# Patient Record
Sex: Female | Born: 1970 | Race: White | Hispanic: No | Smoking: Former smoker
Health system: Southern US, Community
[De-identification: ages and names within clinical notes are randomized; demographics above are authoritative.]

## PROBLEM LIST (undated history)

## (undated) DIAGNOSIS — G1221 Amyotrophic lateral sclerosis: Secondary | ICD-10-CM

## (undated) DIAGNOSIS — Z931 Gastrostomy status: Secondary | ICD-10-CM

## (undated) DIAGNOSIS — Z930 Tracheostomy status: Secondary | ICD-10-CM

---

## 2006-02-09 ENCOUNTER — Ambulatory Visit: Payer: Self-pay | Admitting: Family Medicine

## 2006-04-04 ENCOUNTER — Ambulatory Visit: Payer: Self-pay | Admitting: Obstetrics and Gynecology

## 2006-04-04 ENCOUNTER — Other Ambulatory Visit: Payer: Self-pay

## 2006-04-04 IMAGING — CR DG CHEST 2V
1 series · 2 of 2 positions shown · non-contrast
Comparison: none

REASON FOR EXAM: Altered mental status
COMMENTS:

PROCEDURE:     DXR - DXR CHEST PA (OR AP) AND LATERAL  - [DATE] [DATE]
RESULT:     COPD changes and a pectus excavatum deformity are noted. There
is no focal infiltrate evident. There is no effusion or pneumothorax.

[Series 1: view not recorded · 0.17mm/px · 2 of 2 slices shown]
[im 1/2]
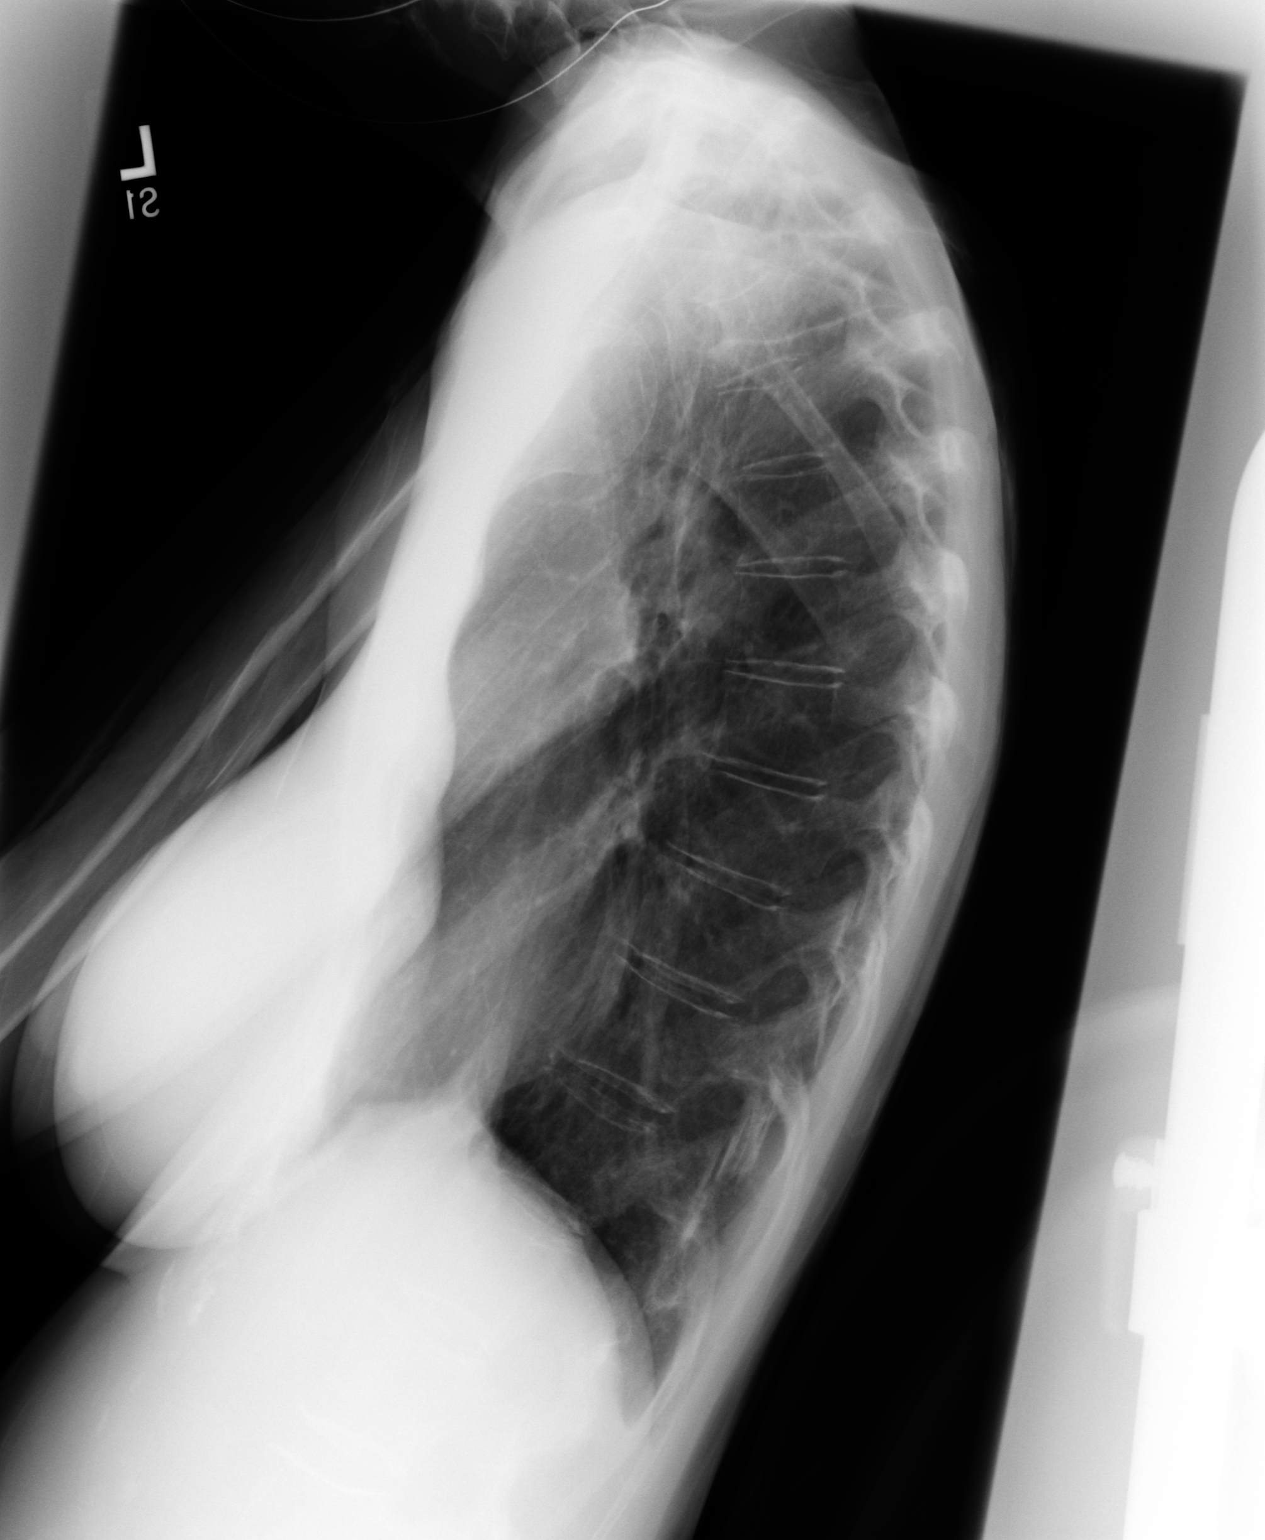
[im 2/2]
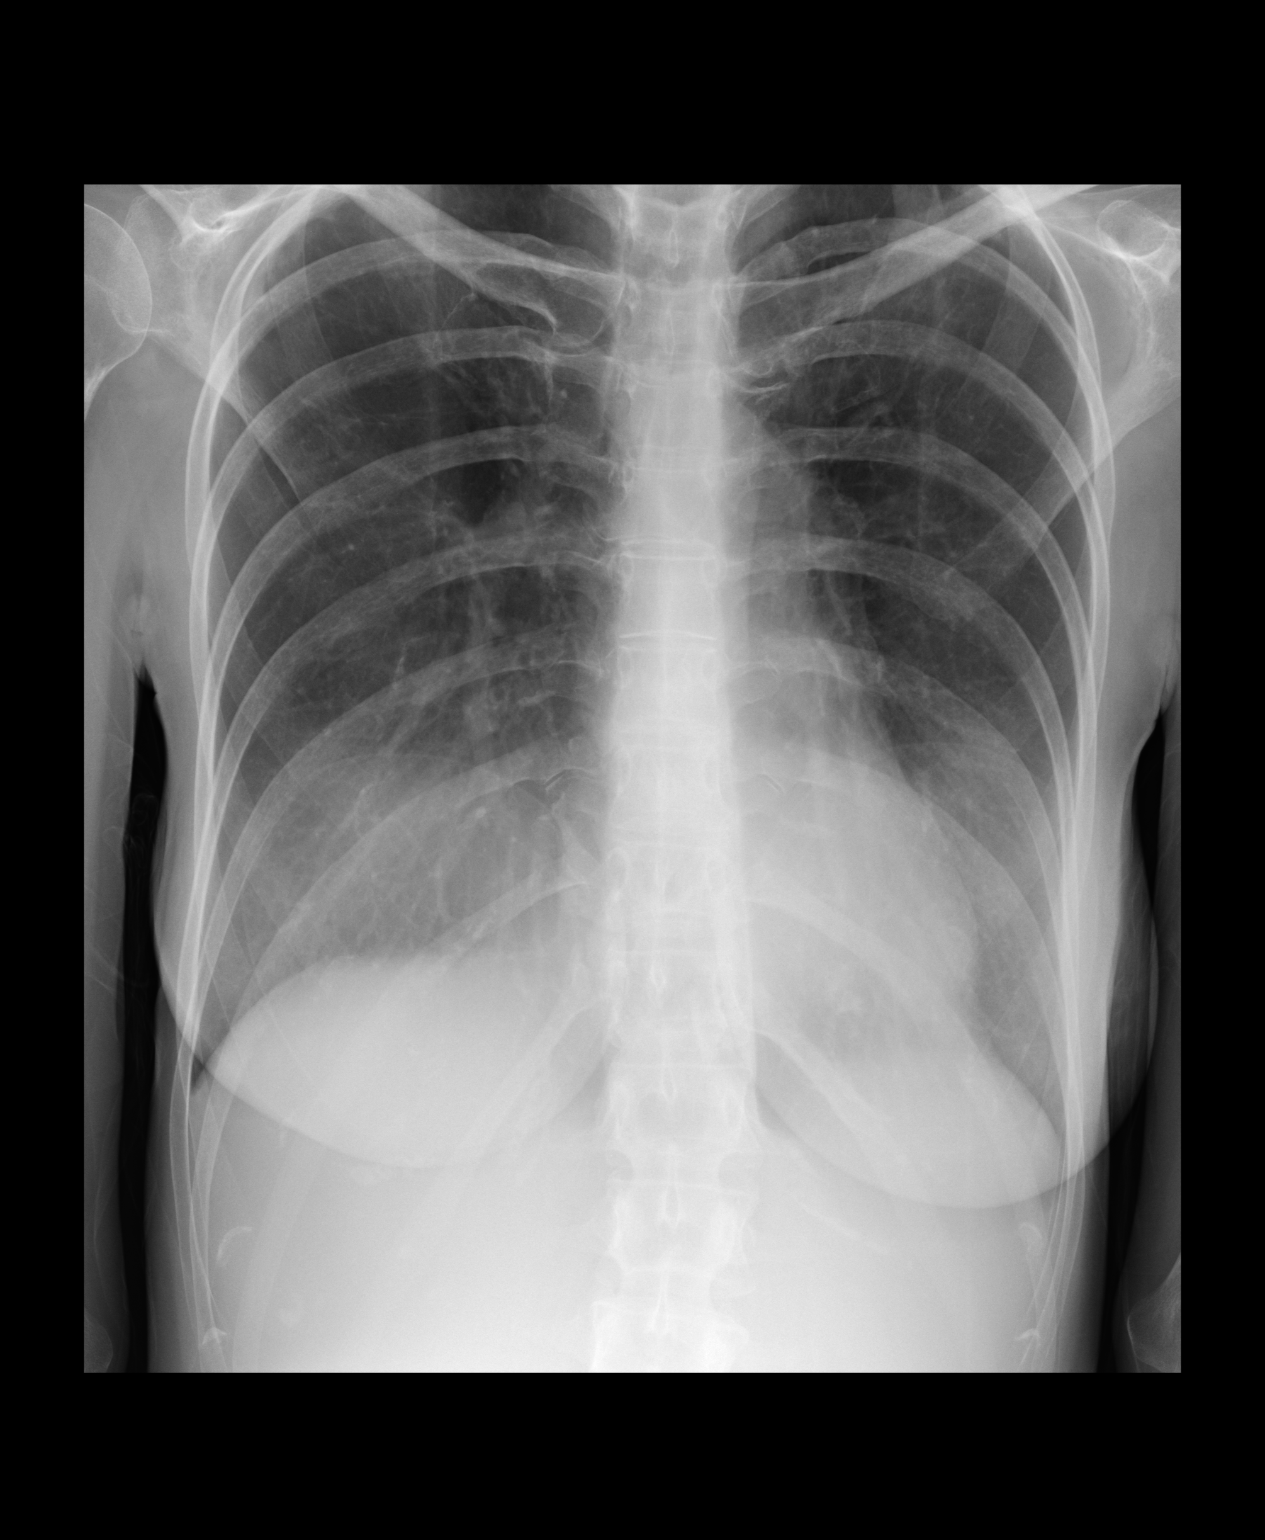

[2 of 2 positions shown; findings below may reference images not displayed]

IMPRESSION: 1.     Changes secondary to COPD and pectus excavatum deformity noted.
2.     There appears to be a calcified rim of a RIGHT breast implant as well
as possible LEFT breast implant demonstrated.

Thank you for this opportunity to contribute to the care of your patient.

ADDENDUM: [DATE]

Reason for exam in above report should read: ALS.

## 2006-04-13 ENCOUNTER — Ambulatory Visit: Payer: Self-pay | Admitting: Obstetrics and Gynecology

## 2008-09-23 ENCOUNTER — Inpatient Hospital Stay: Payer: Self-pay | Admitting: Internal Medicine

## 2010-05-04 ENCOUNTER — Ambulatory Visit: Payer: Self-pay | Admitting: Family Medicine

## 2010-06-30 ENCOUNTER — Emergency Department: Payer: Self-pay | Admitting: Emergency Medicine

## 2013-04-10 ENCOUNTER — Ambulatory Visit: Payer: Self-pay | Admitting: Internal Medicine

## 2013-05-01 ENCOUNTER — Inpatient Hospital Stay: Payer: Self-pay | Admitting: Student

## 2013-05-01 LAB — URINALYSIS, COMPLETE
Blood: NEGATIVE
Glucose,UR: NEGATIVE mg/dL (ref 0–75)
Leukocyte Esterase: NEGATIVE
Nitrite: NEGATIVE
Protein: 30
RBC,UR: 5 /HPF (ref 0–5)

## 2013-05-01 LAB — CBC
HGB: 15.3 g/dL (ref 12.0–16.0)
MCHC: 32.3 g/dL (ref 32.0–36.0)
MCV: 98 fL (ref 80–100)
Platelet: 225 10*3/uL (ref 150–440)
RDW: 12.3 % (ref 11.5–14.5)
WBC: 18.2 10*3/uL — ABNORMAL HIGH (ref 3.6–11.0)

## 2013-05-01 LAB — COMPREHENSIVE METABOLIC PANEL
Alkaline Phosphatase: 87 U/L (ref 50–136)
BUN: 17 mg/dL (ref 7–18)
Chloride: 99 mmol/L (ref 98–107)
Co2: 34 mmol/L — ABNORMAL HIGH (ref 21–32)
Creatinine: 0.29 mg/dL — ABNORMAL LOW (ref 0.60–1.30)
EGFR (African American): >60
Osmolality: 279 (ref 275–301)
Potassium: 4.3 mmol/L (ref 3.5–5.1)
SGOT(AST): 17 U/L (ref 15–37)
SGPT (ALT): 20 U/L (ref 12–78)
Sodium: 139 mmol/L (ref 136–145)
Total Protein: 8.3 g/dL — ABNORMAL HIGH (ref 6.4–8.2)

## 2013-05-02 LAB — CBC WITH DIFFERENTIAL/PLATELET
Bands: 9 %
HCT: 30.8 % — ABNORMAL LOW (ref 35.0–47.0)
HGB: 10.4 g/dL — ABNORMAL LOW (ref 12.0–16.0)
Lymphocytes: 18 %
MCH: 32.5 pg (ref 26.0–34.0)
MCHC: 33.9 g/dL (ref 32.0–36.0)
Metamyelocyte: 1 %
Monocytes: 1 %
RBC: 3.21 10*6/uL — ABNORMAL LOW (ref 3.80–5.20)
RDW: 12.1 % (ref 11.5–14.5)

## 2013-05-02 LAB — BASIC METABOLIC PANEL
Calcium, Total: 7.3 mg/dL — ABNORMAL LOW (ref 8.5–10.1)
Co2: 29 mmol/L (ref 21–32)
EGFR (African American): >60
EGFR (Non-African Amer.): >60
Glucose: 120 mg/dL — ABNORMAL HIGH (ref 65–99)
Osmolality: 291 (ref 275–301)
Potassium: 3.2 mmol/L — ABNORMAL LOW (ref 3.5–5.1)
Sodium: 145 mmol/L (ref 136–145)

## 2013-05-02 LAB — POTASSIUM: Potassium: 3.2 mmol/L — ABNORMAL LOW (ref 3.5–5.1)

## 2013-05-02 LAB — MAGNESIUM: Magnesium: 1.3 mg/dL — ABNORMAL LOW

## 2013-05-03 LAB — POTASSIUM: Potassium: 4 mmol/L (ref 3.5–5.1)

## 2013-05-03 LAB — MAGNESIUM: Magnesium: 2 mg/dL

## 2013-05-04 LAB — CBC WITH DIFFERENTIAL/PLATELET
Basophil %: 0.5 %
Eosinophil %: 0.8 %
HCT: 26.2 % — ABNORMAL LOW (ref 35.0–47.0)
HGB: 8.7 g/dL — ABNORMAL LOW (ref 12.0–16.0)
Lymphocyte %: 14.9 %
MCH: 31.6 pg (ref 26.0–34.0)
MCV: 96 fL (ref 80–100)
Monocyte #: 0.7 x10 3/mm (ref 0.2–0.9)
Monocyte %: 5.6 %
Neutrophil %: 78.2 %
Platelet: 181 10*3/uL (ref 150–440)
WBC: 12.8 10*3/uL — ABNORMAL HIGH (ref 3.6–11.0)

## 2013-05-04 LAB — BASIC METABOLIC PANEL
Anion Gap: 7 (ref 7–16)
BUN: 4 mg/dL — ABNORMAL LOW (ref 7–18)
Calcium, Total: 7.3 mg/dL — ABNORMAL LOW (ref 8.5–10.1)
Co2: 24 mmol/L (ref 21–32)
Creatinine: <0.1 mg/dL — ABNORMAL LOW (ref 0.60–1.30)
Glucose: 68 mg/dL (ref 65–99)
Osmolality: 282 (ref 275–301)
Sodium: 144 mmol/L (ref 136–145)

## 2013-05-05 LAB — URINALYSIS, COMPLETE
Bilirubin,UR: NEGATIVE
Blood: NEGATIVE
Glucose,UR: 50 mg/dL (ref 0–75)
Nitrite: NEGATIVE
Ph: 5 (ref 4.5–8.0)
Protein: NEGATIVE
RBC,UR: 4 /HPF (ref 0–5)
Squamous Epithelial: NONE SEEN

## 2013-05-05 LAB — CBC WITH DIFFERENTIAL/PLATELET
HCT: 26.1 % — ABNORMAL LOW (ref 35.0–47.0)
HGB: 9 g/dL — ABNORMAL LOW (ref 12.0–16.0)
Lymphocyte %: 20 %
MCHC: 34.4 g/dL (ref 32.0–36.0)
MCV: 95 fL (ref 80–100)
Monocyte #: 0.9 x10 3/mm (ref 0.2–0.9)
Neutrophil #: 8.2 10*3/uL — ABNORMAL HIGH (ref 1.4–6.5)
Neutrophil %: 70 %
Platelet: 228 10*3/uL (ref 150–440)
RBC: 2.76 10*6/uL — ABNORMAL LOW (ref 3.80–5.20)
RDW: 12.5 % (ref 11.5–14.5)
WBC: 11.8 10*3/uL — ABNORMAL HIGH (ref 3.6–11.0)

## 2013-05-05 LAB — BASIC METABOLIC PANEL
Calcium, Total: 7.4 mg/dL — ABNORMAL LOW (ref 8.5–10.1)
Chloride: 113 mmol/L — ABNORMAL HIGH (ref 98–107)
Co2: 23 mmol/L (ref 21–32)
Sodium: 146 mmol/L — ABNORMAL HIGH (ref 136–145)

## 2013-05-05 LAB — MAGNESIUM: Magnesium: 1.9 mg/dL

## 2013-05-05 LAB — PHOSPHORUS: Phosphorus: 2.7 mg/dL (ref 2.5–4.9)

## 2013-05-06 LAB — CBC WITH DIFFERENTIAL/PLATELET
Basophil #: 0 10*3/uL (ref 0.0–0.1)
Basophil %: 0.1 %
Eosinophil #: 0.4 10*3/uL (ref 0.0–0.7)
HCT: 28 % — ABNORMAL LOW (ref 35.0–47.0)
Lymphocyte #: 1.7 10*3/uL (ref 1.0–3.6)
Lymphocyte %: 13.6 %
MCHC: 33.6 g/dL (ref 32.0–36.0)
Monocyte %: 9.3 %
Neutrophil #: 9.5 10*3/uL — ABNORMAL HIGH (ref 1.4–6.5)
Neutrophil %: 74.2 %
Platelet: 306 10*3/uL (ref 150–440)
RBC: 2.96 10*6/uL — ABNORMAL LOW (ref 3.80–5.20)
RDW: 12.5 % (ref 11.5–14.5)
WBC: 12.8 10*3/uL — ABNORMAL HIGH (ref 3.6–11.0)

## 2013-05-06 LAB — BASIC METABOLIC PANEL
BUN: 3 mg/dL — ABNORMAL LOW (ref 7–18)
Calcium, Total: 7.6 mg/dL — ABNORMAL LOW (ref 8.5–10.1)
Glucose: 156 mg/dL — ABNORMAL HIGH (ref 65–99)
Osmolality: 283 (ref 275–301)
Sodium: 142 mmol/L (ref 136–145)

## 2013-05-06 LAB — MAGNESIUM: Magnesium: 1.6 mg/dL — ABNORMAL LOW

## 2013-05-06 LAB — ALBUMIN: Albumin: 1.7 g/dL — ABNORMAL LOW (ref 3.4–5.0)

## 2013-05-06 LAB — PHOSPHORUS: Phosphorus: 2.2 mg/dL — ABNORMAL LOW (ref 2.5–4.9)

## 2013-05-07 LAB — CBC WITH DIFFERENTIAL/PLATELET
Basophil %: 0.2 %
Eosinophil #: 0.2 10*3/uL (ref 0.0–0.7)
Eosinophil %: 0.9 %
HCT: 26.9 % — ABNORMAL LOW (ref 35.0–47.0)
Lymphocyte #: 2.1 10*3/uL (ref 1.0–3.6)
Lymphocyte %: 12 %
MCH: 31.6 pg (ref 26.0–34.0)
Monocyte %: 13.5 %
Neutrophil #: 12.6 10*3/uL — ABNORMAL HIGH (ref 1.4–6.5)
RBC: 2.85 10*6/uL — ABNORMAL LOW (ref 3.80–5.20)

## 2013-05-07 LAB — VANCOMYCIN, TROUGH: Vancomycin, Trough: 7 ug/mL — ABNORMAL LOW (ref 10–20)

## 2013-05-07 LAB — COMPREHENSIVE METABOLIC PANEL
Alkaline Phosphatase: 64 U/L (ref 50–136)
Anion Gap: 2 — ABNORMAL LOW (ref 7–16)
BUN: 10 mg/dL (ref 7–18)
Calcium, Total: 8.2 mg/dL — ABNORMAL LOW (ref 8.5–10.1)
Chloride: 105 mmol/L (ref 98–107)
Glucose: 221 mg/dL — ABNORMAL HIGH (ref 65–99)
Osmolality: 283 (ref 275–301)
Sodium: 139 mmol/L (ref 136–145)
Total Protein: 5.6 g/dL — ABNORMAL LOW (ref 6.4–8.2)

## 2013-05-07 LAB — MAGNESIUM: Magnesium: 1.9 mg/dL

## 2013-05-07 LAB — PHOSPHORUS
Phosphorus: 2.6 mg/dL (ref 2.5–4.9)
Phosphorus: 2.8 mg/dL (ref 2.5–4.9)

## 2013-05-07 LAB — CULTURE, BLOOD (SINGLE)

## 2013-05-07 LAB — POTASSIUM: Potassium: 4 mmol/L (ref 3.5–5.1)

## 2013-05-10 LAB — EXPECTORATED SPUTUM ASSESSMENT W REFEX TO RESP CULTURE

## 2013-05-11 ENCOUNTER — Ambulatory Visit: Payer: Self-pay | Admitting: Internal Medicine

## 2013-06-08 ENCOUNTER — Ambulatory Visit: Payer: Self-pay

## 2014-04-01 ENCOUNTER — Emergency Department: Payer: Self-pay | Admitting: Emergency Medicine

## 2014-04-01 LAB — BASIC METABOLIC PANEL
Anion Gap: 6 — ABNORMAL LOW (ref 7–16)
BUN: 11 mg/dL (ref 7–18)
CO2: 25 mmol/L (ref 21–32)
CREATININE: 0.33 mg/dL — AB (ref 0.60–1.30)
Calcium, Total: 9.2 mg/dL (ref 8.5–10.1)
Chloride: 110 mmol/L — ABNORMAL HIGH (ref 98–107)
EGFR (African American): >60
GLUCOSE: 88 mg/dL (ref 65–99)
Osmolality: 280 (ref 275–301)
POTASSIUM: 4 mmol/L (ref 3.5–5.1)
Sodium: 141 mmol/L (ref 136–145)

## 2014-04-01 LAB — CBC
HCT: 35.6 % (ref 35.0–47.0)
HGB: 11.4 g/dL — ABNORMAL LOW (ref 12.0–16.0)
MCH: 26.6 pg (ref 26.0–34.0)
MCHC: 32.1 g/dL (ref 32.0–36.0)
MCV: 83 fL (ref 80–100)
Platelet: 387 10*3/uL (ref 150–440)
RBC: 4.3 10*6/uL (ref 3.80–5.20)
RDW: 21.5 % — ABNORMAL HIGH (ref 11.5–14.5)
WBC: 11.2 10*3/uL — ABNORMAL HIGH (ref 3.6–11.0)

## 2014-08-04 ENCOUNTER — Other Ambulatory Visit: Payer: Self-pay

## 2014-08-10 LAB — WOUND CULTURE

## 2015-04-02 NOTE — H&P (Signed)
PATIENT NAME:  Brenda Anderson, LENHARDT MR#:  [id number] DATE OF BIRTH:  1971-12-09  DATE OF ADMISSION:  05/01/2013  PRIMARY CARE PHYSICIAN:  Dr. Dorothey Baseman   REFERRING PHYSICIAN:  Dr. Manson Passey   CHIEF COMPLAINT:  Epigastric pain radiating to the back and shortness of breath.   HISTORY OF PRESENT ILLNESS:  The patient is a 44 year old Caucasian female with past medical history of amyotrophic lateral sclerosis who is bedbound recently with worsening of symptoms of ALS. She is presenting to the ER with a chief complaint of epigastric abdominal pain and also radiating to the back pain.  According to the patient's mom who is at bedside, the patient has been nauseated for two days and feeling extremely weak associated with epigastric pain and back pain today.  The patient has received IV morphine for getting CT of the chest and abdomen by the ER physician, following which the patient became quite lethargic.  I was unable to get any history from the patient as she has just received IV morphine.  History is obtained from the old medical records and from the mom's history at bedside.  Lately her ALS has been getting worse and patient became hypoxemic last night.  The patient was sating at 86% on room air and was placed on high flow oxygen via nasal cannula and at 5 liters patient was sating at 96%.  CAT scan of the chest and abdomen has revealed small right-sided pleural effusion with adjacent atelectasis or consolidation.  As patient's white count is elevated, she was given IV antibiotics for possible aspiration pneumonia.      PAST MEDICAL HISTORY: Amyotrophic lateral sclerosis, depression, allergic rhinitis, migraine.   PAST SURGICAL HISTORY: C-section, adenoidectomy, tubal ligation, endometrial ablation, appendectomy.   ALLERGIES:  She is allergic to CIPRO.   PSYCHOSOCIAL HISTORY:  Lives at home with her mom and two children.  She got separated.  She used to smoke, but quit smoking several years ago.  No  history of alcohol or drugs.   FAMILY HISTORY:  Father had heart disease in late 74s.  Mother and grandmother had osteoporosis.  Paternal grandmother had diabetes.   REVIEW OF SYSTEMS:  CONSTITUTIONAL:  No fever, fatigue.  No weight loss, weight gain.  EYES:  No blurry vision, glaucoma, cataracts.  EARS, NOSE, THROAT:  No epistaxis, discharge, postnasal drip, sinus pain.  RESPIRATIONS:  No wheezing, hemoptysis, but positive shortness of breath.  CARDIOVASCULAR:  No chest pain.  No palpitations, orthopnea, edema or syncope.  GASTROINTESTINAL:  Status post nausea and vomiting for the past two days.  No history of diarrhea.  No abdominal pain, hematemesis or melena.  No rectal bleeding or constipation.  GENITOURINARY:  No dysuria or hematuria.  No frequent incontinence.  GYNECOLOGIC AND BREASTS:  No breast mass, vaginal discharge.  ENDOCRINE:  No polyuria, nocturia, thyroid problems.  HEMATOLOGIC AND LYMPHATIC:  No anemia, easy bruising, bleeding.  MUSCULOSKELETAL:  No joint pain in the neck, back, shoulder. NEUROLOGIC:  Denies any numbness, weakness or vertigo.  Denies any ataxia or dementia.  PSYCHIATRIC:  No history of insomnia, ADD or OCD.   PHYSICAL EXAMINATION: VITAL SIGNS:  Respirations 18, blood pressure 114/80, pulse ox 95% on 4 to 5 liters, pulse 118 per minute.  GENERAL APPEARANCE:  Not in acute distress.  Moderately built and thin-looking.  The patient is quite lethargic as she has received IV morphine for her CAT scan. HEENT:  Normocephalic, atraumatic.  No sinus tenderness.  No postnasal drip.  No  pharyngeal exudates.  NECK:  Supple.  No JVD.  No thyromegaly.  LUNGS:  Moderate air entry.  No crackles.  Positive rales and rhonchi in the right lung base.   CARDIAC:  S1, S2, normal.  Positive murmur.  GASTROINTESTINAL:  Soft.  Bowel sounds are positive in all four quadrants.  Nontender, nondistended.  No masses felt.  No hepatosplenomegaly.  Recently replaced, the G-tube is intact.    NEUROLOGIC:  The patient is very lethargic as she has just received morphine in the IV form.  Arousable to deep sternal rub or verbal commands or painful stimuli.  SKIN:  Warm to touch and dry in nature.  No lesions.  No rashes.  EXTREMITIES:  No edema.  No cyanosis.  No clubbing.  No joint effusion. MUSCULOSKELETAL:  No joint effusion, tenderness, erythema.   LABORATORY DATA AND IMAGING STUDIES:  BUN 17, creatinine 0.1, sodium 139, potassium 4.3, chloride 99, GFR greater than 60, anion gap 6, serum osmolality 279, calcium 9.3, total protein 8.3, albumin 3.6, bilirubin total 0.4, alkaline phosphatase 87, ALT and AST are within normal range.  WBC 18.2, hemoglobin 15.3, hematocrit 47.4, platelets 225,000.  CT of the chest and abdomen has revealed normal-sized heart; severe diverticulosis is present, small right-sided pleural effusion with adjacent atelectasis versus consolidation, a small amount of indeterminate perihepatic fluid.  No pulmonary embolism.   ASSESSMENT AND PLAN: 1.  Shortness of breath with hypoxemia, probably from aspiration pneumonia.  We will admit the patient to telemetry.  We will provide broad-spectrum antibiotics for possible aspiration pneumonia with Zosyn and vancomycin, oxygen via nasal cannula to keep the sats at greater than 93%.  2.  Amyotrophic lateral sclerosis.  The patient has gastric tube. Will keep th patient NPO 3.  Depression, stable.  No suicidal or homicidal ideation.   4.  Possible aspiration pneumonia, regarding which we will keep the patient nothing by mouth and we will get a bedside swallow evaluation by the speech pathologist.   5.  We will obtain sputum culture and urine culture and sensitivity.  6.  Regarding the patient's mom, she is a FULL CODE.  Mom is her medical POA.   Total time spent on admission is 55 minutes.    ____________________________ Ramonita LabAruna Boston Catarino, MD ag:ea D: 05/01/2013 04:06:22 ET T: 05/01/2013 05:30:45 ET JOB#: [id number]  cc: Ramonita LabAruna  Benny Deutschman, MD, <Dictator> Ramonita LabARUNA Vance Hochmuth MD ELECTRONICALLY SIGNED 05/10/2013 6:44

## 2015-04-02 NOTE — Discharge Summary (Signed)
PATIENT NAME:  Brenda Anderson, Brenda Anderson MR#:  [id number] DATE OF BIRTH:  May 10, 1971  DATE OF ADMISSION:  05/01/2013 DATE OF DISCHARGE:  05/07/2013  [facility]   For History of Present Illness pertaining to admission and hospitalization until May 27, please see the H and P dictated on May 22 and interim discharge summary dictated on the 27th.  CONSULTATIONS:   1.  Dr. Harvie JuniorPhifer from palliative care.  2.  Dr. Freda MunroSaadat Khan and Dr. Belia HemanKasa from pulmonary.  3.  Dr. Michela PitcherEly blood and Dr. Egbert GaribaldiBird from general surgery.  4.  Dr. Wyn Quakerew from vascular surgery.   DISCHARGE DIAGNOSES: 1.  Status post gastric ulcer perforation peritonitis.  2.  Status post laparotomy.  3.  Respiratory failure.  4.  Amyotrophic lateral sclerosis.   5.  Aspiration pneumonia on admission, complicated by ventilator-associated pneumonia.  6.  Septic shock.  7.  Hypokalemia.  8.  Hypoglycemia.  9.  Hyponatremia.  10.  Acute blood loss anemia.  11.  Severe debility due to amyotrophic lateral sclerosis.   12.  Failure of breathing off the vent.   CURRENT MEDICATIONS: Pantoprazole 40 mg IV daily, Zosyn 3.375 grams IV every 8 hours, Lovenox 40 mg subcutaneous daily, Combivent oral inhaler 8 puffs q. 4 hours, Flovent  HFA 220 mcg inhaled 2 puffs q. 12 hours, scopolamine patch, insulin drip, Precedex drip, Fentanyl drip, Levophed currently at 16 mcg, TPN.   LABORATORY, DIAGNOSTIC AND RADIOLOGIC DATA: Today's labs: Glucose was 221, 0.32, BUN 10, sodium 139, potassium 4. LFTs showed albumin of 1.7, AST 44, total protein 5.6. WBC is up trending, today at 17.2 and on the 27th was 12.8, hemoglobin 9, platelets 471. Vanc level early this morning was 7.   X-ray of the chest, portable today, shows persistent bilateral infiltrates and diffuse osteopenia.   HOSPITAL COURSE: This morning the patient did not have a fever, but remains tachycardic, 120s to 130s and hypotensive. She was on Levophed of 14 mcg. She is sedated. I  had a lengthy conversation with the patient's mother describing patient's shock, persistent respiratory failure and inability to wean off of the vent. The patient is on triple antibiotic therapy to cover ventilator-associated pneumonia. This should also cover possible peritonitis. The [facility] at this point [facility] as the patient's primary surgeon and neurologist are located there. The case was discussed with Dr. Katrine CohoGovert, who accepted the patient. [facility]. The patient overall has a very poor prognosis and likely unweanable from the vent and this was explained that the mother, who understands the situation.      TOTAL TIME SPENT: 35 minutes.   CODE STATUS: The patient is full code.   ____________________________ Krystal EatonShayiq Raylen Ken, MD sa:cc D: 05/07/2013 15:19:00 ET T: 05/07/2013 16:12:33 ET JOB#: [id number]  cc: Krystal EatonShayiq Antanasia Kaczynski, MD, <Dictator> Krystal EatonSHAYIQ Marna Weniger MD ELECTRONICALLY SIGNED 05/13/2013 20:06

## 2015-04-02 NOTE — Op Note (Signed)
PATIENT NAME:  Brenda Anderson, Brenda Anderson MR#:  [id number] DATE OF BIRTH:  12/21/1970  DATE OF PROCEDURE:  05/01/2013  PREOPERATIVE DIAGNOSIS: Acute abdomen, sepsis, perforated posterior duodenal ulcer second portion.   POSTOPERATIVE DIAGNOSIS: Acute abdomen, sepsis, perforated posterior duodenal ulcer second portion.    PROCEDURES PERFORMED: 1.  Exploratory laparotomy.  2.  Peritoneal lavage.  3.  Modified Graham patch closure of duodenal ulcer.   SURGEON: Natale LayMark Jaquanda Wickersham, MD   ASSISTANT: Ida Roguehristopher Lundquist, MD   FINDINGS: As described above.   ESTIMATED BLOOD LOSS: Minimal.   DRAINS:  1.  Foley catheter.  2.  A 19 mm Blake drain in Morison's pouch.   DESCRIPTION OF PROCEDURE: With informed consent obtained from the family, the patient was brought urgently to the operating room and positioned supine. General oral endotracheal anesthesia was induced. The patient's abdomen was widely prepped and draped with ChloraPrep solution. Timeout was observed. The abdomen was entered through a midline skin incision. A self-retaining abdominal wall retractor was placed. Generous Kocher maneuver was performed including the right kidney. There appeared to be a contained perforation within the second portion of the posterior wall of the duodenum measuring approximately 3 mm. There was a small amount of bilious staining in the retroperitoneum. This was irrigated thoroughly. There did not appear to be a large ulcer involved in this area. There was no active bleeding. The ulcer was closed with seromuscular 3-0 silk suture. A modified Graham patch was then performed by rotating adjacent retroperitoneal fat from Gerota's fascia over onto the ulcer site. This was secured in place with loosely-tied 3-0 silk sutures. An additional tongue of omentum was taken off the transverse colon and sutured in place. The abdomen was irrigated with warm normal saline and aspirated dry. Hemostasis on Gerota's fascia was obtained with point  cautery and the application of Surgicel. A 19 mm Blake drain was directed into the space and exited the right lateral abdomen. The drain site was secured with nylon suture. With lap and needle count correct x 2, the fascia was closed from the extremes of the wounds with a #1 Vicryl running with interrupted #1 Vicryls as internal retention sutures. The G-tube was secured to the anterior abdominal wall and flange. It would appear to be intact. Skin edges were reapproximated utilizing a skin stapler. Sterile occlusive dressing was placed. The patient was then subsequently taken to the recovery room in stable condition on a ventilatory circuit.    ____________________________ Redge GainerMark A. Egbert GaribaldiBird, MD mab:cb D: 05/01/2013 17:49:30 ET T: 05/01/2013 20:26:03 ET JOB#: [id number]  cc: Loraine LericheMark A. Egbert GaribaldiBird, MD, <Dictator> Raynald KempMARK A Kajuan Guyton MD ELECTRONICALLY SIGNED 05/07/2013 12:18

## 2015-04-02 NOTE — Consult Note (Signed)
CHIEF COMPLAINT and HISTORY:  Subjective/Chief Complaint SOB, poor access   History of Present Illness 44 yo WF with ALS who is admitted with hypoxia and aspiration pneumonia.  She is on BiPAP.  She has only one small peripheral IV and is on multiple IV medicines.  We are asked to place a line   PAST MEDICAL/SURGICAL HISTORY:  Past Medical History:   depression:    ALS:    appendectomy:    tonsilectomy:    breast   saline implants:   ALLERGIES:  Allergies:  Cipro: N/V  HOME MEDICATIONS:  Home Medications: Medication Instructions Status  K-Dur 10 oral tablet, extended release 1  orally once a day  Active  scopolamine 1.5 mg transdermal film, extended release 1  transdermal every 3 days  Active  mirtazapine 15 mg oral tablet 1  orally once a day (at bedtime)  Active  Senna S tablet 50 mg-8.6 mg 2 tab(s) orally once a day (at bedtime)  Active   Family and Social History:  Family History Coronary Artery Disease   Social History positive tobacco (Greater than 1 year), negative ETOH   Place of Living Home   Review of Systems:  ROS Pt not able to provide ROS  very short of breath on BiPAP   Medications/Allergies Reviewed Medications/Allergies reviewed   Physical Exam:  GEN cachectic, critically ill appearing   HEENT PERRL, dry oral mucosa   NECK No masses  trachea midline   RESP postive use of accessory muscles  rhonchi   CARD regular rate  no JVD   ABD denies tenderness  normal BS   GU foley catheter in place  clear yellow urine draining   LYMPH negative neck, negative axillae   EXTR negative cyanosis/clubbing, negative edema   SKIN No ulcers, skin turgor poor   NEURO generalized weakness present   PSYCH difficult to assess as is on BiPAP, SOB, and critically ill   LABS:  Laboratory Results: Hepatic:    22-May-14 00:38, Comprehensive Metabolic Panel  Bilirubin, Total 0.4  Alkaline Phosphatase 87  SGPT (ALT) 20  SGOT (AST) 17  Total Protein,  Serum 8.3  Albumin, Serum 3.6  Lab:    22-May-14 07:00, ABG  pH (ABG) 7.05  PCO2 120  PO2 81  FiO2 32  O2 Saturation 97.3  Specimen Site (ABG)   RT RADIAL  Specimen Type (ABG) ARTERIAL  Patient Temp (ABG) 37.0    22-May-14 08:05, ABG  pH (ABG) 7.14  PCO2 105  PO2 157  FiO2 50  Base Excess 3.1  HCO3 35.7  O2 Saturation 98.8  O2 Device BIPAP  Specimen Site (ABG)   RT RADIAL  Specimen Type (ABG) ARTERIAL  Patient Temp (ABG) 37.0  PSV 16  PEEP 5.0  Mechanical Rate 12  Routine Chem:    22-May-14 00:38, Comprehensive Metabolic Panel  Glucose, Serum 99  BUN 17  Creatinine (comp) 0.29  Sodium, Serum 139  Potassium, Serum 4.3  Chloride, Serum 99  CO2, Serum 34  Calcium (Total), Serum 9.3  Osmolality (calc) 279  eGFR (African American) >60  eGFR (Non-African American) >60  eGFR values <62m/min/1.73 m2 may be an indication of chronic  kidney disease (CKD).  Calculated eGFR is useful in patients with stable renal function.  The eGFR calculation will not be reliable in acutely ill patients  when serum creatinine is changing rapidly. It is not useful in   patients on dialysis. The eGFR calculation may not be applicable  to patients at the  low and high extremes of body sizes, pregnant  women, and vegetarians.  Anion Gap 6    22-May-14 07:00, ABG  Result Comment   - NOTIFIED OF CRITICAL VALUE   - READ-BACK PROCESS PERFORMED.   - dr Margaretmary Eddy, [phone], 05/01/2013   Result(s) reported on 01 May 2013 at 07:13AM.    22-May-14 08:05, ABG  Result Comment   - NOTIFIED OF CRITICAL VALUE   - READ-BACK PROCESS PERFORMED.   - to Dr Laurence Slate 05/01/13 0830   Result(s) reported on 01 May 2013 at 08:20AM.  Routine UA:    22-May-14 03:55, Urinalysis  Color (UA) Yellow  Clarity (UA) Hazy  Glucose (UA) Negative  Bilirubin (UA) Negative  Ketones (UA) 1+  Specific Gravity (UA) 1.015  Blood (UA) Negative  pH (UA) 5.0  Protein (UA) 30 mg/dL  Nitrite (UA) Negative  Leukocyte Esterase (UA)  Negative  Result(s) reported on 01 May 2013 at 04:41AM.  RBC (UA) 5 /HPF  WBC (UA) 3 /HPF  Bacteria (UA) TRACE  Epithelial Cells (UA) 12 /HPF  Mucous (UA) PRESENT  Result(s) reported on 01 May 2013 at 04:41AM.  Routine Hem:    22-May-14 00:38, Hemogram, Platelet Count  WBC (CBC) 18.2  RBC (CBC) 4.84  Hemoglobin (CBC) 15.3  Hematocrit (CBC) 47.4  Platelet Count (CBC) 225  Result(s) reported on 01 May 2013 at 12:47AM.  MCV 98  MCH 31.6  MCHC 32.3  RDW 12.3   RADIOLOGY:  Radiology Results: XRay:    22-May-14 00:25, Abdomen AP Only  Abdomen AP Only  REASON FOR EXAM:    g tube placement  COMMENTS:       PROCEDURE: DXR - DXR ABDOMEN AP ONLY  - May 01 2013 12:25AM     RESULT:  Comparison: 06/30/2010    Findings:  Contrast material is seen within the second and third segment of the   duodenum. The bulb of the G-tube appears to be in the region of the   gastric antrum. There is a small amount of contrast material just lateral   to the second segment of the duodenum which does not appear to be   intraluminal, and there appears to be some extraluminal contrast material   and air in this location on the subsequent CT. High density material   within the colon may be related to ingested material perihilar     diverticulosis of the colon. There is a levocurvature of the   thoracolumbar spine.    IMPRESSION:    The majority of the contrast material is seen within the duodenum.   However, there are findings concerning for small amount of extraluminal   extravasation of contrast material. Please see CT already performed.   Surgery consultation is suggested.    This was called to Dr. Phillips Climes, the hospitalist covering this   patient, at [place] hours 05/01/2013.    Dictation site: 2      Verified By: Gregor Hams, M.D., MD    22-May-14 00:25, Chest Portable Single View  Chest Portable Single View  REASON FOR EXAM:    difficulty breathing, low oxygen  saturation  COMMENTS:       PROCEDURE: DXR - DXR PORTABLE CHEST SINGLE VIEW  - May 01 2013 12:25AM     RESULT: Comparison: None.    Findings:  The heart and mediastinum are within normal limits given patient   positioning. There is mild biapical pleural-parenchymal thickening. Mild   elevation of the the right hemidiaphragm. There are  mild left basilar   opacities which may be secondary to atelectasis.    IMPRESSION:   Mild left basilar opacities may be secondary to atelectasis. Infection or     aspiration are not excluded.    Dictation site: 2        Verified By: Gregor Hams, M.D., MD  LabUnknown:    22-May-14 00:25, Abdomen AP Only  PACS Image    22-May-14 00:25, Chest Portable Single View  PACS Image    22-May-14 01:53, CT Chest, Abd, and Pelvis With Contrast  PACS Image  CT:  CT Chest, Abd, and Pelvis With Contrast  REASON FOR EXAM:    (1) chest abdomen pain history of recnt g tube   replacement O2 74%; (2) chest abd  COMMENTS:       PROCEDURE: CT  - CT CHEST ABDOMEN AND PELVIS W  - May 01 2013  1:53AM     RESULT: History: Abdominal pain. Recent G-tube placement.    Findings: Standard CT obtained under cc of Isovue-300. Bilateral breast   implants noted. Liver normal. Gallbladder nondistended. No biliary   distention. Pancreas normal. Spleen normal. Kidneys normal. Adrenals   normal aorta normal caliber. G-tube is noted with its tip in the antrum.   Noted along the lateral wall of the duodenum there is an air collection   with adjacent high density fluid most likely oral contrast. This could   represent a bowel leak. Mild perihepatic fluid noted. No bowel distention     prominent diverticulosis noted. Apical pleural-parenchymal changes noted   consistent with scarring, mild infiltrate right middle lobe and right   lung base. Thoracic aorta is unremarkable. No pulmonary emboli.   Thoracolumbar scoliosis noted. No acute bony abnormality. This report was    phoned to patient's patient's CCU nurse Eduard Clos) with instructions the   phone this report to the patient's CCU Dr. immediately.    IMPRESSION:    1. Small amount of air an probable contrast noted along the lateral   aspect of the second portion of the duodenum. This could be from bowel   perforation. Surgical consultation is suggested. Small amount of   perihepatic fluid is present.  2. Gastrostomy tube in good position.      Verified By: Osa Craver, M.D., MD   ASSESSMENT AND PLAN:  Assessment/Admission Diagnosis hypoxia aspiration pneumonia ALS   Plan We placed a line at bedisde.  Would not advance beyond about 15 cm, but at this level there was good blood return and flushed easily.   Electronic Signatures: Algernon Huxley (MD)  (Signed 22-May-14 09:36)  Authored: Chief Complaint and History, PAST MEDICAL/SURGICAL HISTORY, ALLERGIES, HOME MEDICATIONS, Family and Social History, Review of Systems, Physical Exam, LABS, RADIOLOGY, Assessment and Plan   Last Updated: 22-May-14 09:36 by Algernon Huxley (MD)

## 2015-04-02 NOTE — Consult Note (Signed)
   Comments   I spoke with pt in the presence of her mother and brother. Pt is awake, alert, oriented. I discussed the likely scenario of trach/long term vent-dependence/LTACH after sgy. Pt shakes her head that she does not want this. I then gave her the option of comfort care if she is vent-dependent post-op. She also indicates that she doesn't want this. She indicates that what she wants is to die at home. I once again gave her the choice of no sgy with trial of conservative mgmt but pt again confirms that she wants sgy.  spoke to mother and brother outside of pt's room. They would not consent to trach iin the event pt is vent-dependent and mother knows that she will likely have to make a decision to withdraw care on pt if this is the outcome. However, mother feels that pt is able to make the decision for sgy and wants to respect her wishes.   Electronic Signatures: Kinesha Auten, Harriett SineNancy (MD)  (Signed 22-May-14 15:28)  Authored: Palliative Care   Last Updated: 22-May-14 15:28 by Blayklee Mable, Harriett SineNancy (MD)

## 2015-04-02 NOTE — Op Note (Signed)
PATIENT NAME:  Brenda Anderson, Brenda Anderson MR#:  [id number] DATE OF BIRTH:  [birth year]  DATE OF PROCEDURE:  05/01/2013  PREOPERATIVE DIAGNOSES: 1.  Hypoxia.  2.  Aspiration pneumonia.  3.  Amyotrophic lateral sclerosis (ALS).  4. Malpositioned central venous catheter.   POSTOPERATIVE DIAGNOSES: 1.  Hypoxia.  2.  Aspiration pneumonia.  3.  Amyotrophic lateral sclerosis (ALS).  4. Malpositioned central venous catheter.   PROCEDURE: Fluoroscopic revision and replacement of central venous catheter.   SURGEON: Dew.   ANESTHESIA: Local.   BLOOD LOSS: Minimal.   INDICATION FOR PROCEDURE: A 44 year old white female who had a central venous catheter placed earlier today. The catheter tip was not in the superior vena cava. Instead, it was pointed upward and across into the right innominate vein from the left jugular approach. She was brought down to have this revised under fluoroscopy.   DESCRIPTION OF PROCEDURE: The patient's existing catheter and pericatheter area were sterilely prepped and draped and a sterile surgical field was created. An 0.025 wire was used to rewire the catheter. The catheter was pulled back and the wire was advanced under fluoroscopic guidance downward into the superior vena cava. The catheter was then advanced over the wire with the catheter tip parked at the cavoatrial junction and was secured to the skin at 19 cm with  3 Prolene sutures.     ____________________________ Annice NeedyJason S. Dew, MD jsd:dm D: 05/01/2013 12:40:10 ET T: 05/01/2013 14:15:17 ET JOB#: [id number]  cc: Annice NeedyJason S. Dew, MD, <Dictator> Annice NeedyJASON S DEW MD ELECTRONICALLY SIGNED 05/07/2013 13:53

## 2015-04-02 NOTE — Op Note (Signed)
PATIENT NAME:  Brenda BravoHENSLEY, Eryka L MR#:  [id number] DATE OF BIRTH:  Jul 05, 1971  DATE OF PROCEDURE:  05/01/2013  PREOPERATIVE DIAGNOSES:  1.  Aspiration pneumonia.  2.  Hypoxia requiring high-volume oxygen.  3.  Amyotrophic lateral sclerosis.   POSTOPERATIVE DIAGNOSES: 1.  Aspiration pneumonia.  2.  Hypoxia requiring high-volume oxygen.  3.  Amyotrophic lateral sclerosis.   PROCEDURES: 1.  Ultrasound guidance for vascular access to left jugular vein.  2.  Placement of a left jugular triple lumen catheter.   SURGEON:  Annice NeedyJason S Janat Tabbert, MD.  ANESTHESIA:  Local.   ESTIMATED BLOOD LOSS:  Minimal.   INDICATION FOR PROCEDURE:  A 44 year old white female with ALS who was admitted to the hospital with shortness of breath, hypoxia and an aspiration pneumonia. She has very poor venous access, requiring multiple intravenous medications, and we are asked to place a central line.   DESCRIPTION OF PROCEDURE:  The patient was laid flat in her critical care bed. Her left neck and chest were sterilely prepped and draped, and a sterile surgical field was created. The left jugular vein was visualized with ultrasound, it was felt to be patent in the neck. It was then accessed under direct ultrasound guidance without difficulty with a Seldinger needle. A J-wire was placed but would not pass beyond about 20 to 25 cm. Multiple attempts at redirecting this were unsuccessful. This included trying to redirect it over an angiocatheter and a dilator in the jugular vein for support. I then elected to place a triple-lumen catheter and secure it about 13 to 14 cm where it would withdraw blood and flushed easily with sterile saline and should the catheter tip be in an inadequate location, it could be repositioned under fluoroscopy later today. This secured at this level at about 14 cm with 3 silk sutures and a stat chest x-ray is pending.   ____________________________ Annice NeedyJason S. Dalana Pfahler, MD jsd:jm D: 05/01/2013 09:57:58  ET T: 05/01/2013 10:12:15 ET JOB#: [id number]  cc: Annice NeedyJason S. Addie Alonge, MD, <Dictator> Annice NeedyJASON S Dayveon Halley MD ELECTRONICALLY SIGNED 05/07/2013 13:52

## 2015-04-03 NOTE — Consult Note (Signed)
PATIENT NAME:  Brenda Anderson, Brenda Anderson MR#:  [id number] DATE OF BIRTH:  June 19, 1971  DATE OF CONSULTATION:  04/01/2014  REFERRING PHYSICIAN:  Governor Rooksebecca Lord, MD CONSULTING PHYSICIAN:  Ollen GrossPaul S. Willeen CassBennett, MD  REASON FOR CONSULTATION: Tracheostomy tube is stuck.   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 Last evening at the care facility she resides at, they attempted to remove the tracheostomy tube to replace it, but could not get it out. Some bleeding was encountered with the attempt to remove the tube. Usually, the tracheostomy tube is replaced every 6 to 8 weeks, but they have not managed to replace it due to a miscommunication over the past few months. She is not currently having any bleeding or respiratory distress.   PAST MEDICAL HISTORY: Depression, ALS.   MEDICATIONS: Senna 50 mg tablets 2 p.o. daily, scopolamine 1.5 mg transdermal every 3 days, mirtazapine 50 mg p.o. daily, K-Dur 1 p.o. daily.   ALLERGIES: CIPRO.   PAST SURGICAL HISTORY:  Prior appendectomy, tonsillectomy, breast implants.   REVIEW OF SYSTEMS:  Unobtainable. She is not able to talk, but she currently does not have any fever.   PHYSICAL EXAMINATION: VITAL SIGNS: Temperature is 98.7, pulse 104, respirations 12. She is 100% saturated.  GENERAL: Thin female in no acute distress with a #7 cuffed Shiley tracheostomy tube in place. HEAD AND FACE: Head is normocephalic, atraumatic. No facial skin lesions.  NECK: Supple without adenopathy or mass. There is no thyromegaly. There are no salivary gland masses. She has a tracheostomy tube in place, which is a #7 cuffed Shiley tracheostomy tube, and she is on a ventilator. There is minimal granulation tissue at the lower lip of the tracheostomy wound that I can see under the tracheostomy flange.   The cuff was let down the tracheostomy tube. The patient's airway was then suctioned  to aspirate any secretions that might have passed beyond her balloon. The tracheostomy tube was then removed. It did require some force to remove, but was not terribly difficult to remove and there was minimal bleeding from the granulation tissue.  A #6 cuffed Shiley tracheostomy tube was then placed and the cuff expanded. The patient was noted to saturate well. She was resuctioned to remove any secretions. There was some minor oozing around the tracheostomy site, but no heavy bleeding.   ASSESSMENT:   Successful change of tracheostomy tube. We did have to downsize her to a #6 Shiley trach, as a #7 cuffed tracheostomy tube was not available in this facility. The next size up, an 8 Shiley tube was available, but given difficulty changing the tube, it was not felt appropriate to attempt to place a #8 tube. The patient did feel like the tube did not fit as tightly as the 7 tube, which is expected; however, there were no difficulties ventilating her. I did offer to the family the option of them obtaining a #7 Shiley tube and following up with me in the office to see if we might be able to place it, although given her size, a #6 Shiley tube is entirely appropriate. I discussed the patient with Dr. Shaune PollackLord and she is free to be discharged.   ____________________________ Ollen GrossPaul S. Willeen CassBennett, MD psb:dmm D: 04/03/2014 17:40:00 ET T: 04/03/2014 21:04:44 ET JOB#: [id number]  cc: Ollen GrossPaul S. Willeen CassBennett, MD, <Dictator> Sandi MealyPAUL S Donnald Tabar MD ELECTRONICALLY SIGNED 04/08/2014 7:39

## 2016-08-16 ENCOUNTER — Inpatient Hospital Stay
Admission: EM | Admit: 2016-08-16 | Discharge: 2016-08-18 | DRG: 208 | Disposition: A | Discharge status: Home Health | Payer: Medicare Other | Attending: Internal Medicine | Admitting: Internal Medicine

## 2016-08-16 ENCOUNTER — Emergency Department: Payer: Medicare Other

## 2016-08-16 DIAGNOSIS — Z79899 Other long term (current) drug therapy: Secondary | ICD-10-CM

## 2016-08-16 DIAGNOSIS — J9509 Other tracheostomy complication: Secondary | ICD-10-CM

## 2016-08-16 DIAGNOSIS — Z87891 Personal history of nicotine dependence: Secondary | ICD-10-CM | POA: Diagnosis not present

## 2016-08-16 DIAGNOSIS — Z9911 Dependence on respirator [ventilator] status: Secondary | ICD-10-CM | POA: Diagnosis not present

## 2016-08-16 DIAGNOSIS — F419 Anxiety disorder, unspecified: Secondary | ICD-10-CM | POA: Diagnosis present

## 2016-08-16 DIAGNOSIS — J9611 Chronic respiratory failure with hypoxia: Secondary | ICD-10-CM | POA: Diagnosis present

## 2016-08-16 DIAGNOSIS — J9503 Malfunction of tracheostomy stoma: Principal | ICD-10-CM | POA: Diagnosis present

## 2016-08-16 DIAGNOSIS — J9601 Acute respiratory failure with hypoxia: Secondary | ICD-10-CM | POA: Diagnosis not present

## 2016-08-16 DIAGNOSIS — G1221 Amyotrophic lateral sclerosis: Secondary | ICD-10-CM | POA: Diagnosis present

## 2016-08-16 DIAGNOSIS — J9610 Chronic respiratory failure, unspecified whether with hypoxia or hypercapnia: Secondary | ICD-10-CM

## 2016-08-16 DIAGNOSIS — J398 Other specified diseases of upper respiratory tract: Secondary | ICD-10-CM | POA: Diagnosis present

## 2016-08-16 DIAGNOSIS — Z931 Gastrostomy status: Secondary | ICD-10-CM | POA: Diagnosis not present

## 2016-08-16 DIAGNOSIS — F329 Major depressive disorder, single episode, unspecified: Secondary | ICD-10-CM | POA: Diagnosis present

## 2016-08-16 DIAGNOSIS — J9621 Acute and chronic respiratory failure with hypoxia: Secondary | ICD-10-CM | POA: Diagnosis not present

## 2016-08-16 DIAGNOSIS — R140 Abdominal distension (gaseous): Secondary | ICD-10-CM

## 2016-08-16 HISTORY — DX: Gastrostomy status: Z93.1

## 2016-08-16 HISTORY — DX: Amyotrophic lateral sclerosis: G12.21

## 2016-08-16 HISTORY — DX: Tracheostomy status: Z93.0

## 2016-08-16 LAB — BLOOD GAS, ARTERIAL
Acid-Base Excess: 2.3 mmol/L — ABNORMAL HIGH (ref 0.0–2.0)
Bicarbonate: 25 mmol/L (ref 20.0–28.0)
FIO2: 0.28
MECHVT: 450 mL
Mechanical Rate: 12
O2 Saturation: 99 %
PCO2 ART: 32 mmHg (ref 32.0–48.0)
PEEP: 5 cmH2O
Patient temperature: 37
pH, Arterial: 7.5 — ABNORMAL HIGH (ref 7.350–7.450)
pO2, Arterial: 119 mmHg — ABNORMAL HIGH (ref 83.0–108.0)

## 2016-08-16 LAB — GLUCOSE, CAPILLARY: GLUCOSE-CAPILLARY: 88 mg/dL (ref 65–99)

## 2016-08-16 MED ORDER — VITAMIN C 500 MG PO TABS
250.0000 mg | ORAL_TABLET | Freq: Two times a day (BID) | ORAL | Status: DC
  Administered 2016-08-17 – 2016-08-18 (×3): 250 mg
  Filled 2016-08-16: qty 1
  Filled 2016-08-16 (×2): qty 2

## 2016-08-16 MED ORDER — ACETAMINOPHEN 500 MG PO TABS
ORAL_TABLET | ORAL | Status: AC
  Filled 2016-08-16: qty 1

## 2016-08-16 MED ORDER — ACETAMINOPHEN 500 MG PO TABS
1000.0000 mg | ORAL_TABLET | Freq: Once | ORAL | Status: AC
  Administered 2016-08-16: 1000 mg via ORAL
  Filled 2016-08-16: qty 2

## 2016-08-16 MED ORDER — ATENOLOL 25 MG PO TABS
25.0000 mg | ORAL_TABLET | Freq: Three times a day (TID) | ORAL | Status: DC
  Filled 2016-08-16: qty 1

## 2016-08-16 MED ORDER — SODIUM CHLORIDE 0.9 % IV SOLN: 250.0000 mL | INTRAVENOUS | Status: DC | PRN

## 2016-08-16 MED ORDER — ACETAMINOPHEN 160 MG/5ML PO SOLN
1000.0000 mg | Freq: Once | ORAL | Status: DC
  Filled 2016-08-16: qty 40.6

## 2016-08-16 MED ORDER — CHLORHEXIDINE GLUCONATE 0.12 % MT SOLN
15.0000 mL | Freq: Two times a day (BID) | OROMUCOSAL | Status: DC
  Administered 2016-08-17 – 2016-08-18 (×3): 15 mL via OROMUCOSAL
  Filled 2016-08-16 (×4): qty 15

## 2016-08-16 MED ORDER — ORAL CARE MOUTH RINSE
15.0000 mL | Freq: Four times a day (QID) | OROMUCOSAL | Status: DC
  Administered 2016-08-17 (×2): 15 mL via OROMUCOSAL

## 2016-08-16 MED ORDER — ACETAMINOPHEN 500 MG PO TABS
ORAL_TABLET | ORAL | Status: AC
  Filled 2016-08-16: qty 2

## 2016-08-16 MED ORDER — CLONAZEPAM 0.5 MG PO TABS
0.2500 mg | ORAL_TABLET | Freq: Two times a day (BID) | ORAL | Status: DC
  Administered 2016-08-17 – 2016-08-18 (×3): 0.25 mg
  Filled 2016-08-16 (×4): qty 1

## 2016-08-16 MED ORDER — HEPARIN SODIUM (PORCINE) 5000 UNIT/ML IJ SOLN
5000.0000 [IU] | Freq: Three times a day (TID) | INTRAMUSCULAR | Status: DC
  Administered 2016-08-17 – 2016-08-18 (×5): 5000 [IU] via SUBCUTANEOUS
  Filled 2016-08-16 (×5): qty 1

## 2016-08-16 MED ORDER — PREGABALIN 50 MG PO CAPS
50.0000 mg | ORAL_CAPSULE | Freq: Every day | ORAL | Status: DC
  Administered 2016-08-17 – 2016-08-18 (×2): 50 mg
  Filled 2016-08-16 (×2): qty 1

## 2016-08-16 MED ORDER — FERROUS FUMARATE 324 (106 FE) MG PO TABS
1.0000 | ORAL_TABLET | Freq: Every day | ORAL | Status: DC
  Administered 2016-08-18: 106 mg via ORAL
  Filled 2016-08-16: qty 1

## 2016-08-16 MED ORDER — FAMOTIDINE 40 MG/5ML PO SUSR
20.0000 mg | Freq: Two times a day (BID) | ORAL | Status: DC
  Administered 2016-08-17 – 2016-08-18 (×3): 20 mg
  Filled 2016-08-16 (×6): qty 2.5

## 2016-08-16 MED ORDER — CITALOPRAM HYDROBROMIDE 20 MG PO TABS
20.0000 mg | ORAL_TABLET | Freq: Every day | ORAL | Status: DC
  Administered 2016-08-17: 20 mg
  Filled 2016-08-16 (×2): qty 1

## 2016-08-16 NOTE — H&P (Signed)
PULMONARY / CRITICAL CARE MEDICINE   Name: Brenda Anderson MRN: [id number] DOB: Apr 30, 1971    ADMISSION DATE:  08/16/2016 CONSULTATION DATE:  08/16/16  REFERRING MD: Dr. Roxan Hockey  CHIEF COMPLAINT:  VDRF, cuff leak   HISTORY OF PRESENT ILLNESS:   Brenda Anderson is 45 year old female with PMH of ALS, Chronically Trached and pegged on home ventilator.  Patient developed cuff leak on 08/16/16.  Home care nurse reported being unable to obtain a seal with the ntrach, trach was changed but cuff leak persisted. Patient was brought by EMS to [facility]. Upon admission trach cuff was deflated,reseated,resecuredand inflated. Improved seal but patient continues to have leak. Mother seemed to be very anxious and wanted her daughter to be evaluated as inpatient and therefore PCCM team was asked to admit the patient.  PAST MEDICAL HISTORY :  She  has no past medical history on file.  PAST SURGICAL HISTORY: She  has no past surgical history on file.  Allergies  Allergen Reactions  . Other     purfume  . Ultram [Tramadol Hcl]     No current facility-administered medications on file prior to encounter.    No current outpatient prescriptions on file prior to encounter.    FAMILY HISTORY:  Her has no family status information on file.    SOCIAL HISTORY: She    REVIEW OF SYSTEMS:   Review of Systems  Constitutional: Negative for fever.  HENT: Negative for ear discharge and nosebleeds.   Eyes: Negative for photophobia and pain.  Respiratory: Negative for hemoptysis, sputum production, shortness of breath and wheezing.   Cardiovascular: Negative for palpitations, orthopnea, claudication, leg swelling and PND.  Gastrointestinal: Negative for abdominal pain, nausea and vomiting.  Genitourinary: Negative for flank pain, frequency, hematuria and urgency.  Neurological: Negative for tingling, tremors, sensory change and speech change.  Psychiatric/Behavioral: Negative for hallucinations, substance abuse  and suicidal ideas.      SUBJECTIVE:  Patient has persistent cuffleak despite changing the trach twice.  VITAL SIGNS: BP (!) 85/64   Pulse 80   Temp 98 F (36.7 C) (Oral)   Resp 18   Wt 134 lb (60.8 kg)   SpO2 98%   HEMODYNAMICS:    VENTILATOR SETTINGS: Vent Mode: AC FiO2 (%):  [35 %] 35 % Set Rate:  [12 bmp] 12 bmp Vt Set:  [500 mL] 500 mL PEEP:  [5 cmH20] 5 cmH20  INTAKE / OUTPUT: No intake/output data recorded.  PHYSICAL EXAMINATION: General:  White female, with chronic trach/peg Neuro:  Awake, alert, oriented, mouths words HEENT:  Atraumatic, normocephalic,trached,some blood around the trach Cardiovascular: S1S2, RRR , no MRG noted  Lungs:  Clear bilaterally, no wheezes,crackles, rhonchi noted Abdomen:  Peg tube in place, soft. Nontender, active bowel sounds Musculoskeletal:  Contractures upper and lower extremities. Skin:  Grossly intact  LABS:  BME  Imaging Dg Chest 1 View  Result Date: 08/16/2016 CLINICAL DATA:  Tracheostomy tube, acute respiratory distress question tube displacement EXAM: CHEST 1 VIEW COMPARISON:  Portable exam 1842 hours compared to 04/29/2013 FINDINGS: Tip of endotracheal tube projects 1.5 cm above carina. Normal heart size, mediastinal contours, and pulmonary vascularity. Chronic elevation of RIGHT diaphragm with RIGHT basilar atelectasis. Biapical scarring. Remaining lungs clear. No pleural effusion or pneumothorax. Bones demineralized. BILATERAL breast prostheses. IMPRESSION: RIGHT basilar atelectasis. Electronically Signed   By: Ulyses Southward M.D.   On: 08/16/2016 19:05     STUDIES:  None   CULTURES: none  ANTIBIOTICS: none  SIGNIFICANT EVENTS:  9/6 patient admitted to [facility] for evaluation of her cuffleak.  LINES/TUBES: none  DISCUSSION: 45 year old female with PMH of ALS. On Home ventilator. Developed cuff leak.  Janina Mayorach was changed twice but air leak persists. Patient was admitted for evaluation of  cuff-leak.  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  P:   Chronic trach and vent, no weaning Keep O2 sats >94% Trach changed x2 , but cuff leak persists,patient is adequately ventilating ABG in am ENT consult   CARDIOVASCULAR A:  No active issues P:  Continuous telemetry Continue home  atenolol  RENAL A:   No active issues P:   BMET intermittently Replace electrolytes per ICU protocol  GASTROINTESTINAL A:   No active issues P:   Will start tube feeding  pepcid for GIP  HEMATOLOGIC A:   No active issues P:   transfuse if HgB<7 Heparin for DVT prophylaxis  INFECTIOUS A:   No active issues P:   Monitor fever curve  ENDOCRINE A:   No active issues P:   Blood sugar checks with BMP  NEUROLOGIC A:   Hx of depression Hx of Anxiety P:   RASS goal: 0 Continue home celexa Continue lyrica    Bincy Varughese,AG-ACNP Pulmonary and Critical Care Medicine [facility]   08/16/2016, 9:45 PM  Pt seen and examined, agree with NP, ALS with chronic vent, now with trach leak, uncertain etiology. Janina Mayorach was changed in ER without improvement, ENT has been consulted for further evaluation. Pt otherwise awake, alert, in no acute distress.  Will monitor in ICU.   Wells Guiles-Deep Jeraldine Primeau, M.D.  08/17/2016   Critical Care Attestation.  I have personally obtained a history, examined the patient, evaluated laboratory and imaging results, formulated the assessment and plan and placed orders. The Patient requires high complexity decision making for assessment and support, frequent evaluation and titration of therapies, application of advanced monitoring technologies and extensive interpretation of multiple databases. The patient has critical illness that could lead imminently to failure of 1 or more organ systems and requires the highest level of physician preparedness to intervene.  Critical Care Time devoted to patient care  services described in this note is 35 minutes and is exclusive of time spent in procedures.

## 2016-08-16 NOTE — ED Notes (Signed)
Pt with existing trach that is leaking air to pt mouth and around trach. Cuff is inflated. Small amount bloody drainage around trach. Breath sounds course but hard to assess with leak. Pt indicates that she is getting adequate air. sats 99-100%. Pt suctioned for bloody drainage as needed.

## 2016-08-16 NOTE — ED Notes (Signed)
Pt's brief changed and pericare performed.  Pt had small dark smear bm.  States she takes iron daily.

## 2016-08-16 NOTE — ED Notes (Signed)
Report called to Pamela in CCU 

## 2016-08-16 NOTE — Therapy (Signed)
Patient admitted to E.D. After trach tube change at home. Patient is chronic vent/trach. Home care nurse reported being unable to obtain a seal with new trach. Suspecting a problem with the trach, the trach was changed again with the same problem. At that point, home health called EMS. On admission, trach cuff was deflated, reseated, re-secured and inflated. Improved seal but patient continues to have a leak. Vent shows adequate Vt return for now. Bloody drainage noted around stroma and suctioning bloody secretions. HR and SpO2 stable. Patient alert and responsive but communicates discomfort with trach and ventilation. Patient currently has a Environmental managerhiley specialty trach #7.0 100 mm.

## 2016-08-16 NOTE — ED Notes (Signed)
Dr. Roxan Hockeyobinson is at the bedside talking to the family about the plan of action. ED MD wants to change the Pt's tracheostomy tube.

## 2016-08-16 NOTE — ED Triage Notes (Signed)
Pt with existing trach brought by ems from home for ?vent or trach malfunction.

## 2016-08-16 NOTE — Progress Notes (Signed)
Assisted Dr. Roxan Hockeyobinson in a trach change. Attempting to rid pt of air leak. Dr. Ander SladeUsed pt's home trach. Trach inserted after hyper oxygenating pt on vent with 100% FIO2. Trach inserted with help of bougie. Small amount of bright red blood. Pt tolerated procedure well. Suctioned a small amount of bright red blood after trach change. Co2 detector and auscultation of chest. Chest rise noted as well. Pt returned to ventilator. Air leak still present.

## 2016-08-16 NOTE — ED Notes (Signed)
ET suctioned pt w/small amount of bloody secretions returned.  Pt tolerated well.  Updated on admission status and awaiting hospitalist to see to write admission orders.

## 2016-08-16 NOTE — ED Provider Notes (Addendum)
[facility] Emergency Department Provider Note    First MD Initiated Contact with Patient 08/16/16 1756     (approximate)  I have reviewed the triage vital signs and the nursing notes.   HISTORY  Chief Complaint Respiratory Distress    HPI Brenda Anderson is a 45 y.o. female with ALS on chronic trach presents with hypoxic episode status post trach exchange at home. Patient was otherwise doing well on her chronic vent settings. She is scheduled to have a trach exchanged monthly and today was the day 4 to be changed. Exchanged 170 XL T cuffed trach but there appeared to be a balloon failure therefore a second one was exchanged. On exchanging the second one the ventilator alarm was suggesting significant circuit leak   PMH : ALS  There are no active problems to display for this patient.   No recent surgeries  Prior to Admission medications   Not on File    Allergies Other and Ultram [tramadol hcl]  No family history of easy bleeding or bruising  Social History Social History  Substance Use Topics  . Smoking status: Not on file  . Smokeless tobacco: Not on file  . Alcohol use Not on file    Review of Systems Patient denies headaches, rhinorrhea, blurry vision, numbness, shortness of breath, chest pain, edema, cough, abdominal pain, nausea, vomiting, diarrhea, dysuria, fevers, rashes or hallucinations unless otherwise stated above in HPI. ____________________________________________   PHYSICAL EXAM:  VITAL SIGNS: Vitals:   08/16/16 1830 08/16/16 1900  BP: (!) 82/59 (!) 81/63  Pulse: 83 81  Resp: 20 19  Temp:      Constitutional: Alert and oriented. Chronically ill trach patient Eyes: Conjunctivae are normal. PERRL. EOMI. Head: Atraumatic. Nose: No congestion/rhinnorhea. Mouth/Throat: Mucous membranes are moist.  Oropharynx non-erythematous. Neck: 7O Shiley trach in place with moderate amount of blood at the tracheostomy site. Does  have active cuff leak. Cardiovascular: Normal rate, regular rhythm. Grossly normal heart sounds.  Good peripheral circulation. Respiratory: Normal respiratory effort.  No retractions. Lungs CTAB. Gastrointestinal: Soft and nontender. No distention. No abdominal bruits. No CVA tenderness. Genitourinary:  Musculoskeletal: No lower extremity edema.  No joint effusions. Skin:  Skin is warm, dry and intact. No rash noted.   ____________________________________________   LABS (all labs ordered are listed, but only abnormal results are displayed)  Results for orders placed or performed during the hospital encounter of 08/16/16 (from the past 24 hour(s))  Blood gas, arterial     Status: Abnormal   Collection Time: 08/16/16 10:03 PM  Result Value Ref Range   FIO2 0.28    Delivery systems VENTILATOR    Mode ASSIST CONTROL    VT 450 mL   Peep/cpap 5.0 cm H20   pH, Arterial 7.50 (H) 7.350 - 7.450   pCO2 arterial 32 32.0 - 48.0 mmHg   pO2, Arterial 119 (H) 83.0 - 108.0 mmHg   Bicarbonate 25.0 20.0 - 28.0 mmol/L   Acid-Base Excess 2.3 (H) 0.0 - 2.0 mmol/L   O2 Saturation 99.0 %   Patient temperature 37.0    Collection site REVIEWED BY    Sample type ARTERIAL DRAW    Allens test (pass/fail) PASS PASS   Mechanical Rate 12   Glucose, capillary     Status: None   Collection Time: 08/16/16 11:24 PM  Result Value Ref Range   Glucose-Capillary 88 65 - 99 mg/dL   ____________________________________________  ____________________________________________  RADIOLOGY  CXR my read shows no  evidence of acute cardiopulmonary process.  ____________________________________________   PROCEDURES  Procedure(s) performed: none    Critical Care performed: yes CRITICAL CARE Performed by: Willy Eddy   Total critical care time: 35 minutes  Critical care time was exclusive of separately billable procedures and treating other patients.  Critical care was necessary to treat or prevent  imminent or life-threatening deterioration.  Critical care was time spent personally by me on the following activities: development of treatment plan with patient and/or surrogate as well as nursing, discussions with consultants, evaluation of patient's response to treatment, examination of patient, obtaining history from patient or surrogate, ordering and performing treatments and interventions, ordering and review of laboratory studies, ordering and review of radiographic studies, pulse oximetry and re-evaluation of patient's condition.  ____________________________________________   INITIAL IMPRESSION / ASSESSMENT AND PLAN / ED COURSE  Pertinent labs & imaging results that were available during my care of the patient were reviewed by me and considered in my medical decision making (see chart for details).  DDX: Trach malfunction, tracheomalacia, tracheal stenosis, tracheal fistula, foreign body, pneumothorax, congestive heart failure   Brenda Anderson is a 45 y.o. who presents to the ED with acute respiratory failure status post tracheostomy exchange. Upon arrival to the ER she is having active cuff leak but is satting well on minimal FiO2 settings. She has equal breath sounds bilaterally therefore I do suspect that her tracheostomy is patent. No evidence of foreign body obstruction. Chest x-ray ordered due to concern for retained foreign body or tracheal hemorrhage. Chest x-ray unremarkable. She does not have any increasing pressure requirements on her ventilator settings to explain new cuff leak.  The patient will be placed on continuous pulse oximetry and telemetry for monitoring.        Comment By Time  Patient reassessed. Currently ventilating well with no hemodynamic instability or hypoxia. Willy Eddy, MD 09/06 1836  Will page ENT regarding tracheostomy malfunction. Willy Eddy, MD 09/06 1902  Chest x-ray does not show any evidence of significant consolidation or effusion. I  have suspicious for some sort of malpositioning of the trach or cuff.  We will try exchanging with new tracheostomy tube. Willy Eddy, MD 09/06 1926  Brenda Anderson was exchanged out without complication. Still with a what appears to be distal cuff leak. Patient with good tidal volumes. Reports the patient does not have appropriate staff at home and with her recent hypoxic episode with trach change do feel is appropriate for monitoring. Willy Eddy, MD 09/06 2005     ____________________________________________   FINAL CLINICAL IMPRESSION(S) / ED DIAGNOSES  Final diagnoses:  Other tracheostomy complication (HCC)  Chronic respiratory failure, unspecified whether with hypoxia or hypercapnia (HCC)      NEW MEDICATIONS STARTED DURING THIS VISIT:  New Prescriptions   No medications on file     Note:  This document was prepared using Dragon voice recognition software and may include unintentional dictation errors.    Willy Eddy, MD 08/16/16 7829    Willy Eddy, MD 08/16/16 [phone]

## 2016-08-17 ENCOUNTER — Encounter: Payer: Self-pay | Admitting: *Deleted

## 2016-08-17 ENCOUNTER — Inpatient Hospital Stay: Payer: Medicare Other

## 2016-08-17 LAB — BLOOD GAS, ARTERIAL
Acid-Base Excess: 0.3 mmol/L (ref 0.0–2.0)
Bicarbonate: 23.1 mmol/L (ref 20.0–28.0)
FIO2: 0.28
Mechanical Rate: 12
O2 SAT: 99.1 %
PATIENT TEMPERATURE: 37
PCO2 ART: 31 mmHg — AB (ref 32.0–48.0)
PEEP/CPAP: 5 cmH2O
PH ART: 7.48 — AB (ref 7.350–7.450)
PO2 ART: 127 mmHg — AB (ref 83.0–108.0)
VT: 450 mL

## 2016-08-17 LAB — COMPREHENSIVE METABOLIC PANEL
ALT: 26 U/L (ref 14–54)
ANION GAP: 4 — AB (ref 5–15)
AST: 21 U/L (ref 15–41)
Albumin: 3.4 g/dL — ABNORMAL LOW (ref 3.5–5.0)
Alkaline Phosphatase: 61 U/L (ref 38–126)
BUN: 21 mg/dL — ABNORMAL HIGH (ref 6–20)
CHLORIDE: 111 mmol/L (ref 101–111)
CO2: 23 mmol/L (ref 22–32)
Calcium: 8.5 mg/dL — ABNORMAL LOW (ref 8.9–10.3)
Creatinine, Ser: <0.3 mg/dL — ABNORMAL LOW (ref 0.44–1.00)
Glucose, Bld: 70 mg/dL (ref 65–99)
Potassium: 3.5 mmol/L (ref 3.5–5.1)
SODIUM: 138 mmol/L (ref 135–145)
Total Bilirubin: 0.4 mg/dL (ref 0.3–1.2)
Total Protein: 6.4 g/dL — ABNORMAL LOW (ref 6.5–8.1)

## 2016-08-17 LAB — MRSA PCR SCREENING: MRSA BY PCR: POSITIVE — AB

## 2016-08-17 LAB — PHOSPHORUS: PHOSPHORUS: 3.1 mg/dL (ref 2.5–4.6)

## 2016-08-17 LAB — MAGNESIUM: Magnesium: 2.2 mg/dL (ref 1.7–2.4)

## 2016-08-17 MED ORDER — ACETAMINOPHEN 325 MG PO TABS
650.0000 mg | ORAL_TABLET | Freq: Four times a day (QID) | ORAL | Status: DC | PRN
  Administered 2016-08-17: 650 mg via ORAL
  Filled 2016-08-17: qty 2

## 2016-08-17 MED ORDER — ONDANSETRON HCL 4 MG/2ML IJ SOLN
4.0000 mg | Freq: Four times a day (QID) | INTRAMUSCULAR | Status: DC | PRN
  Administered 2016-08-17 (×2): 4 mg via INTRAVENOUS
  Filled 2016-08-17 (×2): qty 2

## 2016-08-17 MED ORDER — MUPIROCIN 2 % EX OINT
1.0000 "application " | TOPICAL_OINTMENT | Freq: Two times a day (BID) | CUTANEOUS | Status: DC
  Administered 2016-08-17 – 2016-08-18 (×4): 1 via NASAL
  Filled 2016-08-17: qty 22

## 2016-08-17 MED ORDER — MORPHINE SULFATE (PF) 2 MG/ML IV SOLN
0.5000 mg | INTRAVENOUS | Status: DC | PRN
  Administered 2016-08-17: 1 mg via INTRAVENOUS
  Administered 2016-08-17: 2 mg via INTRAVENOUS
  Administered 2016-08-17: 1 mg via INTRAVENOUS
  Administered 2016-08-17: 0.5 mg via INTRAVENOUS
  Administered 2016-08-18 (×3): 2 mg via INTRAVENOUS
  Filled 2016-08-17 (×7): qty 1

## 2016-08-17 MED ORDER — CHLORHEXIDINE GLUCONATE CLOTH 2 % EX PADS
6.0000 | MEDICATED_PAD | Freq: Every day | CUTANEOUS | Status: DC
  Administered 2016-08-17 – 2016-08-18 (×2): 6 via TOPICAL

## 2016-08-17 MED ORDER — JEVITY 1.5 CAL/FIBER PO LIQD
1000.0000 mL | ORAL | Status: DC
  Administered 2016-08-17: 1000 mL

## 2016-08-17 MED ORDER — MORPHINE SULFATE (PF) 2 MG/ML IV SOLN: 2.0000 mg | INTRAVENOUS | Status: DC | PRN

## 2016-08-17 MED ORDER — PROMETHAZINE HCL 25 MG/ML IJ SOLN
12.5000 mg | Freq: Four times a day (QID) | INTRAMUSCULAR | Status: DC | PRN
  Administered 2016-08-17: 12.5 mg via INTRAVENOUS
  Filled 2016-08-17: qty 1

## 2016-08-17 MED ORDER — SODIUM CHLORIDE 0.9 % IV BOLUS (SEPSIS)
500.0000 mL | Freq: Once | INTRAVENOUS | Status: AC
  Administered 2016-08-17: 500 mL via INTRAVENOUS

## 2016-08-17 MED ORDER — FREE WATER
200.0000 mL | Freq: Three times a day (TID) | Status: DC
  Administered 2016-08-17 – 2016-08-18 (×2): 200 mL

## 2016-08-17 NOTE — Progress Notes (Signed)
Initial Nutrition Assessment  DOCUMENTATION CODES:   Not applicable  INTERVENTION:  -Received verbal order from MD Ramachandran to restart TF. Recommend Jevity 1.5 at rate of 75 ml/hr for 12 hours per day (infuse daily from 9:00 to 21:00-provides 1350 kcals 57 g protein and 684 mL of free water). Recommend additional free water flushes of 200 mL TID to meet hydration needs. 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 Pt with hx ALS; pt reports she lives at home with her mother.   Per pt report, pt takes 3 cans of Jevity 1.5 per day, infuses at 75 ml/hr via pump during the day until total volume administered (1067 kcals, 45 g protein, 540 mL free water). Pt indicates that she has been tolerating at home without N/V, pt has been having regular BMs and no diarrhea. Pt does not take anything by mouth. Additional free water flushes via PEG tube but unsure how much  Difficult to assess wt history on visit today; no weight encounters in computer.   Nutrition-Focused physical exam completed. Findings are no fat depletion, no to mild/moderate muscle depletion (difficult to assess), and mild edema. Pt with hx of ALS, bed bound  Diet Order:  Diet NPO time specified  Skin:  Reviewed, no issues  Last BM:  9/6   Labs: reviewed  Meds: reviewed  Height:   Ht Readings from Last 1 Encounters:  08/16/16 5\' 7"  (1.702 m)    Weight:   Wt Readings from Last 1 Encounters:  08/17/16 124 lb 9 oz (56.5 kg)    Filed Weights   08/16/16 1757 08/16/16 2350 08/17/16 0317  Weight: 134 lb (60.8 kg) 124 lb 9 oz (56.5 kg) 124 lb 9 oz (56.5 kg)     BMI:  Body mass index is 19.51 kg/m.  Estimated Nutritional Needs:   Kcal:  1390 kcals or 1140-1425 kcals   Protein:  57-80 g  Fluid:  >/= 1.4 L  EDUCATION NEEDS:   No education needs identified at this time  Romelle StarcherCate Shanisha Lech MS, RD, LDN [phone] Pager  [phone] Weekend/On-Call Pager

## 2016-08-17 NOTE — Care Management Note (Signed)
Case Management Note  Patient Details  Name: Brenda Anderson MRN: [id number] Date of Birth: November 27, 1971  Subjective/Objective:                   Met with patient and her brother to discuss discharge planning. Patient is from home with her mother. She anticipates ENT changing trach cuff due to recurrent leak. She is followed in the home by Private Duty Nursing through [place]- please resume home health care at discharge. She is non-ambulatory per brother and will require EMS to home. She is chronic trach and ventilation at home. She is also has a PEG for nutrition, etc.  Action/Plan:   Alvis Lemmings is aware that patient is admitted. Joelene Millin is the contact [phone]. She plans to visit with patient today.   Expected Discharge Date:                  Expected Discharge Plan:     In-House Referral:     Discharge planning Services  CM Consult  Post Acute Care Choice:  Home Health Choice offered to:  Patient  DME Arranged:    DME Agency:     HH Arranged:  RN [place] Agency:  [place]  Status of Service:  In process, will continue to follow  If discussed at Long Length of Stay Meetings, dates discussed:    Additional Comments:  Marshell Garfinkel, RN 08/17/2016, 11:14 AM

## 2016-08-17 NOTE — Progress Notes (Signed)
eLink Physician-Brief Progress Note Patient Name: Brenda Anderson DOB: Jan 01, 1971 MRN: [id number]   Date of Service  08/17/2016  HPI/Events of Note  N/V - Zofran not effective. Patient requests Phenergan.   eICU Interventions  Will order: 1. Phenergan 1.25 mg IV Q 6 hours PRN N/V.     Intervention Category Intermediate Interventions: Other:  Brenda Anderson,Brenda Anderson 08/17/2016, 4:56 PM

## 2016-08-17 NOTE — Progress Notes (Signed)
Pt is complaining of nausea. Orders from Dr.Kasa to give Zofran. 4mg . Will continue to assess.

## 2016-08-17 NOTE — Progress Notes (Signed)
Pt currently wanting to hold off on morning medications until she can have her tube feeding. Pt is feeling sick to her stomach. She says this will resolve when she eats.  Will continue to assess.

## 2016-08-17 NOTE — Consult Note (Signed)
Laury, Huizar [id number] [birth year] Shane Crutch, MD  Reason for Consult: Tracheostomy leak  HPI: 45 y.o. Female with long term ventilation dependence.  History of tracheostomy 2.5 years ago at [facility].  Transferred to [facility] at after multiple trials of tracheostomy tubes, placed on Shiley Custom total length low pressure size 7 single cannula tracheostomy tube.  Unclear of why the smaller and longer tube was needed and patient and family unable to explain if tracheal stenosis present or not.  Per patient's family, after tracheostomy tube change yesterday patient had an episode where she required "bagging" and was unresponsive.  Tracheostomy tube was changed a total of 3 times yesterday with continued leak after first change. She continues to have her custom tracheostomy tube in place.  She reports neck pain and difficulty sleeping.  Allergies:  Allergies  Allergen Reactions  . Other     purfume  . Ultram [Tramadol Hcl]     ROS: Review of systems normal other than 12 systems except per HPI.  PMH: No past medical history on file.  FH: No family history on file.  SH:  Social History   Social History  . Marital status: Married    Spouse name: N/A  . Number of children: N/A  . Years of education: N/A   Occupational History  . Not on file.   Social History Main Topics  . Smoking status: Former Smoker    Packs/day: 1.00    Years: 30.00    Types: Cigarettes    Quit date: 12/11/2012  . Smokeless tobacco: Never Used  . Alcohol use No  . Drug use: No  . Sexual activity: Not on file   Other Topics Concern  . Not on file   Social History Narrative  . No narrative on file    PSH: No past surgical history on file.  Physical  Exam:  GEN-  Supine in bed, responsive with nodding of head to questions.  Neuro-  Generalized upper and lower extremity weakness EARS- external ears clear.  OC/OP- Oral cavity, lips, gums, ororpharynx normal with no masses or lesions.   EXT- Skin warm and dry.  NOSE- Nasal cavity without polyps or purulence. External nose and ears without masses or lesions.  NECK-  Trach in place with dried bloody secretions.  Good air movement with ventilator with positional leak.  Cuff inflated  Tracheoscopy-  Tracheostomy tube patent.  Mild crusting at distal aspect and exam.  Visualized carina beneath distal tip of tracheostomy tube.  No obvious bleeding.  A/P: ALS with need for long term ventilatory support with positional leak.   Plan:  I had extensive discussion with family as well as Pulmonology regarding options.  Patient has had 3 tracheostomy tube changes in the last 12 hours with bloody secretions and positional leak.  During one of these changes, patient had unresponsive episode that required bagging of patient.  Patient's current tracheostomy has out diameter of 9.34mm.  Our closest [facility] 8XLT has an outer diameter of 13.16mm which is substantially larger.  Discussed custom shiley with [employer] and custom order form filled out for 7.5 low pressure cuff single cannula shiley.  This will take 6 days approximately to get and unsure if that will solve issue or not.  Will see how patient does overnight with leak.  If leak improves, patient does not need to be inpatient until new tracheostomy tube arrives and this can be changes as outpatient.  If significant leak continues, consider placement of ETT versus  possible transfer to [facility] (patient pref) for possible suspension laryngoscopy to evaluation trachea to determine if a single canula extended length tracheostomy tube is truly needed.   Viveka Wilmeth 08/17/2016 5:42 PM

## 2016-08-17 NOTE — Progress Notes (Signed)
Pt refusing mouth care, changing her brief, and SCD's. Pt has been educated on the importance of interventions and continues to refuse. Pt 's family was observed cutting of Pt tube feeding. When questioned as to motive mother stated "it was a patient request and that her stomach was hurting." Pt family was educated on importance on not touching equipment. Verbalized understanding.  Full assessment can be found in EPIC.  Pt  Is in stable condition at this time, pain controlled with morphine.  Report given to oncoming RN.

## 2016-08-17 NOTE — Progress Notes (Signed)
Pt and family state that [facility] Wish to continue tube feeds in hospital with HOB flat. Elevated HOB position made pt back hurt. NP states tube feeds can be given with HOB flat. NP [place] aware of Bp. Map goal of 60 or greater.

## 2016-08-18 ENCOUNTER — Inpatient Hospital Stay: Payer: Medicare Other

## 2016-08-18 ENCOUNTER — Encounter: Payer: Self-pay | Admitting: General Practice

## 2016-08-18 DIAGNOSIS — J9621 Acute and chronic respiratory failure with hypoxia: Secondary | ICD-10-CM

## 2016-08-18 LAB — BASIC METABOLIC PANEL
ANION GAP: 8 (ref 5–15)
BUN: 18 mg/dL (ref 6–20)
CHLORIDE: 112 mmol/L — AB (ref 101–111)
CO2: 20 mmol/L — ABNORMAL LOW (ref 22–32)
Calcium: 8.5 mg/dL — ABNORMAL LOW (ref 8.9–10.3)
Creatinine, Ser: <0.3 mg/dL — ABNORMAL LOW (ref 0.44–1.00)
Glucose, Bld: 72 mg/dL (ref 65–99)
POTASSIUM: 3.8 mmol/L (ref 3.5–5.1)
SODIUM: 140 mmol/L (ref 135–145)

## 2016-08-18 LAB — CBC
HCT: 37.2 % (ref 35.0–47.0)
HEMOGLOBIN: 12.5 g/dL (ref 12.0–16.0)
MCH: 33 pg (ref 26.0–34.0)
MCHC: 33.7 g/dL (ref 32.0–36.0)
MCV: 98.2 fL (ref 80.0–100.0)
PLATELETS: 222 10*3/uL (ref 150–440)
RBC: 3.79 MIL/uL — AB (ref 3.80–5.20)
RDW: 12.4 % (ref 11.5–14.5)
WBC: 11.8 10*3/uL — AB (ref 3.6–11.0)

## 2016-08-18 MED ORDER — PROMETHAZINE HCL 25 MG PO TABS
25.0000 mg | ORAL_TABLET | Freq: Three times a day (TID) | ORAL | Status: DC
  Administered 2016-08-18 (×2): 25 mg via ORAL
  Filled 2016-08-18: qty 2
  Filled 2016-08-18 (×2): qty 1
  Filled 2016-08-18: qty 2

## 2016-08-18 MED ORDER — SIMETHICONE 40 MG/0.6ML PO SUSP
80.0000 mg | Freq: Four times a day (QID) | ORAL | Status: DC | PRN
  Administered 2016-08-18: 80 mg via ORAL
  Filled 2016-08-18 (×2): qty 1.2

## 2016-08-18 MED ORDER — JEVITY 1.5 CAL/FIBER PO LIQD
1000.0000 mL | ORAL | Status: DC
  Administered 2016-08-18: 1000 mL

## 2016-08-18 MED ORDER — SENNOSIDES-DOCUSATE SODIUM 8.6-50 MG PO TABS
2.0000 | ORAL_TABLET | Freq: Two times a day (BID) | ORAL | Status: DC
  Administered 2016-08-18: 2 via ORAL
  Filled 2016-08-18: qty 2

## 2016-08-18 MED ORDER — HYDROMORPHONE HCL 1 MG/ML IJ SOLN
1.0000 mg | INTRAMUSCULAR | Status: DC | PRN
  Administered 2016-08-18 (×3): 1 mg via INTRAVENOUS
  Filled 2016-08-18 (×3): qty 1

## 2016-08-18 NOTE — Plan of Care (Signed)
EMS called awaiting pick up, no eta time given

## 2016-08-18 NOTE — H&P (Addendum)
PULMONARY / CRITICAL CARE MEDICINE   Name: Brenda Anderson MRN: [id number] DOB: 05/08/1971    ADMISSION DATE:  08/16/2016 CONSULTATION DATE:  08/16/16  REFERRING MD: Dr. Roxan Hockey  CHIEF COMPLAINT:  VDRF, cuff leak   HISTORY OF PRESENT ILLNESS:   Brenda Anderson is 45 year old female with PMH of ALS, Chronically Trached and pegged on home ventilator.  Patient developed cuff leak on 08/16/16.  Home care nurse reported being unable to obtain a seal with the ntrach, trach was changed but cuff leak persisted. Patient was brought by EMS to [facility]. Upon admission trach cuff was deflated,reseated,resecuredand inflated. Improved seal but patient continues to have leak. Mother seemed to be very anxious and wanted her daughter to be evaluated as inpatient and therefore PCCM team was asked to admit the patient.  REVIEW OF SYSTEMS:   Review of Systems  Constitutional: Negative for fever.  HENT: Negative for ear discharge and nosebleeds.   Eyes: Negative for photophobia and pain.  Respiratory: Negative for hemoptysis, sputum production, shortness of breath and wheezing.   Cardiovascular: Negative for palpitations, orthopnea, claudication, leg swelling and PND.  Gastrointestinal: Negative for abdominal pain, nausea and vomiting.  Genitourinary: Negative for flank pain, frequency, hematuria and urgency.  Neurological: Negative for tingling, tremors, sensory change and speech change.  Psychiatric/Behavioral: Negative for hallucinations, substance abuse and suicidal ideas.      SUBJECTIVE:  Patient has persistent cuffleak despite changing the trach twice.  VITAL SIGNS: BP (!) 82/57 (BP Location: Right Arm)   Pulse 75   Temp 97.8 F (36.6 C) (Axillary)   Resp 15   Ht 5\' 7"  (1.702 m)   Wt 126 lb 8.7 oz (57.4 kg)   SpO2 97%   BMI 19.82 kg/m   HEMODYNAMICS:    VENTILATOR SETTINGS: Vent Mode: PRVC FiO2 (%):  [28 %] 28 % Set Rate:  [12 bmp] 12 bmp Vt Set:  [450 mL] 450 mL PEEP:  [5 cmH20] 5  cmH20  INTAKE / OUTPUT: I/O last 3 completed shifts: In: 500 [I.V.:500] Out: -   PHYSICAL EXAMINATION: General:  White female, with chronic trach/peg Neuro:  Awake, alert, oriented, mouths words HEENT:  Atraumatic, normocephalic,trached,some blood around the trach Cardiovascular: S1S2, RRR , no MRG noted  Lungs:  Clear bilaterally, no wheezes,crackles, rhonchi noted Abdomen:  Peg tube in place, soft. Nontender, active bowel sounds Musculoskeletal:  Contractures upper and lower extremities. Skin:  Grossly intact  LABS:  BME  Imaging Dg Abd 1 View  Result Date: 08/18/2016 CLINICAL DATA:  Abdominal distension, history of ACL S. EXAM: ABDOMEN - 1 VIEW COMPARISON:  Supine abdominal radiograph of May 01, 2013 and CT scan of the abdomen and pelvis of the same day. FINDINGS: The colonic stool burden is increased. There is increased stool volume in the rectum suspicious for impaction. Proximal to this there is mildly distended gas-filled sigmoid colon. There is no small bowel obstructive pattern. There is curvature of the lumbar spine convex toward the left which is stable. No abnormal soft tissue calcifications are observed. IMPRESSION: Increase colonic stool burden consistent with constipation. Probable fecal impaction. Electronically Signed   By: David  [place] M.D.   On: 08/18/2016 07:24     STUDIES:  None   CULTURES: none  ANTIBIOTICS: none  SIGNIFICANT EVENTS: 9/6 patient admitted to [facility] ICU for evaluation of her cuffleak.  LINES/TUBES: none  DISCUSSION: 45 year old female with PMH of ALS. On Home ventilator. Developed cuff leak.  Janina Mayo was changed twice but  air leak persists. Patient was admitted for evaluation of cuff-leak.  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  Currently on vent, prvc/12/450/5/28%.  [facility]  Administer 5 drops of normal saline to trach with each suction event and prn.  CARDIOVASCULAR --  RENAL A:   No active issues P:   BMET intermittently Replace electrolytes per ICU protocol  GASTROINTESTINAL A:   No active issues Xray abd film reviewed, increased bowel gas pattern c/w constipation, likely consistent with ALS.  P:   Continue tube feeding  pepcid for GIP Add stool softener and pro-kinetic.   HEMATOLOGIC A:   No active issues P:   transfuse if HgB<7 Heparin for DVT prophylaxis  INFECTIOUS A:   No active issues P:   Monitor fever curve  ENDOCRINE A:   No active issues P:   Blood sugar checks with BMP  NEUROLOGIC A:   Hx of depression Hx of Anxiety P:   RASS goal: 0 Continue home celexa Continue lyrica   Wells Guiles-Deep Edison Nicholson, M.D.  08/18/2016

## 2016-08-18 NOTE — Care Management (Signed)
[facility] Case closed.

## 2016-08-18 NOTE — Progress Notes (Signed)
Patient refused head of bed elevation to greater than 30 degrees. Patient only allowing head of bed elevated to about 15 degrees. Patient and patient's brother report that is how bed elevated at home (RNs had been told this in report as well). RNs explained benefits of elevating head of bed and consequences including aspiration and pneumonia, especially while tube feedings running. Patient and family acknowledged and still refused and reported didn't want head of bed elevated even when tube feedings running as that is how patient gets them at home. MD acknowledged refusal of head of bed elevation and ordered tube feedings to proceed as ordered.

## 2016-08-18 NOTE — Plan of Care (Signed)
Problem: Tissue Perfusion: Goal: Risk factors for ineffective tissue perfusion will decrease Outcome: Progressing BP 97/61   Pulse 66   Temp 98.7 F (37.1 C) (Axillary)   Resp (!) 29   Ht 5\' 7"  (1.702 m)   Wt 57.4 kg (126 lb 8.7 oz)   SpO2 99%   BMI 19.82 kg/m  POC reviewed with patient, cont on q2 turns but pt refuses , vital signs closely monitored and any concerns addressed at this time. Will continue to monitor. No output so far, will check pt and scan bladder Restarted on tube feeding @75ml  with q8hr free h20 200ml. Refuses oral care. Wants to go home, [facility] confirmation [facility]

## 2016-08-18 NOTE — Discharge Instructions (Signed)
Resume all previous home medications.  Follow up with Dr. Andee PolesVaught for tracheostomy change.

## 2016-08-18 NOTE — Plan of Care (Addendum)
Notified MD of the BP changes and he stated to cont to monitor pt.  Pt asleep at this time.  MD is ok with the BP being so for discharge, pt being discharged as is.  BP (!) 81/47   Pulse 72   Temp 98.7 F (37.1 C) (Axillary)   Resp 20   Ht 5\' 7"  (1.702 m)   Wt 57.4 kg (126 lb 8.7 oz)   SpO2 98%   BMI 19.82 kg/m

## 2016-08-18 NOTE — Care Management (Signed)
Trilogy/vent has arrived to this room for transport. Per Cala [facility]berly [facility]  Nurse has arrived at patient's home and ready for patient. [facility] of care [facility] and added saline drops for trach suction.

## 2016-08-18 NOTE — Discharge Summary (Signed)
Physician Discharge Summary  Patient ID: Brenda Anderson MRN: [id number] DOB/AGE: [birth year] 45 y.o.  Admit date: 08/16/2016 Discharge date: 08/18/2016  Admission Diagnoses: Tracheomalacia.  ALS Acute on chronic respiratory failure.  Discharge Diagnoses:  Active Problems:   ALS (amyotrophic lateral sclerosis) (HCC) Tracheomalacia  Discharged Condition: fair  Hospital Course:  Patient was admitted with tracheal air leak. She was seen in the ER, she had  tracheostomy change 3 without improvement. Subsequent she was seen by ENT, it was noted that the patient's opening was likely too large for the current tracheostomy. Therefore, it was thought that a custom tracheostomy device needed to be ordered, this was ordered. However, it will take at least 6 days for to be delivered. ENT felt the patient was otherwise stable for discharge, and this could be done on an outpatient basis. In discussions with the patient's mother was noted that she has some concerns about the patient's home care, though the patient herself was very eager to be discharged and get back to her usual routine. Social worker was consulted to help arrange home care for the patient to be started when the patient arrives home today.   The patient is to follow-up with Dr. Andee PolesVaught from ENT service for tracheostomy change.  Res[facility] private [facility] nursing [facility]  Administer 5 drops of normal saline to trach with each suction event and prn.  Consults: ENT  Significant Diagnostic Studies: bronchoscopy by ENT.  Treatments: IV hydration  Discharge Exam: Blood pressure (!) 81/47, pulse 72, temperature 98.7 F (37.1 C), temperature source Axillary, resp. rate 20, height 5\' 7"  (1.702 m), weight 126 lb 8.7 oz (57.4 kg), SpO2 98 %.  For physical exam findings, see my note from earlier today.  Disposition:   Patient is discharged to home with home health care.     Medication List    TAKE these  medications   ascorbic acid 250 MG tablet Commonly known as:  VITAMIN C Take 250 mg by mouth 2 (two) times daily.   atenolol 25 MG tablet Commonly known as:  TENORMIN Place 3 tablets into feeding tube daily.   buPROPion 100 MG tablet Commonly known as:  WELLBUTRIN Place 1 tablet into feeding tube 3 (three) times daily.   citalopram 20 MG tablet Commonly known as:  CELEXA Take 20 mg by mouth daily.   clonazePAM 0.5 MG tablet Commonly known as:  KLONOPIN Place 0.25 mg into feeding tube 2 (two) times daily as needed.   FERROCITE 324 (106 Fe) MG Tabs tablet Generic drug:  Ferrous Fumarate Take 1 tablet by mouth 2 (two) times daily.   loratadine 10 MG tablet Commonly known as:  CLARITIN Place 1 tablet into feeding tube daily.   Melatonin 1 MG/ML Liqd Place 8 mLs into feeding tube at bedtime.   polyethylene glycol packet Commonly known as:  MIRALAX / GLYCOLAX Take 17 g by mouth daily as needed.   pregabalin 50 MG capsule Commonly known as:  LYRICA Take 2 capsules by mouth daily.   ranitidine 15 MG/ML syrup Commonly known as:  ZANTAC Place 10 mLs into feeding tube daily.   Vitamin D3 1000 units Caps Place 1 capsule into feeding tube 2 (two) times daily.        Signed: Shane Crutchradeep Nathalee Smarr 08/18/2016, 3:06 PM

## 2016-08-18 NOTE — Care Management (Signed)
Patient for discharge to home today by EMS after: Trilogy vent is brought in from home for EMS to transport AND private duty nurse is at patient's home ready to receive EMS/patient. [facility] is going to call this RNCM when nurse has arrived at patient's home address. Bedside [facility]. Saturday 08/19/16 coverage is staffed also for 14 hours (usual coverage); mother aware and agrees with discharge to home today. I will fax discharge summary to [facility]Kimberly [phone] when available. Per mother patient will be contacted by ENT (Dr. Andee PolesVaught) when custom trach/cuff is available (6-10 days) and patient will be taken again by EMS to facility for trach/cuff change. Trach ordered: Shiley Custom 100mm size 7.

## 2016-08-18 NOTE — Care Management (Signed)
[facility] [phone]  updated that patient may get to go home today pending ENT evaluation. EMS packet started.

## 2016-08-18 NOTE — Progress Notes (Signed)
..   08/18/2016 8:17 AM  Sherryl BartersHensley, Keigan [id number]    Temp:  [97.9 F (36.6 C)-98.4 F (36.9 C)] 98.2 F (36.8 C) (09/08 0200) Pulse Rate:  [61-82] 66 (09/08 0500) Resp:  [17-38] 38 (09/08 0500) BP: (82-99)/(54-71) 89/61 (09/08 0500) SpO2:  [93 %-99 %] 98 % (09/08 0500) FiO2 (%):  [28 %] 28 % (09/08 0422) Weight:  [57.4 kg (126 lb 8.7 oz)] 57.4 kg (126 lb 8.7 oz) (09/08 0510),    No intake or output data in the 24 hours ending 08/18/16 0817  Results for orders placed or performed during the hospital encounter of 08/16/16 (from the past 24 hour(s))  CBC     Status: Abnormal   Collection Time: 08/18/16  5:29 AM  Result Value Ref Range   WBC 11.8 (H) 3.6 - 11.0 K/uL   RBC 3.79 (L) 3.80 - 5.20 MIL/uL   Hemoglobin 12.5 12.0 - 16.0 g/dL   HCT 40.937.2 81.135.0 - 91.447.0 %   MCV 98.2 80.0 - 100.0 fL   MCH 33.0 26.0 - 34.0 pg   MCHC 33.7 32.0 - 36.0 g/dL   RDW 78.212.4 95.611.5 - 21.314.5 %   Platelets 222 150 - 440 K/uL  Basic metabolic panel     Status: Abnormal   Collection Time: 08/18/16  5:29 AM  Result Value Ref Range   Sodium 140 135 - 145 mmol/L   Potassium 3.8 3.5 - 5.1 mmol/L   Chloride 112 (H) 101 - 111 mmol/L   CO2 20 (L) 22 - 32 mmol/L   Glucose, Bld 72 65 - 99 mg/dL   BUN 18 6 - 20 mg/dL   Creatinine, Ser <0.86<0.30 (L) 0.44 - 1.00 mg/dL   Calcium 8.5 (L) 8.9 - 10.3 mg/dL   GFR calc non Af Amer NOT CALCULATED >60 mL/min   GFR calc Af Amer NOT CALCULATED >60 mL/min   Anion gap 8 5 - 15    SUBJECTIVE:  No acute events.  Resting comfortably.  Back pain and stomach pain last night with tube feeds.    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  Custom trach ordered yesterday p.m. But anticipate at least 6 days until it arrives.  Options are to be  discharged home in current state (given that no leak is present) and have changed as outpatient.  Admit until trach change but given patient's return to baseline status may be at higher risks of nosocomial infection than benefit.  Or consider transfer to [facility] where they may have other options prior to waiting a week for trach including stent placement in trachea to assist with collapse.  Will rescope later today and try to back tracheostomy tube out more than yesterday for evaluate extent of tracheomalacia.  Mio Schellinger 08/18/2016, 8:17 AM

## 2016-08-19 ENCOUNTER — Inpatient Hospital Stay
Admission: EM | Admit: 2016-08-19 | Discharge: 2016-08-28 | DRG: 207 | Disposition: A | Discharge status: Home Health | Payer: Medicare Other | Attending: Internal Medicine | Admitting: Internal Medicine

## 2016-08-19 ENCOUNTER — Emergency Department: Payer: Medicare Other

## 2016-08-19 ENCOUNTER — Encounter: Payer: Self-pay | Admitting: Emergency Medicine

## 2016-08-19 DIAGNOSIS — J9382 Other air leak: Secondary | ICD-10-CM | POA: Diagnosis present

## 2016-08-19 DIAGNOSIS — E872 Acidosis: Secondary | ICD-10-CM

## 2016-08-19 DIAGNOSIS — R0689 Other abnormalities of breathing: Secondary | ICD-10-CM

## 2016-08-19 DIAGNOSIS — G9340 Encephalopathy, unspecified: Secondary | ICD-10-CM | POA: Diagnosis present

## 2016-08-19 DIAGNOSIS — I959 Hypotension, unspecified: Secondary | ICD-10-CM | POA: Diagnosis present

## 2016-08-19 DIAGNOSIS — Z79899 Other long term (current) drug therapy: Secondary | ICD-10-CM

## 2016-08-19 DIAGNOSIS — E8729 Other acidosis: Secondary | ICD-10-CM

## 2016-08-19 DIAGNOSIS — J398 Other specified diseases of upper respiratory tract: Secondary | ICD-10-CM | POA: Diagnosis present

## 2016-08-19 DIAGNOSIS — Z87891 Personal history of nicotine dependence: Secondary | ICD-10-CM

## 2016-08-19 DIAGNOSIS — Z930 Tracheostomy status: Secondary | ICD-10-CM

## 2016-08-19 DIAGNOSIS — R4182 Altered mental status, unspecified: Secondary | ICD-10-CM | POA: Diagnosis not present

## 2016-08-19 DIAGNOSIS — J9622 Acute and chronic respiratory failure with hypercapnia: Principal | ICD-10-CM | POA: Diagnosis present

## 2016-08-19 DIAGNOSIS — G1221 Amyotrophic lateral sclerosis: Secondary | ICD-10-CM | POA: Diagnosis present

## 2016-08-19 DIAGNOSIS — K567 Ileus, unspecified: Secondary | ICD-10-CM

## 2016-08-19 DIAGNOSIS — Z931 Gastrostomy status: Secondary | ICD-10-CM

## 2016-08-19 DIAGNOSIS — F419 Anxiety disorder, unspecified: Secondary | ICD-10-CM | POA: Diagnosis present

## 2016-08-19 DIAGNOSIS — J9509 Other tracheostomy complication: Secondary | ICD-10-CM

## 2016-08-19 DIAGNOSIS — F329 Major depressive disorder, single episode, unspecified: Secondary | ICD-10-CM | POA: Diagnosis present

## 2016-08-19 DIAGNOSIS — J9600 Acute respiratory failure, unspecified whether with hypoxia or hypercapnia: Secondary | ICD-10-CM

## 2016-08-19 DIAGNOSIS — D72829 Elevated white blood cell count, unspecified: Secondary | ICD-10-CM | POA: Diagnosis present

## 2016-08-19 DIAGNOSIS — Z9911 Dependence on respirator [ventilator] status: Secondary | ICD-10-CM

## 2016-08-19 LAB — CBC WITH DIFFERENTIAL/PLATELET
BASOS PCT: 0 %
Basophils Absolute: 0 10*3/uL (ref 0–0.1)
Eosinophils Absolute: 0 10*3/uL (ref 0–0.7)
Eosinophils Relative: 0 %
HEMATOCRIT: 37.1 % (ref 35.0–47.0)
HEMOGLOBIN: 12.4 g/dL (ref 12.0–16.0)
LYMPHS PCT: 6 %
Lymphs Abs: 1.1 10*3/uL (ref 1.0–3.6)
MCH: 33.7 pg (ref 26.0–34.0)
MCHC: 33.5 g/dL (ref 32.0–36.0)
MCV: 100.6 fL — AB (ref 80.0–100.0)
MONO ABS: 1.1 10*3/uL — AB (ref 0.2–0.9)
Monocytes Relative: 6 %
NEUTROS ABS: 16.9 10*3/uL — AB (ref 1.4–6.5)
Neutrophils Relative %: 88 %
Platelets: 217 10*3/uL (ref 150–440)
RBC: 3.68 MIL/uL — ABNORMAL LOW (ref 3.80–5.20)
RDW: 13 % (ref 11.5–14.5)
WBC: 19 10*3/uL — AB (ref 3.6–11.0)

## 2016-08-19 MED ORDER — SODIUM CHLORIDE 0.9 % IV BOLUS (SEPSIS)
1000.0000 mL | Freq: Once | INTRAVENOUS | Status: AC
  Administered 2016-08-20: 1000 mL via INTRAVENOUS

## 2016-08-19 NOTE — ED Triage Notes (Signed)
Pt. Here from home recently discharged from this facility.  Pt. Had trach placement.  Family reports hearing leakage around trach.  Family also reports change in LOC during day.  Pt. Is verbally unresponsive.

## 2016-08-20 ENCOUNTER — Inpatient Hospital Stay: Payer: Medicare Other

## 2016-08-20 ENCOUNTER — Encounter: Payer: Self-pay | Admitting: Adult Health

## 2016-08-20 DIAGNOSIS — Z87891 Personal history of nicotine dependence: Secondary | ICD-10-CM | POA: Diagnosis not present

## 2016-08-20 DIAGNOSIS — Z931 Gastrostomy status: Secondary | ICD-10-CM | POA: Diagnosis not present

## 2016-08-20 DIAGNOSIS — J9382 Other air leak: Secondary | ICD-10-CM | POA: Diagnosis present

## 2016-08-20 DIAGNOSIS — R4182 Altered mental status, unspecified: Secondary | ICD-10-CM | POA: Diagnosis present

## 2016-08-20 DIAGNOSIS — G9340 Encephalopathy, unspecified: Secondary | ICD-10-CM | POA: Diagnosis present

## 2016-08-20 DIAGNOSIS — G1221 Amyotrophic lateral sclerosis: Secondary | ICD-10-CM | POA: Diagnosis not present

## 2016-08-20 DIAGNOSIS — Z9911 Dependence on respirator [ventilator] status: Secondary | ICD-10-CM | POA: Diagnosis not present

## 2016-08-20 DIAGNOSIS — Z79899 Other long term (current) drug therapy: Secondary | ICD-10-CM | POA: Diagnosis not present

## 2016-08-20 DIAGNOSIS — F329 Major depressive disorder, single episode, unspecified: Secondary | ICD-10-CM | POA: Diagnosis present

## 2016-08-20 DIAGNOSIS — J9622 Acute and chronic respiratory failure with hypercapnia: Principal | ICD-10-CM

## 2016-08-20 DIAGNOSIS — Z930 Tracheostomy status: Secondary | ICD-10-CM | POA: Diagnosis not present

## 2016-08-20 DIAGNOSIS — I959 Hypotension, unspecified: Secondary | ICD-10-CM | POA: Diagnosis present

## 2016-08-20 DIAGNOSIS — F419 Anxiety disorder, unspecified: Secondary | ICD-10-CM | POA: Diagnosis present

## 2016-08-20 DIAGNOSIS — D72829 Elevated white blood cell count, unspecified: Secondary | ICD-10-CM | POA: Diagnosis present

## 2016-08-20 DIAGNOSIS — J398 Other specified diseases of upper respiratory tract: Secondary | ICD-10-CM | POA: Diagnosis not present

## 2016-08-20 LAB — EXPECTORATED SPUTUM ASSESSMENT W REFEX TO RESP CULTURE

## 2016-08-20 LAB — BLOOD GAS, ARTERIAL
Acid-base deficit: 0.8 mmol/L (ref 0.0–2.0)
Bicarbonate: 25.8 mmol/L (ref 20.0–28.0)
FIO2: 0.3
Mechanical Rate: 18
O2 SAT: 97.5 %
PATIENT TEMPERATURE: 37
PCO2 ART: 50 mmHg — AB (ref 32.0–48.0)
PH ART: 7.32 — AB (ref 7.350–7.450)
PO2 ART: 104 mmHg (ref 83.0–108.0)
VT: 450 mL

## 2016-08-20 LAB — GLUCOSE, CAPILLARY
GLUCOSE-CAPILLARY: 92 mg/dL (ref 65–99)
Glucose-Capillary: 166 mg/dL — ABNORMAL HIGH (ref 65–99)
Glucose-Capillary: 79 mg/dL (ref 65–99)
Glucose-Capillary: 156 mg/dL — ABNORMAL HIGH (ref 65–99)

## 2016-08-20 LAB — BASIC METABOLIC PANEL
ANION GAP: 3 — AB (ref 5–15)
ANION GAP: 4 — AB (ref 5–15)
BUN: 18 mg/dL (ref 6–20)
BUN: 20 mg/dL (ref 6–20)
CALCIUM: 8.5 mg/dL — AB (ref 8.9–10.3)
CALCIUM: 8.7 mg/dL — AB (ref 8.9–10.3)
CO2: 29 mmol/L (ref 22–32)
CO2: 29 mmol/L (ref 22–32)
Chloride: 106 mmol/L (ref 101–111)
Chloride: 108 mmol/L (ref 101–111)
Creatinine, Ser: <0.3 mg/dL — ABNORMAL LOW (ref 0.44–1.00)
Creatinine, Ser: <0.3 mg/dL — ABNORMAL LOW (ref 0.44–1.00)
GLUCOSE: 216 mg/dL — AB (ref 65–99)
Glucose, Bld: 101 mg/dL — ABNORMAL HIGH (ref 65–99)
POTASSIUM: 4.6 mmol/L (ref 3.5–5.1)
Potassium: 4.7 mmol/L (ref 3.5–5.1)
Sodium: 138 mmol/L (ref 135–145)
Sodium: 141 mmol/L (ref 135–145)

## 2016-08-20 LAB — CBC
HEMATOCRIT: 34 % — AB (ref 35.0–47.0)
Hemoglobin: 11.8 g/dL — ABNORMAL LOW (ref 12.0–16.0)
MCH: 34.2 pg — ABNORMAL HIGH (ref 26.0–34.0)
MCHC: 34.7 g/dL (ref 32.0–36.0)
MCV: 98.6 fL (ref 80.0–100.0)
PLATELETS: 192 10*3/uL (ref 150–440)
RBC: 3.44 MIL/uL — ABNORMAL LOW (ref 3.80–5.20)
RDW: 13 % (ref 11.5–14.5)
WBC: 14 10*3/uL — AB (ref 3.6–11.0)

## 2016-08-20 LAB — LACTIC ACID, PLASMA
LACTIC ACID, VENOUS: 1.7 mmol/L (ref 0.5–1.9)
Lactic Acid, Venous: 1.1 mmol/L (ref 0.5–1.9)

## 2016-08-20 LAB — TROPONIN I

## 2016-08-20 LAB — MAGNESIUM: Magnesium: 2.2 mg/dL (ref 1.7–2.4)

## 2016-08-20 LAB — PHOSPHORUS: PHOSPHORUS: 3.6 mg/dL (ref 2.5–4.6)

## 2016-08-20 MED ORDER — VANCOMYCIN HCL IN DEXTROSE 750-5 MG/150ML-% IV SOLN
750.0000 mg | Freq: Two times a day (BID) | INTRAVENOUS | Status: DC
  Administered 2016-08-20: 750 mg via INTRAVENOUS
  Filled 2016-08-20 (×4): qty 150

## 2016-08-20 MED ORDER — SENNOSIDES 8.8 MG/5ML PO SYRP
10.0000 mL | ORAL_SOLUTION | Freq: Two times a day (BID) | ORAL | Status: DC
  Administered 2016-08-20 – 2016-08-27 (×16): 10 mL via ORAL
  Filled 2016-08-20 (×17): qty 10

## 2016-08-20 MED ORDER — VITAMIN D 1000 UNITS PO TABS
1000.0000 [IU] | ORAL_TABLET | Freq: Two times a day (BID) | ORAL | Status: DC
  Administered 2016-08-20 – 2016-08-28 (×17): 1000 [IU] via ORAL
  Filled 2016-08-20 (×17): qty 1

## 2016-08-20 MED ORDER — PIPERACILLIN-TAZOBACTAM 3.375 G IVPB
3.3750 g | Freq: Three times a day (TID) | INTRAVENOUS | Status: DC
  Administered 2016-08-20 – 2016-08-21 (×3): 3.375 g via INTRAVENOUS
  Filled 2016-08-20 (×3): qty 50

## 2016-08-20 MED ORDER — FAMOTIDINE 20 MG PO TABS
20.0000 mg | ORAL_TABLET | Freq: Two times a day (BID) | ORAL | Status: DC
  Administered 2016-08-20 (×3): 20 mg
  Filled 2016-08-20 (×3): qty 1

## 2016-08-20 MED ORDER — LORATADINE 10 MG PO TABS
10.0000 mg | ORAL_TABLET | Freq: Every day | ORAL | Status: DC
  Administered 2016-08-20 – 2016-08-28 (×9): 10 mg
  Filled 2016-08-20 (×9): qty 1

## 2016-08-20 MED ORDER — BUPROPION HCL 100 MG PO TABS
100.0000 mg | ORAL_TABLET | Freq: Three times a day (TID) | ORAL | Status: DC
  Administered 2016-08-20 – 2016-08-28 (×25): 100 mg
  Filled 2016-08-20 (×29): qty 1

## 2016-08-20 MED ORDER — SODIUM CHLORIDE 0.9 % IV BOLUS (SEPSIS)
1000.0000 mL | Freq: Once | INTRAVENOUS | Status: AC
  Administered 2016-08-20: 1000 mL via INTRAVENOUS

## 2016-08-20 MED ORDER — JEVITY 1.5 CAL/FIBER PO LIQD
1000.0000 mL | ORAL | Status: DC
  Administered 2016-08-20 – 2016-08-28 (×10): 1000 mL

## 2016-08-20 MED ORDER — FENTANYL CITRATE (PF) 100 MCG/2ML IJ SOLN
100.0000 ug | INTRAMUSCULAR | Status: DC | PRN
  Administered 2016-08-21 – 2016-08-24 (×10): 100 ug via INTRAVENOUS
  Filled 2016-08-20 (×8): qty 2

## 2016-08-20 MED ORDER — RANITIDINE HCL 15 MG/ML PO SYRP: 150.0000 mg | ORAL_SOLUTION | Freq: Every day | ORAL | Status: DC

## 2016-08-20 MED ORDER — CLONAZEPAM 0.5 MG PO TABS
0.2500 mg | ORAL_TABLET | Freq: Two times a day (BID) | ORAL | Status: DC | PRN
  Administered 2016-08-20: 0.25 mg
  Filled 2016-08-20: qty 1

## 2016-08-20 MED ORDER — ATENOLOL 25 MG PO TABS
75.0000 mg | ORAL_TABLET | Freq: Every day | ORAL | Status: DC
Start: 2016-08-20 — End: 2016-08-28
  Administered 2016-08-23: 75 mg
  Filled 2016-08-20 (×3): qty 3

## 2016-08-20 MED ORDER — ACETAMINOPHEN 325 MG PO TABS
650.0000 mg | ORAL_TABLET | Freq: Four times a day (QID) | ORAL | Status: DC | PRN
  Administered 2016-08-20 (×3): 650 mg via ORAL
  Filled 2016-08-20 (×3): qty 2

## 2016-08-20 MED ORDER — ONDANSETRON HCL 4 MG/2ML IJ SOLN
4.0000 mg | Freq: Four times a day (QID) | INTRAMUSCULAR | Status: DC | PRN
  Administered 2016-08-21 – 2016-08-27 (×5): 4 mg via INTRAVENOUS
  Filled 2016-08-20 (×5): qty 2

## 2016-08-20 MED ORDER — VITAMIN C 500 MG PO TABS
250.0000 mg | ORAL_TABLET | Freq: Two times a day (BID) | ORAL | Status: DC
  Administered 2016-08-20 – 2016-08-28 (×18): 250 mg via ORAL
  Filled 2016-08-20: qty 2
  Filled 2016-08-20 (×2): qty 1
  Filled 2016-08-20: qty 2
  Filled 2016-08-20 (×7): qty 1
  Filled 2016-08-20: qty 2
  Filled 2016-08-20 (×2): qty 1
  Filled 2016-08-20: qty 2
  Filled 2016-08-20 (×4): qty 1

## 2016-08-20 MED ORDER — FENTANYL CITRATE (PF) 100 MCG/2ML IJ SOLN
100.0000 ug | INTRAMUSCULAR | Status: DC | PRN
  Filled 2016-08-20 (×3): qty 2

## 2016-08-20 MED ORDER — MIDAZOLAM HCL 2 MG/2ML IJ SOLN
2.0000 mg | INTRAMUSCULAR | Status: DC | PRN
  Filled 2016-08-20 (×2): qty 2

## 2016-08-20 MED ORDER — SODIUM CHLORIDE 0.9 % IV BOLUS (SEPSIS)
500.0000 mL | Freq: Once | INTRAVENOUS | Status: AC
  Administered 2016-08-20: 500 mL via INTRAVENOUS

## 2016-08-20 MED ORDER — HEPARIN SODIUM (PORCINE) 5000 UNIT/ML IJ SOLN
5000.0000 [IU] | Freq: Three times a day (TID) | INTRAMUSCULAR | Status: DC
  Administered 2016-08-20 – 2016-08-24 (×13): 5000 [IU] via SUBCUTANEOUS
  Filled 2016-08-20 (×13): qty 1

## 2016-08-20 MED ORDER — SODIUM CHLORIDE 0.9 % IV SOLN
250.0000 mL | INTRAVENOUS | Status: DC | PRN
  Administered 2016-08-21: 250 mL via INTRAVENOUS

## 2016-08-20 MED ORDER — MIDAZOLAM HCL 2 MG/2ML IJ SOLN
2.0000 mg | INTRAMUSCULAR | Status: DC | PRN
  Administered 2016-08-22 – 2016-08-23 (×3): 2 mg via INTRAVENOUS
  Filled 2016-08-20: qty 2

## 2016-08-20 MED ORDER — POLYETHYLENE GLYCOL 3350 17 G PO PACK
17.0000 g | PACK | Freq: Every day | ORAL | Status: DC
  Administered 2016-08-20 – 2016-08-27 (×8): 17 g via ORAL
  Filled 2016-08-20 (×8): qty 1

## 2016-08-20 MED ORDER — IPRATROPIUM-ALBUTEROL 0.5-2.5 (3) MG/3ML IN SOLN
3.0000 mL | Freq: Four times a day (QID) | RESPIRATORY_TRACT | Status: DC | PRN
  Administered 2016-08-22: 3 mL via RESPIRATORY_TRACT
  Filled 2016-08-20: qty 3

## 2016-08-20 MED ORDER — CHLORHEXIDINE GLUCONATE 0.12 % MT SOLN
15.0000 mL | Freq: Two times a day (BID) | OROMUCOSAL | Status: DC
  Administered 2016-08-20 – 2016-08-28 (×13): 15 mL via OROMUCOSAL
  Filled 2016-08-20 (×3): qty 15

## 2016-08-20 MED ORDER — ORAL CARE MOUTH RINSE
15.0000 mL | OROMUCOSAL | Status: DC
  Administered 2016-08-20 – 2016-08-28 (×62): 15 mL via OROMUCOSAL

## 2016-08-20 MED ORDER — BISACODYL 10 MG RE SUPP
10.0000 mg | Freq: Every day | RECTAL | Status: DC | PRN
  Administered 2016-08-22: 10 mg via RECTAL
  Filled 2016-08-20: qty 1

## 2016-08-20 MED ORDER — VANCOMYCIN HCL IN DEXTROSE 750-5 MG/150ML-% IV SOLN
750.0000 mg | INTRAVENOUS | Status: AC
  Administered 2016-08-20: 750 mg via INTRAVENOUS
  Filled 2016-08-20: qty 150

## 2016-08-20 MED ORDER — POLYETHYLENE GLYCOL 3350 17 G PO PACK: 17.0000 g | PACK | Freq: Every day | ORAL | Status: DC | PRN

## 2016-08-20 MED ORDER — PREGABALIN 50 MG PO CAPS
100.0000 mg | ORAL_CAPSULE | Freq: Every day | ORAL | Status: DC
  Administered 2016-08-20 – 2016-08-28 (×9): 100 mg via ORAL
  Filled 2016-08-20 (×9): qty 2

## 2016-08-20 MED ORDER — JEVITY 1.2 CAL PO LIQD: 1000.0000 mL | ORAL | Status: DC

## 2016-08-20 MED ORDER — FERROUS FUMARATE 324 (106 FE) MG PO TABS
1.0000 | ORAL_TABLET | Freq: Two times a day (BID) | ORAL | Status: DC
  Administered 2016-08-20 – 2016-08-28 (×18): 106 mg via ORAL
  Filled 2016-08-20 (×18): qty 1

## 2016-08-20 MED ORDER — CITALOPRAM HYDROBROMIDE 20 MG PO TABS
20.0000 mg | ORAL_TABLET | Freq: Every day | ORAL | Status: DC
  Administered 2016-08-20 – 2016-08-28 (×9): 20 mg via ORAL
  Filled 2016-08-20 (×9): qty 1

## 2016-08-20 NOTE — H&P (Addendum)
PULMONARY / CRITICAL CARE MEDICINE   Name: DASHAWNA DELBRIDGE MRN: [id number] DOB: 16-Jan-1971    ADDENDUM: Discussed case with Dr. Andee Poles (ENT). 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  In the meantime, we will ask family to bring in home vent so that we can try her on it and see if that may be a cause of the problem, as she seemed to improve after she was switched to hospital vent. In addition we should readdress goals of care as she may be at the end of what we can do for her.   If the patient/family desires continued aggressive care, they may opt to be transferred to [facility] where she gets some of her care. She can be seen by ENT to see if there are other solutions or interventional pulmonology for potential stenting which would treat the tracheomalacia and hopefully allow the trach to stay in place without further leaking.  -Wells Guiles, M.D. 08/20/2016    ADMISSION DATE:  08/19/2016   REFERRING MD:  Dr. Scotty Court  CHIEF COMPLAINT:  Difficulty breathing  HISTORY OF PRESENT ILLNESS:   45 y/o female with ALS, chronic vent. Trach and peg who presents with worsening respiratory status and altered level of consciousness. History is obtained from EMS/ED records. Patient's nurse and mother stated that her home vent had low pressure alarms.Patient subsequently became less responsive and in severe respiratory distress. Hence she was brought back to the ED. At the ED, a venous blood gas showed a pH of 7.12, PCO2 95, PO2 33, HCO3 30.9. She was placed on the hospital vent and no cuff leak or low pressure alarms were noted. Repeat ABG shows  PH 7.32, PCO2 50, PO2 104, HCO3 25.8, O2 saturation 97.5% Her mental status has improved to her baseline. She nods in denial of pain She was recently discharged from the hospital after being  admitted for a cuff leak and low volumes on home vent. Her trach was changed x3 and ENT was consulted and it was deemed that she needed a custom order shiley. This was ordered through [employer]. Patient was discharged home on 08/18/16 to follow up with ENT as out-patient. Given that she is tolerating the hospital vent with minimal low pressure alarms, it will be imperative to have a trial period of her home vent after the custom trach is placed prior to discharge so if there is a problem with the vent as well, it will be detected and addressed prior to discharge.    PAST MEDICAL HISTORY :  She  has a past medical history of ALS (amyotrophic lateral sclerosis) (HCC); PEG (percutaneous endoscopic gastrostomy) status (HCC); and Tracheostomy dependence (HCC).  PAST SURGICAL HISTORY: She  has no past surgical history on file.  Allergies  Allergen Reactions  . Other     purfume  . Ultram [Tramadol Hcl]     No current facility-administered medications on file prior to encounter.    Current Outpatient Prescriptions on File Prior to Encounter  Medication Sig  . ascorbic acid (VITAMIN C) 250 MG tablet Take 250 mg by mouth 2 (two) times daily.  Marland Kitchen atenolol (TENORMIN) 25 MG tablet Place 3 tablets into feeding tube daily.  Marland Kitchen buPROPion (WELLBUTRIN) 100 MG tablet Place 1 tablet into feeding tube 3 (three) times daily.  . Cholecalciferol (VITAMIN D3) 1000  units CAPS Place 1 capsule into feeding tube 2 (two) times daily.   . citalopram (CELEXA) 20 MG tablet Take 20 mg by mouth daily.  . clonazePAM (KLONOPIN) 0.5 MG tablet Place 0.25 mg into feeding tube 2 (two) times daily as needed.   . Ferrous Fumarate (FERROCITE) 324 (106 Fe) MG TABS tablet Take 1 tablet by mouth 2 (two) times daily.  Marland Kitchen. loratadine (CLARITIN) 10 MG tablet Place 1 tablet into feeding tube daily.  . Melatonin 1 MG/ML LIQD Place 8 mLs into feeding tube at bedtime.  . polyethylene glycol (MIRALAX / GLYCOLAX) packet Take 17 g by mouth daily as  needed.  . pregabalin (LYRICA) 50 MG capsule Take 2 capsules by mouth daily.  . ranitidine (ZANTAC) 15 MG/ML syrup Place 10 mLs into feeding tube daily.    FAMILY HISTORY:  Her has no family status information on file.    SOCIAL HISTORY: She  reports that she quit smoking about 3 years ago. Her smoking use included Cigarettes. She has a 30.00 pack-year smoking history. She has never used smokeless tobacco. She reports that she does not drink alcohol or use drugs.  REVIEW OF SYSTEMS:   Unable to obtain due to patient's condition  SUBJECTIVE:   VITAL SIGNS: BP 103/79 (BP Location: Left Arm)   Pulse 96   Temp 97.6 F (36.4 C) (Axillary)   Resp 15   Ht 5\' 7"  (1.702 m)   Wt 120 lb (54.4 kg)   SpO2 98%   BMI 18.79 kg/m   HEMODYNAMICS:    VENTILATOR SETTINGS: Vent Mode: AC FiO2 (%):  [30 %] 30 % Set Rate:  [18 bmp] 18 bmp Vt Set:  [450 mL] 450 mL PEEP:  [5 cmH20] 5 cmH20  INTAKE / OUTPUT: No intake/output data recorded.  PHYSICAL EXAMINATION: General:  Chronically ill-looking, NAD Neuro: Awake, paraplegic, nods to commands HEENT: [place]/AT, tracheostomy/trach, oral mucosa moist Cardiovascular: RRR, S1/S2, no MRG Lungs: CTAB, diminished breath sounds in the bases Abdomen: Soft, non-tender, non-distended, normal bowel sounds Musculoskeletal: Contractures-lower and upper extremities Extremities:+2 pulses Skin: Warm and dry  LABS:  BMET  Recent Labs Lab 08/17/16 0319 08/18/16 0529 08/19/16 2340  NA 138 140 138  K 3.5 3.8 4.6  CL 111 112* 106  CO2 23 20* 29  BUN 21* 18 20  CREATININE <0.30* <0.30* <0.30*  GLUCOSE 70 72 216*    Electrolytes  Recent Labs Lab 08/17/16 0319 08/18/16 0529 08/19/16 2340  CALCIUM 8.5* 8.5* 8.7*  MG 2.2  --   --   PHOS 3.1  --   --     CBC  Recent Labs Lab 08/18/16 0529 08/19/16 2340  WBC 11.8* 19.0*  HGB 12.5 12.4  HCT 37.2 37.1  PLT 222 217    Coag's No results for input(s): APTT, INR in the last 168  hours.  Sepsis Markers  Recent Labs Lab 08/20/16 0004  LATICACIDVEN 1.1    ABG  Recent Labs Lab 08/16/16 2203 08/17/16 0503  PHART 7.50* 7.48*  PCO2ART 32 31*  PO2ART 119* 127*    Liver Enzymes  Recent Labs Lab 08/17/16 0319  AST 21  ALT 26  ALKPHOS 61  BILITOT 0.4  ALBUMIN 3.4*    Cardiac Enzymes  Recent Labs Lab 08/19/16 2340  TROPONINI <0.03    Glucose  Recent Labs Lab 08/16/16 2324  GLUCAP 88    Imaging Dg Chest Portable 1 View  Result Date: 08/20/2016 CLINICAL DATA:  45 year old female with dyspnea and altered mental  status. EXAM: PORTABLE CHEST 1 VIEW COMPARISON:  Chest radiograph dated 09/16/2016 FINDINGS: Tracheostomy above the carina in stable positioning. There is mild eventration of the right hemidiaphragm. The lungs are clear. There is no pleural effusion or pneumothorax. The cardiac silhouette is within normal limits. There is diffuse osteopenia with significant involvement of the left humeral head and glenoid, advanced for the patient's age. Correlation with underlying metabolic or hematopoietic etiology recommended. No acute fracture identified. IMPRESSION: No acute cardiopulmonary process. Electronically Signed   By: Elgie Collard M.D.   On: 08/20/2016 00:46     STUDIES:  None  CULTURES: 08/20/2016 Blood x 2 Urine  Sputum  ANTIBIOTICS: None  SIGNIFICANT EVENTS: 09/08>Disharged home 09/9>ED with low pressure alarms on home vent, hypercarbic encephalopathy  LINES/TUBES: Trach PIVs  DISCUSSION: 45 year old female with PMH of ALS, chronic respiratory failure on vent and peg, presenting with acute hypercarbic encephalopathy/CO2 narcosis due cuff leak. Admitted for airway management until custom trach is available   ASSESSMENT / PLAN:  PULMONARY A: VDRF ALS Persistent cuff leak -Tolerating hospital vent but still has intermittent low pressure alrms P:   Continue full vent support with current settings-PRVC /fiO2  30/peep 5 /RR 18/TV 450; no weaning Keep O2 sats >94% F/u ABG Await custom trach ENT re-consulted Trach care per protocol    GASTROINTESTINAL A:   S/P gastrotomy tube 2/2 ALS.  High risk for constipation P:   Continue tube feeds per protocol  Pepcid for gi prphylaxis Miralax 17 grams daily and dulcolax suppo prn  HEMATOLOGIC A:   No active issues P:   transfuse if HgB<7 Heparin for DVT prophylaxis  INFECTIOUS A:   Leukocytosis- no fever; CXR without acute changes P:   Monitor fever curve Sputum, blood and urine cultures  ENDOCRINE A:   No active issues P:   Blood sugar checks with BMP  NEUROLOGIC A: CO2 narcosis-improved with vent support  Hx of depression Hx of Anxiety Hx ALS P:   RASS goal: 0 Continue home dose of celexa, clonazepam, welbutrin and lyrica Supportive care  Disposition and Family update: No family at bedside. Will update when available Total CCM time=45 MINUTES  Magdalene S. [facility] ANP-BC Pulmonary and Critical Care Medicine [facility] Pager [phone] or [phone] 08/20/2016, 1:29 AM   Pt seen and examined, agree with NP. Pt presented with acute hypoxic and hypercapnic respiratory failure due to suspected cuff leak. She was seen her by ENT and a new trach was ordered, the patient was discharged home as the leak appeared minimal. She returned now due to hypoventilation on her home vent. While here her repeat blood gas shows vast improvement in her respiratory status as well as her blood gas findings. She is now much more awake, alert, and comfortable with normal air entry auscultated bilaterally.  P: --Continue on hospital vent.  --Await arrival of custom trach for tracheomalacia.  --Will ask family to bring in home vent to test it here to see if/how any leak develops.   Wells Guiles, M.D. 08/20/2016   Critical Care Attestation.  I have personally obtained a history, examined the patient, evaluated laboratory  and imaging results, formulated the assessment and plan and placed orders. The Patient requires high complexity decision making for assessment and support, frequent evaluation and titration of therapies, application of advanced monitoring technologies and extensive interpretation of multiple databases. The patient has critical illness that could lead imminently to failure of 1 or more organ systems and requires the highest level of  physician preparedness to intervene.  Critical Care Time devoted to patient care services described in this note is 35 minutes and is exclusive of time spent in procedures.

## 2016-08-20 NOTE — ED Provider Notes (Signed)
[facility] Emergency Department Provider Note  ____________________________________________  Time seen: Approximately 12:38 AM  I have reviewed the triage vital signs and the nursing notes.   HISTORY  Chief Complaint Respiratory Distress (Pt. here from home via EMS for change in respirtory status due to trach.) Level 5 caveat:  Portions of the history and physical were unable to be obtained due to the patient's acute illness and altered mental status    HPI Brenda Anderson is a 45 y.o. female who chronically has a trach and PEG. Brought to the ED due to altered mental status. She was recently in the hospital for breathing difficulties and was determined that she needed to have a larger trach cannula. This had to be ordered and will not be available for several days. No further history is obtainable at this time.     Past Medical History:  Diagnosis Date  . ALS (amyotrophic lateral sclerosis) (HCC)   . PEG (percutaneous endoscopic gastrostomy) status (HCC)   . Tracheostomy dependence [facility](HCC)      Patient Active Problem List   Diagnosis Date Noted  . ALS (amyotrophic lateral sclerosis) (HCC) 08/16/2016     History reviewed. No pertinent surgical history.   Prior to Admission medications   Medication Sig Start Date End Date Taking? Authorizing Provider  ascorbic acid (VITAMIN C) 250 MG tablet Take 250 mg by mouth 2 (two) times daily.    Historical Provider, MD  atenolol (TENORMIN) 25 MG tablet Place 3 tablets into feeding tube daily. 02/24/16   Historical Provider, MD  buPROPion (WELLBUTRIN) 100 MG tablet Place 1 tablet into feeding tube 3 (three) times daily. 02/24/16   Historical Provider, MD  Cholecalciferol (VITAMIN D3) 1000 units CAPS Place 1 capsule into feeding tube 2 (two) times daily.  02/24/16   Historical Provider, MD  citalopram (CELEXA) 20 MG tablet Take 20 mg by mouth daily.    Historical Provider, MD  clonazePAM (KLONOPIN) 0.5 MG tablet  Place 0.25 mg into feeding tube 2 (two) times daily as needed.  02/24/16   Historical Provider, MD  Ferrous Fumarate (FERROCITE) 324 (106 Fe) MG TABS tablet Take 1 tablet by mouth 2 (two) times daily.    Historical Provider, MD  loratadine (CLARITIN) 10 MG tablet Place 1 tablet into feeding tube daily. 02/24/16   Historical Provider, MD  Melatonin 1 MG/ML LIQD Place 8 mLs into feeding tube at bedtime. 02/24/16   Historical Provider, MD  polyethylene glycol (MIRALAX / GLYCOLAX) packet Take 17 g by mouth daily as needed.    Historical Provider, MD  pregabalin (LYRICA) 50 MG capsule Take 2 capsules by mouth daily. 02/24/16   Historical Provider, MD  ranitidine (ZANTAC) 15 MG/ML syrup Place 10 mLs into feeding tube daily. 02/24/16   Historical Provider, MD     Allergies Other and Ultram [tramadol hcl]   No family history on file.  Social History Social History  Substance Use Topics  . Smoking status: Former Smoker    Packs/day: 1.00    Years: 30.00    Types: Cigarettes    Quit date: 12/11/2012  . Smokeless tobacco: Never Used  . Alcohol use No    Review of Systems Unable to obtain  ____________________________________________   PHYSICAL EXAM:  VITAL SIGNS: ED Triage Vitals  Enc Vitals Group     BP 08/19/16 2336 103/79     Pulse Rate 08/19/16 2336 96     Resp 08/19/16 2336 15     Temp 08/19/16 2336  97.6 F (36.4 C)     Temp Source 08/19/16 2336 Axillary     SpO2 08/19/16 2336 98 %     Weight 08/19/16 2341 120 lb (54.4 kg)     Height 08/19/16 2341 5\' 7"  (1.702 m)     Head Circumference --      Peak Flow --      Pain Score --      Pain Loc --      Pain Edu? --      Excl. in GC? --     Vital signs reviewed, nursing assessments reviewed.   Constitutional:   Unresponsive.  Eyes:   No scleral icterus. No conjunctival pallor. PERRL.  ENT   Head:   Normocephalic and atraumatic.   Nose:   Dried secretions   Mouth/Throat:   Dry mucous membranes.    Neck:   No  stridor. No SubQ emphysema. No meningismus. Trach site with audible air leak. No soft tissue infection Hematological/Lymphatic/Immunilogical:   No cervical lymphadenopathy. Cardiovascular:   RRR. Symmetric bilateral radial and DP pulses.  No murmurs.  Respiratory:   Symmetric breath sounds diffusely. Gastrointestinal:   Soft and nontender. Non distended. PEG site clean. No rebound, rigidity, or guarding. Genitourinary:   deferred Musculoskeletal:   Nontender with normal range of motion in all extremities. No joint effusions.  No lower extremity tenderness.  No edema. Neurologic:   GCS =E4V1M4 = 7  Skin:    Skin is warm, dry and intact. No rash noted.  No petechiae, purpura, or bullae.  ____________________________________________    LABS (pertinent positives/negatives) (all labs ordered are listed, but only abnormal results are displayed) Labs Reviewed  BASIC METABOLIC PANEL - Abnormal; Notable for the following:       Result Value   Glucose, Bld 216 (*)    Creatinine, Ser <0.30 (*)    Calcium 8.7 (*)    Anion gap 3 (*)    All other components within normal limits  CBC WITH DIFFERENTIAL/PLATELET - Abnormal; Notable for the following:    WBC 19.0 (*)    RBC 3.68 (*)    MCV 100.6 (*)    Neutro Abs 16.9 (*)    Monocytes Absolute 1.1 (*)    All other components within normal limits  BLOOD GAS, VENOUS - Abnormal; Notable for the following:    pH, Ven 7.12 (*)    pCO2, Ven 95 (*)    Bicarbonate 30.9 (*)    All other components within normal limits  LACTIC ACID, PLASMA  TROPONIN I  LACTIC ACID, PLASMA   ____________________________________________   EKG    ____________________________________________    RADIOLOGY  Chest x-ray unremarkable  ____________________________________________   PROCEDURES Procedures CRITICAL CARE Performed by: Scotty Court, Dayden Viverette   Total critical care time: 35 minutes  Critical care time was exclusive of separately billable procedures  and treating other patients.  Critical care was necessary to treat or prevent imminent or life-threatening deterioration.  Critical care was time spent personally by me on the following activities: development of treatment plan with patient and/or surrogate as well as nursing, discussions with consultants, evaluation of patient's response to treatment, examination of patient, obtaining history from patient or surrogate, ordering and performing treatments and interventions, ordering and review of laboratory studies, ordering and review of radiographic studies, pulse oximetry and re-evaluation of patient's condition.  ____________________________________________   INITIAL IMPRESSION / ASSESSMENT AND PLAN / ED COURSE  Pertinent labs & imaging results that were available during my care of  the patient were reviewed by me and considered in my medical decision making (see chart for details).  Patient presents with altered mental status. Found to have severe respiratory acidosis with pH of 7.12 and PCO2 of 95. Consistent with CO2 narcosis due to inadequate ventilation, likely related to tracheostomy air leak. Patient changed to our Hospital ventilator with improvement in ventilation and some improvement in her mental status over time as well. Case discussed with critical care for admission and further management. The patient is stabilizing in the emergency department but unable to go home with her current home ventilator equipment.     Clinical Course   ____________________________________________   FINAL CLINICAL IMPRESSION(S) / ED DIAGNOSES  Final diagnoses:  Respiratory acidosis  CO2 narcosis  Other tracheostomy complication (HCC)       Portions of this note were generated with dragon dictation software. Dictation errors may occur despite best attempts at proofreading.    Sharman Cheek, MD 08/20/16 [phone]

## 2016-08-20 NOTE — Progress Notes (Signed)
Patient was brought into er due to respiratory distress.  Patient just discharged from this facility.  Now back for excessive trach leak and change in [facility] and mom. Home vent alarming low pressure. Patient's Vt noted to be 500/600's. BBS noted.  Not able to actually see vent settings since settings are locked per home care. VBG obtained while on home vent.  Placed patient on hospital vent per md order.  Immediate change noted in patient's condition.  Patient began to arouse and shake her head to my questions.  No vent alarming per hospital vent. Patient tolerating hospital vent well.  Setting adjustment made per Dr Scotty CourtStafford.

## 2016-08-20 NOTE — Progress Notes (Signed)
Pharmacy Antibiotic Note  Brenda Anderson is a 45 y.o. female admitted on 08/19/2016 with leukocytosis, GPC in sputum.  Pharmacy has been consulted for vancomycin and Zosyn dosing.  Plan: Vancomycin 750 IV every 12 hours with stacked dosing and a trough with the 5th dose.  Goal trough 15-20 mcg/mL. Zosyn 3.375g IV q8h (4 hour infusion).  Height: 5\' 7"  (170.2 cm) Weight: 120 lb (54.4 kg) IBW/kg (Calculated) : 61.6  Temp (24hrs), Avg:98.1 F (36.7 C), Min:97.6 F (36.4 C), Max:99.1 F (37.3 C)   Recent Labs Lab 08/17/16 0319 08/18/16 0529 08/19/16 2340 08/20/16 0004 08/20/16 0415  WBC  --  11.8* 19.0*  --  14.0*  CREATININE <0.30* <0.30* <0.30*  --  <0.30*  LATICACIDVEN  --   --   --  1.1 1.7    CrCl cannot be calculated (This lab value cannot be used to calculate CrCl because it is not a number: <0.30).    Allergies  Allergen Reactions  . Other     purfume  . Ultram [Tramadol Hcl]     Antimicrobials this admission: vancomycin 9/10 >>  Zosyn 9/10 >>   Dose adjustments this admission:   Microbiology results: 9/10 BCx: NGTD x  9/10 TA: pending  Thank you for allowing pharmacy to be a part of this patient's care.  Luisa HartChristy, Ani Deoliveira D 08/20/2016 3:35 PM

## 2016-08-20 NOTE — Progress Notes (Signed)
Patient vent alarming due to high peak pressures and low minute ventilation. She was  anxious with increased work of breathing. She was lavaged with normal saline, bagged with oxygen. I suctioned copious amounts tan, pink tinged secretions through trach with mucus plugs. Patient left in no acute distress on the ventilator.

## 2016-08-20 NOTE — Progress Notes (Signed)
eLink Physician-Brief Progress Note Patient Name: Brenda Anderson DOB: 09/28/1971 MRN: [id number]   Date of Service  08/20/2016  HPI/Events of Note  WBC = 14.0, Temp = 99.1 F and GPC in clusters and Gram Variable Rod in sputum.  eICU Interventions  Will order: 1. Vancomycin and Zosyn per Pharmacy Consult.     Intervention Category Major Interventions: Infection - evaluation and management  Sommer,Steven Eugene 08/20/2016, 3:27 PM

## 2016-08-20 NOTE — Progress Notes (Signed)
Initial Nutrition Assessment  DOCUMENTATION CODES:   Not applicable  INTERVENTION:  -Begin Jevity 1.5 @ 8775mL/hr from 0900-2100, provides 1350 calories, 57gm protein (88% of estimated needs), and 684cc free water -Recommend 200mL free water TID to meet hydration needs  NUTRITION DIAGNOSIS:   Inadequate oral intake related to inability to eat as evidenced by NPO status. -ongoing  GOAL:   Patient will meet greater than or equal to 90% of their needs -not meeting currently  MONITOR:   Labs, Skin, I & O's, Weight trends, Vent status, TF tolerance  REASON FOR ASSESSMENT:   Consult Enteral/tube feeding initiation and management  ASSESSMENT:   45 y/o female with ALS, chronic vent. Trach and peg who presents with worsening respiratory status and altered level of consciousness. History is obtained from EMS/ED records. Patient's nurse and mother stated that her home vent had low pressure alarms.Patient subsequently became less responsive and in severe respiratory distress  Patient is currently intubated on ventilator support MV: 6.8 L/min Temp (24hrs), Avg:97.7 F (36.5 C), Min:97.6 F (36.4 C), Max:97.8 F (36.6 C)  Per RN, pt's family requests pt stay on similar tf regimen she is on at home, where she does 3 cans of Jevity 1.5 per day, infusing at 2675mL/hr, providing 1067 calories, 45gm protein, and 540cc free water. This would meet 84% of calorie needs, 69% of protein needs, and 45% free water needs. RD consulted for TF, entered protocol and Jevity 1.5 @ 75 over 12 hours. She was recently discharged after being admitted for cuff leak and low volumes on home vent.  Unable to assess wt hx. Nutrition-Focused physical exam completed. Findings are no fat depletion, mild-moderate muscle depletion, and no edema.  No PO Intake at home Labs and Medications reviewed: CBGs 166 Vitamin D, Senokot, Vitamin C  Diet Order:  Diet NPO time specified  Skin:  Reviewed, no issues  Last BM:   08/16/2016  Height:   Ht Readings from Last 1 Encounters:  08/20/16 5\' 7"  (1.702 m)    Weight:   Wt Readings from Last 1 Encounters:  08/19/16 120 lb (54.4 kg)    Ideal Body Weight:  61.36 kg  BMI:  Body mass index is 18.79 kg/m.  Estimated Nutritional Needs:   Kcal:  1277 calories  Protein:  65-81 gm  Fluid:  >/= 1.2L  EDUCATION NEEDS:   No education needs identified at this time  Dionne AnoWilliam M. Chamia Schmutz, MS, RD LDN Inpatient Clinical Dietitian Pager [phone]

## 2016-08-21 ENCOUNTER — Inpatient Hospital Stay: Payer: Medicare Other

## 2016-08-21 LAB — CBC
HEMATOCRIT: 28.6 % — AB (ref 35.0–47.0)
Hemoglobin: 10 g/dL — ABNORMAL LOW (ref 12.0–16.0)
MCH: 33.3 pg (ref 26.0–34.0)
MCHC: 34.9 g/dL (ref 32.0–36.0)
MCV: 95.6 fL (ref 80.0–100.0)
Platelets: 181 10*3/uL (ref 150–440)
RBC: 2.99 MIL/uL — ABNORMAL LOW (ref 3.80–5.20)
RDW: 12.5 % (ref 11.5–14.5)
WBC: 12.1 10*3/uL — AB (ref 3.6–11.0)

## 2016-08-21 LAB — GLUCOSE, CAPILLARY
GLUCOSE-CAPILLARY: 80 mg/dL (ref 65–99)
GLUCOSE-CAPILLARY: 78 mg/dL (ref 65–99)
GLUCOSE-CAPILLARY: 95 mg/dL (ref 65–99)
Glucose-Capillary: 76 mg/dL (ref 65–99)
Glucose-Capillary: 63 mg/dL — ABNORMAL LOW (ref 65–99)
Glucose-Capillary: 169 mg/dL — ABNORMAL HIGH (ref 65–99)
Glucose-Capillary: 117 mg/dL — ABNORMAL HIGH (ref 65–99)

## 2016-08-21 LAB — BASIC METABOLIC PANEL
ANION GAP: 3 — AB (ref 5–15)
BUN: 13 mg/dL (ref 6–20)
CALCIUM: 8.1 mg/dL — AB (ref 8.9–10.3)
CO2: 25 mmol/L (ref 22–32)
Chloride: 112 mmol/L — ABNORMAL HIGH (ref 101–111)
Creatinine, Ser: <0.3 mg/dL — ABNORMAL LOW (ref 0.44–1.00)
GLUCOSE: 73 mg/dL (ref 65–99)
POTASSIUM: 3.7 mmol/L (ref 3.5–5.1)
Sodium: 140 mmol/L (ref 135–145)

## 2016-08-21 LAB — PROCALCITONIN

## 2016-08-21 MED ORDER — SODIUM CHLORIDE 0.9 % IV BOLUS (SEPSIS)
1000.0000 mL | Freq: Once | INTRAVENOUS | Status: AC
  Administered 2016-08-21: 1000 mL via INTRAVENOUS

## 2016-08-21 MED ORDER — DEXTROSE 5 % IV SOLN
0.0000 ug/min | INTRAVENOUS | Status: DC
  Administered 2016-08-21: 20 ug/min via INTRAVENOUS
  Filled 2016-08-21: qty 1

## 2016-08-21 MED ORDER — PROMETHAZINE HCL 25 MG/ML IJ SOLN
25.0000 mg | INTRAMUSCULAR | Status: DC | PRN
  Administered 2016-08-21 – 2016-08-26 (×7): 25 mg via INTRAVENOUS
  Filled 2016-08-21 (×7): qty 1

## 2016-08-21 MED ORDER — FAMOTIDINE 20 MG PO TABS
20.0000 mg | ORAL_TABLET | Freq: Two times a day (BID) | ORAL | Status: DC
  Administered 2016-08-21 – 2016-08-28 (×15): 20 mg via ORAL
  Filled 2016-08-21 (×15): qty 1

## 2016-08-21 MED ORDER — METOCLOPRAMIDE HCL 5 MG/ML IJ SOLN
5.0000 mg | Freq: Four times a day (QID) | INTRAMUSCULAR | Status: DC
  Administered 2016-08-21 – 2016-08-28 (×30): 5 mg via INTRAVENOUS
  Filled 2016-08-21 (×29): qty 2

## 2016-08-21 MED ORDER — DEXTROSE 50 % IV SOLN
INTRAVENOUS | Status: AC
  Administered 2016-08-21: 25 mL
  Filled 2016-08-21: qty 50

## 2016-08-21 MED ORDER — SODIUM CHLORIDE 0.9 % IV SOLN
INTRAVENOUS | Status: DC
  Administered 2016-08-21 – 2016-08-27 (×9): via INTRAVENOUS

## 2016-08-21 MED ORDER — PROMETHAZINE HCL 25 MG/ML IJ SOLN
12.5000 mg | Freq: Four times a day (QID) | INTRAMUSCULAR | Status: DC | PRN
  Administered 2016-08-21: 12.5 mg via INTRAVENOUS
  Filled 2016-08-21: qty 1

## 2016-08-21 NOTE — Progress Notes (Addendum)
eLink Physician-Brief Progress Note Patient Name: Brenda Anderson DOB: 15-Jun-1971 MRN: [id number]   Date of Service  08/21/2016  HPI/Events of Note  BP = 78/48.   eICU Interventions  Will order: 1. Bolus with 0.9 NaCl 1 liter IV over 1 hour now.  2. Phenylephrine IV infusion. Titrate to MAP >= 65. 3. 0.9 NaCl to run IV at 75 mL/hour.      Intervention Category Major Interventions: Acid-Base disturbance - evaluation and management;Hypotension - evaluation and management  Lenell AntuSommer,Steven Eugene 08/21/2016, 8:45 PM

## 2016-08-21 NOTE — Progress Notes (Signed)
eLink Physician-Brief Progress Note Patient Name: Jennefer Bravolicia L Quintanilla DOB: [birth year] MRN: [id number]   Date of Service  08/21/2016  HPI/Events of Note  SBP = 70's.   eICU Interventions  Will bolus with 0.9 NaCl 1 liter IV over 1 hour now.      Intervention Category Major Interventions: Hypotension - evaluation and management  Lenell AntuSommer,Steven Eugene 08/21/2016, 6:44 PM

## 2016-08-21 NOTE — Consult Note (Signed)
..   Brenda Anderson, Brenda Anderson [id number] 1971-11-25 Shane CrutchPradeep Ramachandran, MD  Reason for Consult: evaluate leak of tracheostomy  HPI: 45 y.o. Vent dependant patient with history of ALS returns to ICU due to respiratory failure.  Elevated CO2 on admission.  Improved with being placed on ICU ventilator.  Previous leak was positional but now leak appears to be constant with normal cuff pressures.  Custom trach ordered last week, but have not heard any ETA.  Allergies:  Allergies  Allergen Reactions  . Other     purfume  . Ultram [Tramadol Hcl]     ROS: Review of systems normal other than 12 systems except per HPI.  PMH:  Past Medical History:  Diagnosis Date  . ALS (amyotrophic lateral sclerosis) (HCC)   . PEG (percutaneous endoscopic gastrostomy) status (HCC)   . Tracheostomy dependence (HCC)     FH: History reviewed. No pertinent family history.  SH:  Social History   Social History  . Marital status: Divorced    Spouse name: N/A  . Number of children: N/A  . Years of education: N/A   Occupational History  . Not on file.   Social History Main Topics  . Smoking status: Former Smoker    Packs/day: 1.00    Years: 30.00    Types: Cigarettes    Quit date: 12/11/2012  . Smokeless tobacco: Never Used  . Alcohol use No  . Drug use: No  . Sexual activity: Not on file   Other Topics Concern  . Not on file   Social History Narrative  . No narrative on file    PSH: History reviewed. No pertinent surgical history.  Physical  Exam:  GEN-  Responsive to questions and commands Ears- external hears WNL NOSE- clear anteriorly OC/OP-  No masses or lesions NECK-  Supple, Size 7 custom single canula Shiley in place with moderate leak with each breath RESP-  On vent with leak   A/P: ALS with long term vent dependence and tracheomalacia on last tracheoscopy  Plan:  Discussed with patient and respiratory therapy.  Most likely leak is causing respiratory failure and due to persistent  and progressive leak, will try and change to size 8 cuffed Shiley regular length today at noon.  Will need to have spare Size 7 Custom Shiley that family has as backup.  Respiratory aware.  Discussed with patient and she is in agreement.  This will most likely only be a temporary solution as tracheomalacia will most likely progress and leak with return in the future.   Tymar Polyak 08/21/2016 8:17 AM

## 2016-08-21 NOTE — Progress Notes (Signed)
Call placed to eLink. [place] w/ Dr. Arsenio LoaderSommer regarding pt's SBP being in the 70's over the last hour; has been in the mid to upper 90's majority of last 24 hours. Order 1 liter 0.9 NS bolus over one hour x 1 now. Will start the bolus now and continue to assess. BEC

## 2016-08-21 NOTE — Progress Notes (Signed)
Dr. Andee PolesVaught changed trach to a #8 Shiley. New trach was inserted without difficulty. The cuff pressures are 32. The expired tidal volumes are 340. There is no audible leak at this time.

## 2016-08-21 NOTE — Progress Notes (Signed)
CONCERNING: IV to Oral Route Change Policy  RECOMMENDATION: This patient is receiving famotidine by the intravenous route.  Based on criteria approved by the Pharmacy and Therapeutics Committee, the intravenous medication(s) is/are being converted to the equivalent oral dose form(s).   DESCRIPTION: These criteria include:  The patient is eating (either orally or via tube) and/or has been taking other orally administered medications for a least 24 hours  The patient has no evidence of active gastrointestinal bleeding or impaired GI absorption (gastrectomy, short bowel, patient on TNA or NPO).  If you have questions about this conversion, please contact the Pharmacy Department  []   [phone]( [phone] )  [facility] [x]   [phone]( [phone] )  [facility] []   [phone]( [phone] )  [facility] []   [phone]( [phone] )  [facility] []   [phone]( [phone] )  [facility]   Milia Warth L, [facility] MedRPH 08/21/2016 9:29 AM

## 2016-08-21 NOTE — Care Management (Addendum)
RNCM will continue to follow. [facility] Waiting on custom trach cuff with potential delivery date 9/14. Patient's home Trilogy (NIV) at bedside.  RT is going to see if her home NIV still leaks after patient's trach/cuff changed to size 8. EMS will be needed to get patient back home- EMS can manage her home NIV which [facility]. Make sure it is charged for battery transport. Mom does not want LTAC and mom does not want SNF. Plan is to take patient back to her residence.

## 2016-08-21 NOTE — Progress Notes (Signed)
PULMONARY / CRITICAL CARE MEDICINE   Name: Brenda Anderson MRN: [id number] DOB: January 24, 1971    ADMISSION DATE:  08/19/2016   REFERRING MD:  Dr. Scotty Court  CHIEF COMPLAINT:  Difficulty breathing  HISTORY OF PRESENT ILLNESS:   45 y/o female with ALS, chronic vent. Trach and peg who presents with worsening respiratory status and altered level of consciousness. History is obtained from EMS/ED records. Patient's nurse and mother stated that her home vent had low pressure alarms.Patient subsequently became less responsive and in severe respiratory distress. Hence she was brought back to the ED. At the ED, a venous blood gas showed a pH of 7.12, PCO2 95, PO2 33, HCO3 30.9. She was placed on the hospital vent and no cuff leak or low pressure alarms were noted. Repeat ABG shows  PH 7.32, PCO2 50, PO2 104, HCO3 25.8, O2 saturation 97.5% Her mental status has improved to her baseline. She nods in denial of pain She was recently discharged from the hospital after being admitted for a cuff leak and low volumes on home vent. Her trach was changed x3 and ENT was consulted and it was deemed that she needed a custom order shiley. This was ordered through [employer]. Patient was discharged home on 08/18/16 to follow up with ENT as out-patient. Given that she is tolerating the hospital vent with minimal low pressure alarms, it will be imperative to have a trial period of her home vent after the custom trach is placed prior to discharge so if there is a problem with the vent as well, it will be detected and addressed prior to discharge.    SUBJECTIVE: No acute issues overnight.No low pressure or high pressure alarms.   VITAL SIGNS: BP 98/61   Pulse 72   Temp 98.6 F (37 C) (Oral)   Resp 20   Ht 5\' 7"  (1.702 m)   Wt 136 lb 0.4 oz (61.7 kg)   SpO2 98%   BMI 21.30 kg/m   HEMODYNAMICS:    VENTILATOR SETTINGS: Vent Mode: PRVC FiO2 (%):  [25 %] 25 % Set Rate:  [20 bmp] 20 bmp Vt Set:  [450 mL] 450 mL PEEP:  [5  cmH20] 5 cmH20  INTAKE / OUTPUT: I/O last 3 completed shifts: In: 3120 [NG/GT:670; IV Piggyback:2450] Out: -   PHYSICAL EXAMINATION: General:  Chronically ill-looking, NAD Neuro: Awake, paraplegic, nods to commands HEENT: [place]/AT, tracheostomy/trach, oral mucosa moist Cardiovascular: RRR, S1/S2, no MRG Lungs: CTAB, diminished breath sounds in the bases Abdomen: Soft, non-tender, non-distended, normal bowel sounds Musculoskeletal: Contractures-lower and upper extremities Extremities:+2 pulses Skin: Warm and dry  LABS:  BMET  Recent Labs Lab 08/19/16 2340 08/20/16 0415 08/21/16 0445  NA 138 141 140  K 4.6 4.7 3.7  CL 106 108 112*  CO2 29 29 25   BUN 20 18 13   CREATININE <0.30* <0.30* <0.30*  GLUCOSE 216* 101* 73    Electrolytes  Recent Labs Lab 08/17/16 0319  08/19/16 2340 08/20/16 0415 08/21/16 0445  CALCIUM 8.5*  < > 8.7* 8.5* 8.1*  MG 2.2  --   --  2.2  --   PHOS 3.1  --   --  3.6  --   < > = values in this interval not displayed.  CBC  Recent Labs Lab 08/19/16 2340 08/20/16 0415 08/21/16 0445  WBC 19.0* 14.0* 12.1*  HGB 12.4 11.8* 10.0*  HCT 37.1 34.0* 28.6*  PLT 217 192 181    Coag's No results for input(s): APTT, INR in the last  168 hours.  Sepsis Markers  Recent Labs Lab 08/20/16 0004 08/20/16 0415  LATICACIDVEN 1.1 1.7    ABG  Recent Labs Lab 08/16/16 2203 08/17/16 0503 08/20/16 0130  PHART 7.50* 7.48* 7.32*  PCO2ART 32 31* 50*  PO2ART 119* 127* 104    Liver Enzymes  Recent Labs Lab 08/17/16 0319  AST 21  ALT 26  ALKPHOS 61  BILITOT 0.4  ALBUMIN 3.4*    Cardiac Enzymes  Recent Labs Lab 08/19/16 2340  TROPONINI <0.03    Glucose  Recent Labs Lab 08/16/16 2324 08/20/16 0245 08/20/16 1220 08/20/16 1932 08/20/16 2335 08/21/16 0346  GLUCAP 88 166* 79 156* 92 80    Imaging No results found.   STUDIES:  None  CULTURES: 08/20/2016 Blood x 2 Urine  Sputum  ANTIBIOTICS: None  SIGNIFICANT  EVENTS: 09/08>Disharged home 09/9>ED with low pressure alarms on home vent, hypercarbic encephalopathy  LINES/TUBES: Trach PIVs  DISCUSSION: 45 year old female with PMH of ALS, chronic respiratory failure on vent and peg, presenting with acute hypercarbic encephalopathy/CO2 narcosis due cuff leak. Admitted for airway management until custom trach is available   ASSESSMENT / PLAN:  PULMONARY A: VDRF ALS Persistent cuff leak -Tolerating hospital vent but still has intermittent low pressure alrms P:   Continue full vent support with current settings-PRVC /fiO2 30/peep 5 /RR 18/TV 450; no weaning Keep O2 sats >94% F/u ABG Await custom trach ENT re-consulted Trach care per protocol Patient will need to be transferred to [facility] if family wants to continue with aggressive measures.  Home vent tried but had significant air leak. Given that she is improved and stable on the hospital vent, her home vent will need to be check prior to discharge. See Dr. Carolyn Stareamachandran's addendum note from 08/20/16    GASTROINTESTINAL A:   S/P gastrotomy tube 2/2 ALS.  High risk for constipation P:   Continue tube feeds per protocol  Pepcid for gi prphylaxis Miralax 17 grams daily and dulcolax suppo prn  HEMATOLOGIC A:   No active issues P:   transfuse if HgB<7 Heparin for DVT prophylaxis  INFECTIOUS A:   Leukocytosis-improving; no fever; CXR without acute changes GPC in cluster and gram variable rods in sputum P:   Started on vancomycin by elink MD.  Will obtain procalcitonin and if normal, will discontinue antibiotics Monitor fever curve Sputum, blood and urine cultures  ENDOCRINE A:   No active issues P:   Blood sugar checks with BMP  NEUROLOGIC A: CO2 narcosis-resolved with vent support  Hx of depression Hx of Anxiety Hx ALS P:   RASS goal: 0 Continue home dose of celexa, clonazepam, welbutrin and lyrica Supportive care  Disposition and Family update: No family at  bedside. Will update when available   DR. Ramachandran's ADDENDUM TO 08/20/16 NOTE : "Discussed case with Dr. Andee PolesVaught (ENT). 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  In the meantime, we will ask family to bring in home vent so that we can try her on it and see if that may be a cause of the problem, as she seemed to improve after she was switched to hospital vent. In addition we should readdress goals of care as she may be at the end of what we can do for her.   If the patient/family desires continued aggressive care, they  may opt to be transferred to [facility] where she gets some of her care. She can be seen by ENT to see if there are other solutions or interventional pulmonology for potential stenting which would treat the tracheomalacia and hopefully allow the trach to stay in place without further leaking.  -Wells Guiles, M.D"    Total CCM time=35 MINUTES  Otisha Spickler S. [facility] ANP-BC Pulmonary and Critical Care Medicine [facility] Pager [phone] or [phone] 08/21/2016, 6:27 AM

## 2016-08-22 LAB — BASIC METABOLIC PANEL
Anion gap: 2 — ABNORMAL LOW (ref 5–15)
BUN: 13 mg/dL (ref 6–20)
CHLORIDE: 112 mmol/L — AB (ref 101–111)
CO2: 23 mmol/L (ref 22–32)
Calcium: 7.9 mg/dL — ABNORMAL LOW (ref 8.9–10.3)
Glucose, Bld: 144 mg/dL — ABNORMAL HIGH (ref 65–99)
Potassium: 3.8 mmol/L (ref 3.5–5.1)
Sodium: 137 mmol/L (ref 135–145)

## 2016-08-22 LAB — GLUCOSE, CAPILLARY
GLUCOSE-CAPILLARY: 117 mg/dL — AB (ref 65–99)
GLUCOSE-CAPILLARY: 158 mg/dL — AB (ref 65–99)
GLUCOSE-CAPILLARY: 110 mg/dL — AB (ref 65–99)
Glucose-Capillary: 177 mg/dL — ABNORMAL HIGH (ref 65–99)
Glucose-Capillary: 149 mg/dL — ABNORMAL HIGH (ref 65–99)
Glucose-Capillary: 45 mg/dL — ABNORMAL LOW (ref 65–99)
Glucose-Capillary: 53 mg/dL — ABNORMAL LOW (ref 65–99)
Glucose-Capillary: 162 mg/dL — ABNORMAL HIGH (ref 65–99)

## 2016-08-22 LAB — CBC
HCT: 29.4 % — ABNORMAL LOW (ref 35.0–47.0)
Hemoglobin: 10.3 g/dL — ABNORMAL LOW (ref 12.0–16.0)
MCH: 34 pg (ref 26.0–34.0)
MCHC: 35.1 g/dL (ref 32.0–36.0)
MCV: 97.1 fL (ref 80.0–100.0)
PLATELETS: 211 10*3/uL (ref 150–440)
RBC: 3.03 MIL/uL — ABNORMAL LOW (ref 3.80–5.20)
RDW: 12.7 % (ref 11.5–14.5)
WBC: 9.5 10*3/uL (ref 3.6–11.0)

## 2016-08-22 MED ORDER — DEXTROSE 50 % IV SOLN
1.0000 | Freq: Once | INTRAVENOUS | Status: AC
  Administered 2016-08-22: 50 mL via INTRAVENOUS

## 2016-08-22 MED ORDER — DEXTROSE 50 % IV SOLN
INTRAVENOUS | Status: AC
  Filled 2016-08-22: qty 50

## 2016-08-22 NOTE — Progress Notes (Signed)
Patient off vasopressor for a few hours- vitals stable.  Patient given rectal suppository and became impacted- I disimpacted her- large black stool- patient is taking iron.  Tube feeds held for a few hours this am due to patient having nausea- restarted around 1330.  Per Dr. Belia HemanKasa- do not change out inner cannula of trach when performing trach care. I had notified Dr. Belia HemanKasa of patient's nausea- order to continue to monitor.  Per Dr. Andee PolesVaught- still waiting on patient's custom-made trach- he states that he does not know when it will arrive.

## 2016-08-22 NOTE — Progress Notes (Signed)
PULMONARY / CRITICAL CARE MEDICINE   Name: Brenda Anderson MRN: [id number] DOB: 10-15-1971    ADMISSION DATE:  08/19/2016   REFERRING MD:  Dr. Scotty Court  CHIEF COMPLAINT:  Difficulty breathing   Patient Profile: 45 year old female with PMH of ALS, chronic respiratory failure on vent and peg, presenting with acute hypercarbic encephalopathy/CO2 narcosis due cuff leak. Admitted for airway management until custom trach is available  SUBJECTIVE: No acute issues overnight.. Blood Pressure remained low, therefore gave 1l of fluid bolus and started her on very low dose of neosynephrine.  VITAL SIGNS: BP (!) 92/57   Pulse 73   Temp 99 F (37.2 C) (Oral)   Resp 20   Ht 5\' 7"  (1.702 m)   Wt 61.7 kg (136 lb 0.4 oz)   SpO2 99%   BMI 21.30 kg/m   HEMODYNAMICS:    VENTILATOR SETTINGS: Vent Mode: PRVC FiO2 (%):  [25 %] 25 % Set Rate:  [20 bmp] 20 bmp Vt Set:  [400 mL-450 mL] 450 mL PEEP:  [5 cmH20] 5 cmH20  INTAKE / OUTPUT: I/O last 3 completed shifts: In: 4022.7 [I.V.:112.7; Other:240; NG/GT:970; IV Piggyback:2700] Out: -   PHYSICAL EXAMINATION: General:  Chronically ill-looking, NAD Neuro: Awake, can mouth words, paraplegic, nods to commands HEENT: [place]/AT, tracheostomy/trach, oral mucosa moist Cardiovascular: RRR, S1/S2, no MRG Lungs: CTAB, diminished breath sounds in the bases Abdomen: Soft, non-tender, non-distended, normal bowel sounds Musculoskeletal: Contractures-lower and upper extremities Extremities:+2 pulses Skin: Warm and dry  LABS:  BMET  Recent Labs Lab 08/19/16 2340 08/20/16 0415 08/21/16 0445  NA 138 141 140  K 4.6 4.7 3.7  CL 106 108 112*  CO2 29 29 25   BUN 20 18 13   CREATININE <0.30* <0.30* <0.30*  GLUCOSE 216* 101* 73    Electrolytes  Recent Labs Lab 08/17/16 0319  08/19/16 2340 08/20/16 0415 08/21/16 0445  CALCIUM 8.5*  < > 8.7* 8.5* 8.1*  MG 2.2  --   --  2.2  --   PHOS 3.1  --   --  3.6  --   < > = values in this interval not  displayed.  CBC  Recent Labs Lab 08/19/16 2340 08/20/16 0415 08/21/16 0445  WBC 19.0* 14.0* 12.1*  HGB 12.4 11.8* 10.0*  HCT 37.1 34.0* 28.6*  PLT 217 192 181    Coag's No results for input(s): APTT, INR in the last 168 hours.  Sepsis Markers  Recent Labs Lab 08/20/16 0004 08/20/16 0415 08/21/16 0445  LATICACIDVEN 1.1 1.7  --   PROCALCITON  --   --  <0.10    ABG  Recent Labs Lab 08/16/16 2203 08/17/16 0503 08/20/16 0130  PHART 7.50* 7.48* 7.32*  PCO2ART 32 31* 50*  PO2ART 119* 127* 104    Liver Enzymes  Recent Labs Lab 08/17/16 0319  AST 21  ALT 26  ALKPHOS 61  BILITOT 0.4  ALBUMIN 3.4*    Cardiac Enzymes  Recent Labs Lab 08/19/16 2340  TROPONINI <0.03    Glucose  Recent Labs Lab 08/21/16 1214 08/21/16 1611 08/21/16 1741 08/21/16 2007 08/21/16 2323 08/22/16 0405  GLUCAP 76 63* 95 169* 117* 177*    Imaging Dg Chest Port 1 View  Result Date: 08/21/2016 CLINICAL DATA:  Acute on chronic respiratory failure. History of ALS, the patient has a tracheostomy and PEG tube. EXAM: PORTABLE CHEST 1 VIEW COMPARISON:  Portable chest x-ray of August 19, 2016 FINDINGS: The lungs are adequately inflated. The interstitial markings are coarse just above  the mildly elevated right hemidiaphragm. The heart and pulmonary vascularity are normal. The mediastinum is normal in width. The tracheostomy appliance tip projects between the clavicular heads. The PEG tube tip is not visible in the limited view of the upper abdomen available. IMPRESSION: Atelectasis or infiltrate at the right lung base associated with the elevated right hemidiaphragm. No CHF or pleural effusion. Electronically Signed   By: David  SwazilandJordan M.D.   On: 08/21/2016 07:53     STUDIES:  None  CULTURES: 08/20/2016 Blood x 2 Urine  Sputum  ANTIBIOTICS: None  SIGNIFICANT EVENTS: 09/08>Disharged home 09/9>ED with low pressure alarms on home vent, hypercarbic  encephalopathy  LINES/TUBES: Trach PIVs   ASSESSMENT / PLAN:  PULMONARY A: VDRF ALS Persistent cuff leak -Tolerating hospital vent but still has intermittent low pressure alrms P:   Continue full vent support with current settings-PRVC /fiO2 30/peep 5 /RR 18/TV 450; no weaning Keep O2 sats >94% F/u ABG Await custom trach ENT re-consulted Trach care per protocol Patient will need to be transferred to [facility] if family wants to continue with aggressive measures.  Home vent tried but had significant air leak. Given that she is improved and stable on the hospital vent, her home vent will need to be check prior to discharge. See Dr. Carolyn Stareamachandran's addendum note from 08/20/16    GASTROINTESTINAL A:   S/P gastrotomy tube 2/2 ALS.  High risk for constipation P:   Continue tube feeds per protocol  Pepcid for gi prphylaxis Miralax 17 grams daily and dulcolax suppo prn  HEMATOLOGIC A:   No active issues P:   transfuse if HgB<7 Heparin for DVT prophylaxis  INFECTIOUS A:   Leukocytosis-improving; no fever; CXR without acute changes  P:   Monitor fever curve Follow Sputum, blood and urine cultures  ENDOCRINE A:   No active issues P:   Blood sugar checks with BMP  NEUROLOGIC A: CO2 narcosis-resolved with vent support  Hx of depression Hx of Anxiety Hx ALS P:   RASS goal: 0 Continue home dose of celexa, clonazepam, welbutrin and lyrica Supportive care  Disposition and Family update: No family at bedside. Will update when available       Tate Zagal,AG-ACNP Pulmonary & Critical Care

## 2016-08-22 NOTE — Care Management Note (Signed)
Received call from Selena BattenKim [facility] updqated on trach changed to size 8 cuff on 9.11.17 leak improved,awaiting custom trach anticipate delivery on 9.14.17. Plan wean off vasopressor.

## 2016-08-22 NOTE — Progress Notes (Signed)
.. 08/22/2016 1:10 PM  Brenda Anderson, Brenda Anderson [id number]    Temp:  [98.1 F (36.7 C)-99.7 F (37.6 C)] 98.1 F (36.7 C) (09/12 0800) Pulse Rate:  [67-89] 67 (09/12 1000) Resp:  [11-27] 20 (09/12 1000) BP: (75-111)/(43-65) 85/57 (09/12 1000) SpO2:  [98 %-100 %] 100 % (09/12 1229) FiO2 (%):  [25 %] 25 % (09/12 1229) Weight:  [64 kg (141 lb 1.5 oz)] 64 kg (141 lb 1.5 oz) (09/12 0700),     Intake/Output Summary (Last 24 hours) at 08/22/16 1310 Last data filed at 08/22/16 0907  Gross per 24 hour  Intake           702.02 ml  Output                0 ml  Net           702.02 ml    Results for orders placed or performed during the hospital encounter of 08/19/16 (from the past 24 hour(s))  Glucose, capillary     Status: Abnormal   Collection Time: 08/21/16  4:11 PM  Result Value Ref Range   Glucose-Capillary 63 (L) 65 - 99 mg/dL  Glucose, capillary     Status: None   Collection Time: 08/21/16  5:41 PM  Result Value Ref Range   Glucose-Capillary 95 65 - 99 mg/dL  Glucose, capillary     Status: Abnormal   Collection Time: 08/21/16  8:07 PM  Result Value Ref Range   Glucose-Capillary 169 (H) 65 - 99 mg/dL  Glucose, capillary     Status: Abnormal   Collection Time: 08/21/16 11:23 PM  Result Value Ref Range   Glucose-Capillary 117 (H) 65 - 99 mg/dL  Glucose, capillary     Status: Abnormal   Collection Time: 08/22/16  4:05 AM  Result Value Ref Range   Glucose-Capillary 177 (H) 65 - 99 mg/dL  Glucose, capillary     Status: Abnormal   Collection Time: 08/22/16  7:50 AM  Result Value Ref Range   Glucose-Capillary 149 (H) 65 - 99 mg/dL  Basic metabolic panel     Status: Abnormal   Collection Time: 08/22/16  8:47 AM  Result Value Ref Range   Sodium 137 135 - 145 mmol/L   Potassium 3.8 3.5 - 5.1 mmol/L   Chloride 112 (H) 101 - 111 mmol/L   CO2 23 22 - 32 mmol/L   Glucose, Bld 144 (H) 65 - 99 mg/dL   BUN 13 6 - 20 mg/dL   Creatinine, Ser <4.69 (L) 0.44 - 1.00 mg/dL   Calcium 7.9 (L)  8.9 - 10.3 mg/dL   GFR calc non Af Amer NOT CALCULATED >60 mL/min   GFR calc Af Amer NOT CALCULATED >60 mL/min   Anion gap 2 (L) 5 - 15  CBC     Status: Abnormal   Collection Time: 08/22/16  8:47 AM  Result Value Ref Range   WBC 9.5 3.6 - 11.0 K/uL   RBC 3.03 (L) 3.80 - 5.20 MIL/uL   Hemoglobin 10.3 (L) 12.0 - 16.0 g/dL   HCT 62.9 (L) 52.8 - 41.3 %   MCV 97.1 80.0 - 100.0 fL   MCH 34.0 26.0 - 34.0 pg   MCHC 35.1 32.0 - 36.0 g/dL   RDW 24.4 01.0 - 27.2 %   Platelets 211 150 - 440 K/uL  Glucose, capillary     Status: Abnormal   Collection Time: 08/22/16 11:41 AM  Result Value Ref Range   Glucose-Capillary 45 (L) 65 -  99 mg/dL  Glucose, capillary     Status: Abnormal   Collection Time: 08/22/16 11:44 AM  Result Value Ref Range   Glucose-Capillary 53 (L) 65 - 99 mg/dL    SUBJECTIVE:  Decrease in BP last night.  Improved cuff leak today.  No issues with ventilator overnight per chart/nursing.  Patient denies any issues.  OBJECTIVE:   GEN- supine and resting, responsive when aroused NECK-  Trach secure and in place with no audible leak  IMPRESSION:  ALS with tracheomalacia s/p Size 8 shiley placement  PLAN:  Improved leak.  Unsure when custom trach is to be delivered.  Sent on Thursday 9/7 but have heard nothing regarding status.  Can consider discharge prior to trach change if tolerates home ventilator overnight and family agreeable.  Mickey Hebel 08/22/2016, 1:10 PM

## 2016-08-23 LAB — CULTURE, RESPIRATORY

## 2016-08-23 LAB — GLUCOSE, CAPILLARY
GLUCOSE-CAPILLARY: 128 mg/dL — AB (ref 65–99)
Glucose-Capillary: 165 mg/dL — ABNORMAL HIGH (ref 65–99)
Glucose-Capillary: 119 mg/dL — ABNORMAL HIGH (ref 65–99)
Glucose-Capillary: 130 mg/dL — ABNORMAL HIGH (ref 65–99)
Glucose-Capillary: 104 mg/dL — ABNORMAL HIGH (ref 65–99)

## 2016-08-23 LAB — CULTURE, RESPIRATORY W GRAM STAIN

## 2016-08-23 MED ORDER — VANCOMYCIN HCL IN DEXTROSE 750-5 MG/150ML-% IV SOLN
750.0000 mg | Freq: Two times a day (BID) | INTRAVENOUS | Status: DC
  Administered 2016-08-23 – 2016-08-24 (×2): 750 mg via INTRAVENOUS
  Filled 2016-08-23 (×3): qty 150

## 2016-08-23 MED ORDER — SODIUM CHLORIDE 0.9 % IV BOLUS (SEPSIS)
1000.0000 mL | Freq: Once | INTRAVENOUS | Status: AC
  Administered 2016-08-23: 1000 mL via INTRAVENOUS

## 2016-08-23 NOTE — Progress Notes (Signed)
PULMONARY / CRITICAL CARE MEDICINE   Name: Brenda Anderson MRN: [id number] DOB: [birth year]    ADMISSION DATE:  08/19/2016   REFERRING MD:  Dr. Scotty CourtStafford  CHIEF COMPLAINT:  Difficulty breathing   Patient Profile: 45 year old female with PMH of ALS, chronic respiratory failure on vent and peg, presenting with acute hypercarbic encephalopathy/CO2 narcosis due cuff leak. Admitted for airway management  SUBJECTIVE: . Blood Pressure remained low, therefore gave 1l of fluid bolus .Patient was afebrile and had an uneventful night. Cuff leak has improved with #8 trach Remains on vasopressors-will wean off as tolerated   VITAL SIGNS: BP (!) 97/57   Pulse 100   Temp 99 F (37.2 C) (Oral)   Resp 20   Ht 5\' 7"  (1.702 m)   Wt 63 kg (138 lb 14.2 oz)   SpO2 98%   BMI 21.75 kg/m   HEMODYNAMICS:    VENTILATOR SETTINGS: Vent Mode: PRVC FiO2 (%):  [25 %] 25 % Set Rate:  [20 bmp] 20 bmp Vt Set:  [450 mL] 450 mL PEEP:  [5 cmH20] 5 cmH20 Plateau Pressure:  [17 cmH20] 17 cmH20  INTAKE / OUTPUT: I/O last 3 completed shifts: In: 3452.5 [I.V.:1891.5; NG/GT:1561] Out: 2 [Urine:1; Stool:1]  PHYSICAL EXAMINATION: General:  Chronically ill-looking, NAD Neuro: Awake, can mouth words, paraplegic, nods to commands HEENT: [place]/AT, tracheostomy/trach, oral mucosa moist Cardiovascular: RRR, S1/S2, no MRG Lungs: CTAB, diminished breath sounds in the bases Abdomen: Soft, non-tender, non-distended, normal bowel sounds Musculoskeletal: Contractures-lower and upper extremities Extremities:+2 pulses Skin: Warm and dry  LABS:  BMET  Recent Labs Lab 08/20/16 0415 08/21/16 0445 08/22/16 0847  NA 141 140 137  K 4.7 3.7 3.8  CL 108 112* 112*  CO2 29 25 23   BUN 18 13 13   CREATININE <0.30* <0.30* <0.30*  GLUCOSE 101* 73 144*    Electrolytes  Recent Labs Lab 08/17/16 0319  08/20/16 0415 08/21/16 0445 08/22/16 0847  CALCIUM 8.5*  < > 8.5* 8.1* 7.9*  MG 2.2  --  2.2  --   --   PHOS 3.1   --  3.6  --   --   < > = values in this interval not displayed.  CBC  Recent Labs Lab 08/20/16 0415 08/21/16 0445 08/22/16 0847  WBC 14.0* 12.1* 9.5  HGB 11.8* 10.0* 10.3*  HCT 34.0* 28.6* 29.4*  PLT 192 181 211    Coag's No results for input(s): APTT, INR in the last 168 hours.  Sepsis Markers  Recent Labs Lab 08/20/16 0004 08/20/16 0415 08/21/16 0445  LATICACIDVEN 1.1 1.7  --   PROCALCITON  --   --  <0.10    ABG  Recent Labs Lab 08/16/16 2203 08/17/16 0503 08/20/16 0130  PHART 7.50* 7.48* 7.32*  PCO2ART 32 31* 50*  PO2ART 119* 127* 104    Liver Enzymes  Recent Labs Lab 08/17/16 0319  AST 21  ALT 26  ALKPHOS 61  BILITOT 0.4  ALBUMIN 3.4*    Cardiac Enzymes  Recent Labs Lab 08/19/16 2340  TROPONINI <0.03    Glucose  Recent Labs Lab 08/22/16 1144 08/22/16 1329 08/22/16 1644 08/22/16 1943 08/22/16 2327 08/23/16 0335  GLUCAP 53* 117* 158* 110* 162* 165*    Imaging No results found.   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  Will get new home vent to assess resp status  Given that she is improved and stable on the hospital vent, her home vent will need to be check prior to discharge.   See Dr.  Carolyn Stare addendum note from 08/20/16    GASTROINTESTINAL A:   S/P gastrotomy tube 2/2 ALS.  High risk for constipation P:   Continue tube feeds per protocol  Pepcid for gi prphylaxis Miralax 17 grams daily and dulcolax suppo prn  HEMATOLOGIC A:   No active issues P:   transfuse if HgB<7 Heparin for DVT prophylaxis  INFECTIOUS A:   Leukocytosis-improving; no fever; CXR without acute changes  P:   Monitor fever curve Follow Sputum, blood and urine cultures  ENDOCRINE A:   No active issues P:   Blood sugar checks with BMP  NEUROLOGIC A: CO2 narcosis-resolved with vent support  Hx of depression Hx of Anxiety Hx ALS P:   RASS goal: 0 Continue home dose of celexa, clonazepam, welbutrin and lyrica Supportive care  CardioVascular-hypotension ?etiology On vasopressors -follow up cultures -consider midodrine therapy  I have personally obtained a history, examined the patient, evaluated Pertinent laboratory and RadioGraphic/imaging results, and  formulated the assessment and plan   The Patient requires high complexity decision making for assessment and support, frequent evaluation and titration of therapies. Prognosis is very poor, no chance of meaningful recovery    Lucie Leather, M.D.  [facility]  Medical Director [facility] Medical Director [facility] Cardio-Pulmonary Department

## 2016-08-23 NOTE — Progress Notes (Signed)
Per patient's brother the NP on ICU okd patient's HOB to be elevated < 30 degrees.  He and patient preferred HOB at 26 degrees, declined to raise HOB back to 30 degrees.  He states this is how the patient rests at home to alleviate neck pain

## 2016-08-23 NOTE — Progress Notes (Signed)
eLink Physician-Brief Progress Note Patient Name: Brenda Anderson DOB: [birth year] MRN: [id number]   Date of Service  08/23/2016  HPI/Events of Note  Microbiology notification: MRSA in sputum  eICU Interventions  Start vanc     Intervention Category Major Interventions: Infection - evaluation and management  Max FickleDouglas Wenceslao Loper 08/23/2016, 3:40 PM

## 2016-08-23 NOTE — Progress Notes (Signed)
Nutrition Follow-up  DOCUMENTATION CODES:   Not applicable  INTERVENTION:  -TF: recommend continuing current TF regimen; recommend HOB >30 degrees be maintained at all times during feedings.   NUTRITION DIAGNOSIS:   Inadequate oral intake related to inability to eat as evidenced by NPO status.  Being addressed via TF.   GOAL:   Patient will meet greater than or equal to 90% of their needs  MONITOR:   Labs, Skin, I & O's, Weight trends, Vent status, TF tolerance  REASON FOR ASSESSMENT:   Consult Enteral/tube feeding initiation and management  ASSESSMENT:   45 y/o female with ALS, chronic vent. Trach and peg who presents with worsening respiratory status and altered level of consciousness. History is obtained from EMS/ED records. Patient's nurse and mother stated that her home vent had low pressure alarms.Patient subsequently became less responsive and in severe respiratory distress   Pt remains on vent via trach, cuff leak improved. Awaiting custom trach delivery.   Pt tolerating 12 hour feedings of Jevity 1.5 at 75 ml/hr (9:00 to 21:00). Pt with reports of nausea but none today. Pt s/p disimpaction yesterday with large BM. Pt HOB >30 degrees on visit today but aware of family requesting that pt lay flat at times during feedings due to back pain  Diet Order:  Diet NPO time specified  Skin:  Reviewed, no issues  Last BM:  08/16/2016   Labs:  Glucose Profile:  Recent Labs  08/23/16 0335 08/23/16 0728 08/23/16 1140  GLUCAP 165* 119* 130*   Meds: NS at 75 ml/hr, dulcolax suppository prn, reglan, zofran and phenergan prn  Height:   Ht Readings from Last 1 Encounters:  08/20/16 5\' 7"  (1.702 m)    Weight:   Wt Readings from Last 1 Encounters:  08/23/16 138 lb 14.2 oz (63 kg)   Filed Weights   08/21/16 0500 08/22/16 0700 08/23/16 0500  Weight: 136 lb 0.4 oz (61.7 kg) 141 lb 1.5 oz (64 kg) 138 lb 14.2 oz (63 kg)    Ideal Body Weight:  61.36 kg  BMI:  Body  mass index is 21.75 kg/m.  Estimated Nutritional Needs:   Kcal:  1277 calories  Protein:  65-81 gm  Fluid:  >/= 1.2L  EDUCATION NEEDS:   No education needs identified at this time  Romelle StarcherCate Aunisty Reali MS, RD, LDN [phone] Pager  [phone] Weekend/On-Call Pager

## 2016-08-24 LAB — GLUCOSE, CAPILLARY
GLUCOSE-CAPILLARY: 92 mg/dL (ref 65–99)
Glucose-Capillary: 105 mg/dL — ABNORMAL HIGH (ref 65–99)
Glucose-Capillary: 110 mg/dL — ABNORMAL HIGH (ref 65–99)
Glucose-Capillary: 86 mg/dL (ref 65–99)

## 2016-08-24 LAB — PROCALCITONIN

## 2016-08-24 MED ORDER — HYDROCODONE-ACETAMINOPHEN 7.5-325 MG/15ML PO SOLN
10.0000 mL | ORAL | Status: DC | PRN
  Administered 2016-08-24 – 2016-08-27 (×6): 10 mL
  Filled 2016-08-24 (×7): qty 15

## 2016-08-24 MED ORDER — FENTANYL CITRATE (PF) 100 MCG/2ML IJ SOLN
50.0000 ug | INTRAMUSCULAR | Status: DC | PRN
  Administered 2016-08-24 (×2): 100 ug via INTRAVENOUS
  Administered 2016-08-25: 50 ug via INTRAVENOUS
  Administered 2016-08-25: 100 ug via INTRAVENOUS
  Administered 2016-08-25: 50 ug via INTRAVENOUS
  Administered 2016-08-26 – 2016-08-28 (×7): 100 ug via INTRAVENOUS
  Filled 2016-08-24 (×11): qty 2

## 2016-08-24 MED ORDER — ENOXAPARIN SODIUM 40 MG/0.4ML ~~LOC~~ SOLN
40.0000 mg | SUBCUTANEOUS | Status: DC
Start: 2016-08-24 — End: 2016-08-28
  Administered 2016-08-24 – 2016-08-27 (×5): 40 mg via SUBCUTANEOUS
  Filled 2016-08-24 (×5): qty 0.4

## 2016-08-24 MED ORDER — BENZOCAINE 20 % MT AERO
INHALATION_SPRAY | Freq: Three times a day (TID) | OROMUCOSAL | Status: DC | PRN
  Filled 2016-08-24: qty 57

## 2016-08-24 NOTE — Care Management (Addendum)
Trilogy is provided through [facility]. [facility] updated on patient status. RNCM will continue to follow. I'm [facility]Jason [facility] that patient's Trilogy was changed out on Monday 08/21/16.

## 2016-08-24 NOTE — Progress Notes (Signed)
Pharmacy Antibiotic Follow-up Note  Brenda Anderson is a 45 y.o. year-old female admitted on 08/19/2016.  The patient is currently on day 2 of vancomycin for MRSA in sputum.  Assessment/Plan: Patient with chronic trach and PCT < 0.1 x 2. After discussion with Dr. Nicholos Johnsamachandran, will d/c vancomycin.   Temp (24hrs), Avg:98.6 F (37 C), Min:98.4 F (36.9 C), Max:98.8 F (37.1 C)   Recent Labs Lab 08/18/16 0529 08/19/16 2340 08/20/16 0415 08/21/16 0445 08/22/16 0847  WBC 11.8* 19.0* 14.0* 12.1* 9.5    Recent Labs Lab 08/18/16 0529 08/19/16 2340 08/20/16 0415 08/21/16 0445 08/22/16 0847  CREATININE <0.30* <0.30* <0.30* <0.30* <0.30*   CrCl cannot be calculated (This lab value cannot be used to calculate CrCl because it is not a number: <0.30).    Allergies  Allergen Reactions  . Other     purfume  . Ultram [Tramadol Hcl]     Thank you for allowing pharmacy to be a part of this patient's care.  Luisa HartChristy, Shavon Zenz D PharmD 08/24/2016 4:25 PM

## 2016-08-24 NOTE — Progress Notes (Signed)
..   08/24/2016 5:11 PM  Sherryl BartersHensley, Brenda [id number]    Temp:  [98.4 F (36.9 C)-98.8 F (37.1 C)] 98.4 F (36.9 C) (09/14 1200) Pulse Rate:  [62-81] 70 (09/14 1600) Resp:  [18-29] 20 (09/14 1600) BP: (77-105)/(44-71) 82/50 (09/14 1600) SpO2:  [94 %-100 %] 100 % (09/14 1600) FiO2 (%):  [25 %] 25 % (09/14 1211) Weight:  [65.9 kg (145 lb 4.5 oz)] 65.9 kg (145 lb 4.5 oz) (09/14 0455),     Intake/Output Summary (Last 24 hours) at 08/24/16 1711 Last data filed at 08/24/16 1600  Gross per 24 hour  Intake             2670 ml  Output                0 ml  Net             2670 ml    Results for orders placed or performed during the hospital encounter of 08/19/16 (from the past 24 hour(s))  Glucose, capillary     Status: Abnormal   Collection Time: 08/23/16  7:58 PM  Result Value Ref Range   Glucose-Capillary 128 (H) 65 - 99 mg/dL  Glucose, capillary     Status: Abnormal   Collection Time: 08/24/16 12:43 AM  Result Value Ref Range   Glucose-Capillary 105 (H) 65 - 99 mg/dL  Glucose, capillary     Status: None   Collection Time: 08/24/16  4:04 AM  Result Value Ref Range   Glucose-Capillary 92 65 - 99 mg/dL  Glucose, capillary     Status: None   Collection Time: 08/24/16 11:40 AM  Result Value Ref Range   Glucose-Capillary 86 65 - 99 mg/dL  Procalcitonin     Status: None   Collection Time: 08/24/16  1:27 PM  Result Value Ref Range   Procalcitonin <0.10 ng/mL  Glucose, capillary     Status: Abnormal   Collection Time: 08/24/16  4:17 PM  Result Value Ref Range   Glucose-Capillary 110 (H) 65 - 99 mg/dL    SUBJECTIVE:  No acute events.  Intermittent leak with size 8 Shiley in place.  Reports needs suctioning.  OBJECTIVE:   Tracheoscopy:  After verbal consent obtained, flexible laryngoscope was placed through the patient's existing tracheostomy tube.  This demonstrated mucoid secretions in her left mainstem.  Bilateral collapse of tracheal walls with coughing and inspiration.  No  significant narrowing or scar.  Previously noted crusts and blood resolved.  IMPRESSION:  ALS with tracheomalacia  PLAN:  Discussed with patient changing out to size 7.5 custom siley extended length with low pressure cuff.  Patient wishes to wait until tomorrow morning at 8a.m. To do the trach change.  Discussed possibility that new trach actually does not work as well as existing and need to have Shiley 8 placed back in.  Patient demonstrates understanding.  Brenda Anderson 08/24/2016, 5:11 PM

## 2016-08-24 NOTE — Progress Notes (Signed)
eLink Physician-Brief Progress Note Patient Name: Brenda Anderson DOB: 1971-03-04 MRN: [id number]   Date of Service  08/24/2016  HPI/Events of Note  Notified by nurse the patient is complaining of back pain. Patient is receiving IV fentanyl but the effect is not lasting. Does have a PEG tube in place. Allergy to Ultram.   eICU Interventions   Continue fentanyl IV when necessary  Starting Hycet q4hr prn via tube     Intervention Category Intermediate Interventions: Pain - evaluation and management  Lawanda CousinsJennings Cailean Heacock 08/24/2016, 3:35 PM

## 2016-08-24 NOTE — Progress Notes (Signed)
PULMONARY / CRITICAL CARE MEDICINE   Name: Brenda Anderson MRN: [id number] DOB: Nov 06, 1971    ADMISSION DATE:  08/19/2016   REFERRING MD:  Dr. Scotty CourtStafford  CHIEF COMPLAINT:  Difficulty breathing   Patient Profile: 45 year old female with PMH of ALS, chronic respiratory failure on vent and peg, presenting with acute hypercarbic encephalopathy/CO2 narcosis due cuff leak. Admitted for airway management  SUBJECTIVE: . Blood Pressure remained low, therefore gave 1l of fluid bolus .Patient was afebrile and had an uneventful night. Cuff leak has improved with #8 trach vasopressors weaned off   VITAL SIGNS: BP (!) 84/53   Pulse 65   Temp 98.8 F (37.1 C) (Axillary)   Resp 20   Ht 5\' 7"  (1.702 m)   Wt 145 lb 4.5 oz (65.9 kg)   SpO2 100%   BMI 22.75 kg/m   HEMODYNAMICS:    VENTILATOR SETTINGS: Vent Mode: PRVC FiO2 (%):  [25 %] 25 % Set Rate:  [20 bmp] 20 bmp Vt Set:  [450 mL] 450 mL PEEP:  [5 cmH20] 5 cmH20 Plateau Pressure:  [15 cmH20-20 cmH20] 15 cmH20  INTAKE / OUTPUT: I/O last 3 completed shifts: In: 364621 [I.V.:2700; NG/GT:1621; IV Piggyback:300] Out: 2 [Urine:1; Stool:1]  PHYSICAL EXAMINATION: General:  Chronically ill-looking, NAD Neuro: Awake, can mouth words, paraplegic, nods to commands HEENT: [place]/AT, tracheostomy/trach, oral mucosa moist Cardiovascular: RRR, S1/S2, no MRG Lungs: CTAB, diminished breath sounds in the bases Abdomen: Soft, non-tender, non-distended, normal bowel sounds Musculoskeletal: Contractures-lower and upper extremities Extremities:+2 pulses Skin: Warm and dry  LABS:  BMET  Recent Labs Lab 08/20/16 0415 08/21/16 0445 08/22/16 0847  NA 141 140 137  K 4.7 3.7 3.8  CL 108 112* 112*  CO2 29 25 23   BUN 18 13 13   CREATININE <0.30* <0.30* <0.30*  GLUCOSE 101* 73 144*    Electrolytes  Recent Labs Lab 08/20/16 0415 08/21/16 0445 08/22/16 0847  CALCIUM 8.5* 8.1* 7.9*  MG 2.2  --   --   PHOS 3.6  --   --     CBC  Recent  Labs Lab 08/20/16 0415 08/21/16 0445 08/22/16 0847  WBC 14.0* 12.1* 9.5  HGB 11.8* 10.0* 10.3*  HCT 34.0* 28.6* 29.4*  PLT 192 181 211    Coag's No results for input(s): APTT, INR in the last 168 hours.  Sepsis Markers  Recent Labs Lab 08/20/16 0004 08/20/16 0415 08/21/16 0445  LATICACIDVEN 1.1 1.7  --   PROCALCITON  --   --  <0.10    ABG  Recent Labs Lab 08/20/16 0130  PHART 7.32*  PCO2ART 50*  PO2ART 104    Liver Enzymes No results for input(s): AST, ALT, ALKPHOS, BILITOT, ALBUMIN in the last 168 hours.  Cardiac Enzymes  Recent Labs Lab 08/19/16 2340  TROPONINI <0.03    Glucose  Recent Labs Lab 08/23/16 0728 08/23/16 1140 08/23/16 1622 08/23/16 1958 08/24/16 0043 08/24/16 0404  GLUCAP 119* 130* 104* 128* 105* 92    Imaging No results found.   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   See Dr. Carolyn Stare addendum note from 08/20/16    GASTROINTESTINAL A:   S/P gastrotomy tube 2/2 ALS.  High risk for constipation P:   Continue tube feeds per protocol  Pepcid for gi  prphylaxis Miralax 17 grams daily and dulcolax suppo prn  HEMATOLOGIC A:   No active issues P:   transfuse if HgB<7 Heparin for DVT prophylaxis  INFECTIOUS A:   Leukocytosis-improving; no fever; CXR without acute changes  P:   Monitor fever curve Follow Sputum, blood and urine cultures  ENDOCRINE A:   No active issues P:   Blood sugar checks with BMP  NEUROLOGIC A: CO2 narcosis-resolved with vent support  Hx of depression Hx of Anxiety Hx ALS P:   RASS goal: 0 Continue home dose of celexa, clonazepam, welbutrin and lyrica Supportive care  CardioVascular-hypotension ?etiology On vasopressors -follow up cultures -consider midodrine therapy  I have personally obtained a history, examined the patient, evaluated Pertinent laboratory and RadioGraphic/imaging results, and  formulated the assessment and plan   The Patient requires high complexity decision making for assessment and support, frequent evaluation and titration of therapies. Prognosis is very poor, no chance of meaningful recovery    Lucie Leather, M.D.  [facility]  Medical Director [facility] Medical Director [place] Cardio-Pulmonary Department

## 2016-08-25 LAB — CBC
HEMATOCRIT: 28.4 % — AB (ref 35.0–47.0)
HEMOGLOBIN: 10.1 g/dL — AB (ref 12.0–16.0)
MCH: 34.5 pg — ABNORMAL HIGH (ref 26.0–34.0)
MCHC: 35.4 g/dL (ref 32.0–36.0)
MCV: 97.5 fL (ref 80.0–100.0)
Platelets: 241 10*3/uL (ref 150–440)
RBC: 2.91 MIL/uL — ABNORMAL LOW (ref 3.80–5.20)
RDW: 12.8 % (ref 11.5–14.5)
WBC: 9.4 10*3/uL (ref 3.6–11.0)

## 2016-08-25 LAB — GLUCOSE, CAPILLARY
GLUCOSE-CAPILLARY: 131 mg/dL — AB (ref 65–99)
GLUCOSE-CAPILLARY: 107 mg/dL — AB (ref 65–99)
Glucose-Capillary: 86 mg/dL (ref 65–99)
Glucose-Capillary: 70 mg/dL (ref 65–99)
Glucose-Capillary: 99 mg/dL (ref 65–99)

## 2016-08-25 LAB — CULTURE, BLOOD (ROUTINE X 2)
CULTURE: NO GROWTH
Culture: NO GROWTH

## 2016-08-25 NOTE — Progress Notes (Signed)
Pt has remained alert, nods appropriately, and is responsive to commands. Pt c/o 6/10 lower back pain this am. NSR on cardiac monitor. Lung sounds are clear to auscultation-sating 100% on 25% FiO2. Dr Andee PolesVaught changed trach this am. Pt currently ventilated on home ventilator. No air leak identified.

## 2016-08-25 NOTE — Progress Notes (Signed)
..   08/25/2016 8:12 AM  Brenda Anderson, Brenda Anderson [id number]    Temp:  [98.1 F (36.7 C)-99.9 F (37.7 C)] 98.1 F (36.7 C) (09/15 0800) Pulse Rate:  [64-81] 70 (09/15 0800) Resp:  [18-29] 18 (09/15 0800) BP: (73-113)/(42-71) 81/71 (09/15 0800) SpO2:  [94 %-100 %] 100 % (09/15 0800) FiO2 (%):  [25 %] 25 % (09/15 0800) Weight:  [63 kg (138 lb 14.2 oz)] 63 kg (138 lb 14.2 oz) (09/15 0507),     Intake/Output Summary (Last 24 hours) at 08/25/16 0812 Last data filed at 08/25/16 0600  Gross per 24 hour  Intake             2550 ml  Output                0 ml  Net             2550 ml    Results for orders placed or performed during the hospital encounter of 08/19/16 (from the past 24 hour(s))  Glucose, capillary     Status: None   Collection Time: 08/24/16 11:40 AM  Result Value Ref Range   Glucose-Capillary 86 65 - 99 mg/dL  Procalcitonin     Status: None   Collection Time: 08/24/16  1:27 PM  Result Value Ref Range   Procalcitonin <0.10 ng/mL  Glucose, capillary     Status: Abnormal   Collection Time: 08/24/16  4:17 PM  Result Value Ref Range   Glucose-Capillary 110 (H) 65 - 99 mg/dL  Glucose, capillary     Status: None   Collection Time: 08/25/16  4:37 AM  Result Value Ref Range   Glucose-Capillary 86 65 - 99 mg/dL  CBC     Status: Abnormal   Collection Time: 08/25/16  6:01 AM  Result Value Ref Range   WBC 9.4 3.6 - 11.0 K/uL   RBC 2.91 (L) 3.80 - 5.20 MIL/uL   Hemoglobin 10.1 (L) 12.0 - 16.0 g/dL   HCT 40.928.4 (L) 81.135.0 - 91.447.0 %   MCV 97.5 80.0 - 100.0 fL   MCH 34.5 (H) 26.0 - 34.0 pg   MCHC 35.4 32.0 - 36.0 g/dL   RDW 78.212.8 95.611.5 - 21.314.5 %   Platelets 241 150 - 440 K/uL  Glucose, capillary     Status: None   Collection Time: 08/25/16  7:52 AM  Result Value Ref Range   Glucose-Capillary 70 65 - 99 mg/dL    SUBJECTIVE:  No acute events.  Stable on size 8 shiley.  OBJECTIVE: GEN- supine and alert to questions, on vent NECK- shiley #8 in place with no leak  Procedure:   Tracheostomy change-  Size 7.5 extended length single cannula custom trach exchanged for size 8 shiley without difficulty.  Slight leak initially with coughing but resolved once cough improved.  IMPRESSION:  ALS with need for long term vent with tracheomalacia  Plan:   Recommend ordering X2 Size 8 Custom Shiley 100mm length (13 proximal and 87 distal) 90 degree angle single cannula as expected to over time develop leak similar to previous.  Patient is easy size 8 fit regular length shiley if leak increases.  Please reconsult if any concerns.  Brenda Anderson 08/25/2016, 8:12 AM

## 2016-08-25 NOTE — Care Management (Signed)
Per progression rounds today patient may be able to discharge to home tomorrow after Trilogy/new 7.5 trach is tried making sure it does not leak. I have notified EMS. I have notified [facility]. I have notified patient's mother Brenda Anderson [phone]. All agree with this plan. EMS packet created. Weekend RNCM notified also.

## 2016-08-25 NOTE — Progress Notes (Signed)
PULMONARY / CRITICAL CARE MEDICINE   Name: Brenda Anderson MRN: [id number] DOB: 1971/02/24    ADMISSION DATE:  08/19/2016   REFERRING MD:  Dr. Scotty Anderson  CHIEF COMPLAINT:  Difficulty breathing   Patient Profile: 45 year old female with PMH of ALS, chronic respiratory failure on vent and peg, presenting with acute hypercarbic encephalopathy/CO2 narcosis due cuff leak. Admitted for airway management  SUBJECTIVE: . Blood Pressure remained low, therefore gave 1l of fluid bolus .Patient was afebrile and had an uneventful night. Cuff leak has improved with #8 trach vasopressors weaned off ENT recs continue #8 Trach  Will place home vent an assess resp status   VITAL SIGNS: BP (!) 81/71 (BP Location: Left Arm)   Pulse 70   Temp 98.1 F (36.7 C) (Oral)   Resp 18   Ht 5\' 7"  (1.702 m)   Wt 138 lb 14.2 oz (63 kg)   SpO2 100%   BMI 21.75 kg/m   HEMODYNAMICS:    VENTILATOR SETTINGS: Vent Mode: PRVC FiO2 (%):  [25 %] 25 % Set Rate:  [20 bmp] 20 bmp Vt Set:  [450 mL] 450 mL PEEP:  [5 cmH20] 5 cmH20  INTAKE / OUTPUT: I/O last 3 completed shifts: In: 3675 [I.V.:2700; Other:120; NG/GT:705; IV Piggyback:150] Out: -   PHYSICAL EXAMINATION: General:  Chronically ill-looking, NAD Neuro: Awake, can mouth words, paraplegic, nods to commands HEENT: [place]/AT, tracheostomy/trach, oral mucosa moist Cardiovascular: RRR, S1/S2, no MRG Lungs: CTAB, diminished breath sounds in the bases Abdomen: Soft, non-tender, non-distended, normal bowel sounds Musculoskeletal: Contractures-lower and upper extremities Extremities:+2 pulses Skin: Warm and dry  LABS:  BMET  Recent Labs Lab 08/20/16 0415 08/21/16 0445 08/22/16 0847  NA 141 140 137  K 4.7 3.7 3.8  CL 108 112* 112*  CO2 29 25 23   BUN 18 13 13   CREATININE <0.30* <0.30* <0.30*  GLUCOSE 101* 73 144*    Electrolytes  Recent Labs Lab 08/20/16 0415 08/21/16 0445 08/22/16 0847  CALCIUM 8.5* 8.1* 7.9*  MG 2.2  --   --   PHOS  3.6  --   --     CBC  Recent Labs Lab 08/21/16 0445 08/22/16 0847 08/25/16 0601  WBC 12.1* 9.5 9.4  HGB 10.0* 10.3* 10.1*  HCT 28.6* 29.4* 28.4*  PLT 181 211 241    Coag's No results for input(s): APTT, INR in the last 168 hours.  Sepsis Markers  Recent Labs Lab 08/20/16 0004 08/20/16 0415 08/21/16 0445 08/24/16 1327  LATICACIDVEN 1.1 1.7  --   --   PROCALCITON  --   --  <0.10 <0.10    ABG  Recent Labs Lab 08/20/16 0130  PHART 7.32*  PCO2ART 50*  PO2ART 104    Liver Enzymes No results for input(s): AST, ALT, ALKPHOS, BILITOT, ALBUMIN in the last 168 hours.  Cardiac Enzymes  Recent Labs Lab 08/19/16 2340  TROPONINI <0.03    Glucose  Recent Labs Lab 08/24/16 0043 08/24/16 0404 08/24/16 1140 08/24/16 1617 08/25/16 0437 08/25/16 0752  GLUCAP 105* 92 86 110* 86 70    Imaging No results found.   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   See Dr. Carolyn Anderson's addendum note from 08/20/16    GASTROINTESTINAL A:   S/P gastrotomy tube 2/2 ALS.  High risk for constipation P:   Continue tube feeds per protocol  Pepcid for gi  prphylaxis Miralax 17 grams daily and dulcolax suppo prn  HEMATOLOGIC A:   No active issues P:   transfuse if HgB<7 Heparin for DVT prophylaxis  INFECTIOUS A:   Leukocytosis-improving; no fever; CXR without acute changes  P:   Monitor fever curve Follow Sputum, blood and urine cultures  ENDOCRINE A:   No active issues P:   Blood sugar checks with BMP  NEUROLOGIC A: CO2 narcosis-resolved with vent support  Hx of depression Hx of Anxiety Hx ALS P:   RASS goal: 0 Continue home dose of celexa, clonazepam, welbutrin and lyrica Supportive care   I have personally obtained a history, examined the patient, evaluated Pertinent laboratory and RadioGraphic/imaging results, and  formulated the assessment and plan   The Patient requires high complexity decision making for assessment and support, frequent evaluation and titration of therapies. Prognosis is very poor, no chance of meaningful recovery  Will need to assess home vent and assess for d/c home  Brenda Anderson, M.D.  [facility]  Medical Director [facility] Medical Director [facility]

## 2016-08-26 LAB — BASIC METABOLIC PANEL
Anion gap: 3 — ABNORMAL LOW (ref 5–15)
BUN: 13 mg/dL (ref 6–20)
CALCIUM: 8.3 mg/dL — AB (ref 8.9–10.3)
CO2: 25 mmol/L (ref 22–32)
Chloride: 111 mmol/L (ref 101–111)
GLUCOSE: 113 mg/dL — AB (ref 65–99)
Potassium: 3.7 mmol/L (ref 3.5–5.1)
Sodium: 139 mmol/L (ref 135–145)

## 2016-08-26 LAB — BLOOD GAS, VENOUS
ACID-BASE DEFICIT: 0.8 mmol/L (ref 0.0–2.0)
Bicarbonate: 30.9 mmol/L — ABNORMAL HIGH (ref 20.0–28.0)
FIO2: 32
O2 SAT: 42.2 %
PATIENT TEMPERATURE: 37
PCO2 VEN: 95 mmHg — AB (ref 44.0–60.0)
PO2 VEN: 33 mmHg (ref 32.0–45.0)
pH, Ven: 7.12 — CL (ref 7.250–7.430)

## 2016-08-26 LAB — GLUCOSE, CAPILLARY
GLUCOSE-CAPILLARY: 132 mg/dL — AB (ref 65–99)
GLUCOSE-CAPILLARY: 174 mg/dL — AB (ref 65–99)
GLUCOSE-CAPILLARY: 56 mg/dL — AB (ref 65–99)
GLUCOSE-CAPILLARY: 93 mg/dL (ref 65–99)
Glucose-Capillary: 111 mg/dL — ABNORMAL HIGH (ref 65–99)
Glucose-Capillary: 176 mg/dL — ABNORMAL HIGH (ref 65–99)
Glucose-Capillary: 61 mg/dL — ABNORMAL LOW (ref 65–99)
Glucose-Capillary: 74 mg/dL (ref 65–99)

## 2016-08-26 LAB — CBC
HCT: 29.1 % — ABNORMAL LOW (ref 35.0–47.0)
HEMOGLOBIN: 10.1 g/dL — AB (ref 12.0–16.0)
MCH: 34.2 pg — AB (ref 26.0–34.0)
MCHC: 34.8 g/dL (ref 32.0–36.0)
MCV: 98.4 fL (ref 80.0–100.0)
PLATELETS: 268 10*3/uL (ref 150–440)
RBC: 2.96 MIL/uL — ABNORMAL LOW (ref 3.80–5.20)
RDW: 13.2 % (ref 11.5–14.5)
WBC: 9.4 10*3/uL (ref 3.6–11.0)

## 2016-08-26 LAB — PHOSPHORUS: Phosphorus: 3.5 mg/dL (ref 2.5–4.6)

## 2016-08-26 LAB — MAGNESIUM: MAGNESIUM: 2.1 mg/dL (ref 1.7–2.4)

## 2016-08-26 MED ORDER — PANCRELIPASE (LIP-PROT-AMYL) 12000-38000 UNITS PO CPEP
12000.0000 [IU] | ORAL_CAPSULE | Freq: Once | ORAL | Status: AC
  Administered 2016-08-26: 12000 [IU] via ORAL
  Filled 2016-08-26: qty 1

## 2016-08-26 MED ORDER — SODIUM CHLORIDE 0.9 % IV BOLUS (SEPSIS)
1000.0000 mL | Freq: Once | INTRAVENOUS | Status: AC
  Administered 2016-08-26: 1000 mL via INTRAVENOUS

## 2016-08-26 MED ORDER — DEXTROSE 50 % IV SOLN
INTRAVENOUS | Status: AC
  Administered 2016-08-26: 50 mL via INTRAVENOUS
  Filled 2016-08-26: qty 50

## 2016-08-26 MED ORDER — DEXTROSE 50 % IV SOLN
1.0000 | Freq: Once | INTRAVENOUS | Status: AC
  Administered 2016-08-26: 50 mL via INTRAVENOUS

## 2016-08-26 MED ORDER — STERILE WATER FOR INJECTION IJ SOLN
INTRAMUSCULAR | Status: AC
  Administered 2016-08-26: 20:00:00
  Filled 2016-08-26: qty 10

## 2016-08-26 MED ORDER — DEXTROSE 50 % IV SOLN
INTRAVENOUS | Status: AC
  Filled 2016-08-26: qty 50

## 2016-08-26 NOTE — Progress Notes (Signed)
BP soft.  Monitored closely.  BP remained soft.  Informed Magdelena NP.  Orders for 1L bolus received.  Will continue to monitor.

## 2016-08-26 NOTE — Progress Notes (Signed)
PULMONARY / CRITICAL CARE MEDICINE   Name: Brenda Anderson MRN: [id number] DOB: February 01, 1971    ADMISSION DATE:  08/19/2016   REFERRING MD:  Dr. Scotty CourtStafford  CHIEF COMPLAINT:  Difficulty breathing   Patient Profile: 45 year old female with PMH of ALS, chronic respiratory failure on vent and peg, presenting with acute hypercarbic encephalopathy/CO2 narcosis due cuff leak. Admitted for airway management  SUBJECTIVE: . Blood Pressure remained low, therefore gave 1l of fluid bolus .Patient was afebrile and had an uneventful night. Cuff leak has improved with #8 trach vasopressors weaned off ENT recs continue #8 Trach  Tolerating home vent settings Will need to prepare d/c home Labile BP-but alert and awake and mentating  VITAL SIGNS: BP (!) 90/55   Pulse 66   Temp 98.3 F (36.8 C)   Resp 12   Ht 5\' 7"  (1.702 m)   Wt 140 lb 3.4 oz (63.6 kg)   SpO2 100%   BMI 21.96 kg/m   HEMODYNAMICS:    VENTILATOR SETTINGS: Vent Mode: AC FiO2 (%):  [24 %-25 %] 24 % Set Rate:  [12 bmp-20 bmp] 12 bmp Vt Set:  [450 mL] 450 mL PEEP:  [5 cmH20] 5 cmH20 Pressure Support:  [5 cmH20] 5 cmH20  INTAKE / OUTPUT: I/O last 3 completed shifts: In: 1700 [I.V.:825; NG/GT:875] Out: -   PHYSICAL EXAMINATION: General:  Chronically ill-looking, NAD Neuro: Awake, can mouth words, paraplegic, nods to commands HEENT: [place]/AT, tracheostomy/trach, oral mucosa moist Cardiovascular: RRR, S1/S2, no MRG Lungs: CTAB, diminished breath sounds in the bases Abdomen: Soft, non-tender, non-distended, normal bowel sounds Musculoskeletal: Contractures-lower and upper extremities Extremities:+2 pulses Skin: Warm and dry  LABS:  BMET  Recent Labs Lab 08/21/16 0445 08/22/16 0847 08/26/16 0532  NA 140 137 139  K 3.7 3.8 3.7  CL 112* 112* 111  CO2 25 23 25   BUN 13 13 13   CREATININE <0.30* <0.30* <0.30*  GLUCOSE 73 144* 113*    Electrolytes  Recent Labs Lab 08/20/16 0415 08/21/16 0445 08/22/16 0847  08/26/16 0532  CALCIUM 8.5* 8.1* 7.9* 8.3*  MG 2.2  --   --  2.1  PHOS 3.6  --   --  3.5    CBC  Recent Labs Lab 08/22/16 0847 08/25/16 0601 08/26/16 0532  WBC 9.5 9.4 9.4  HGB 10.3* 10.1* 10.1*  HCT 29.4* 28.4* 29.1*  PLT 211 241 268    Coag's No results for input(s): APTT, INR in the last 168 hours.  Sepsis Markers  Recent Labs Lab 08/20/16 0004 08/20/16 0415 08/21/16 0445 08/24/16 1327  LATICACIDVEN 1.1 1.7  --   --   PROCALCITON  --   --  <0.10 <0.10    ABG  Recent Labs Lab 08/20/16 0130  PHART 7.32*  PCO2ART 50*  PO2ART 104    Liver Enzymes No results for input(s): AST, ALT, ALKPHOS, BILITOT, ALBUMIN in the last 168 hours.  Cardiac Enzymes  Recent Labs Lab 08/19/16 2340  TROPONINI <0.03    Glucose  Recent Labs Lab 08/25/16 2002 08/26/16 0002 08/26/16 0053 08/26/16 0406 08/26/16 0451 08/26/16 0710  GLUCAP 107* 56* 176* 61* 174* 74    Imaging No results found.   STUDIES:  None  CULTURES: 08/20/2016  9/10Blood x 2>>  9/10 Urine >> 9/10 Sputum>>  ANTIBIOTICS: completed  SIGNIFICANT EVENTS: 09/08>Disharged home 09/9>ED with low pressure alarms on home vent, hypercarbic encephalopathy 9/11 trach changed by ENT to #8  LINES/TUBES: Trach PIVs   ASSESSMENT / PLAN: 45 yo white  female with end stage ALS with chronic resp failure with severe tracheomalacia with air leak, s/p trach change by ENT Chronic resp failure   PULMONARY A: VDRF ALS Persistent cuff leak -resolved P:   Continue full vent support with current settings-PRVC /fiO2 30/peep 5 /RR 18/TV 450; no weaning Keep O2 sats >94% F/u ABG as needed ENT consulted Trach care per protocol Patient tolerating home vent  Given that she is improved and stable on the home vent, she can be taken back home once nurse available at home       GASTROINTESTINAL A:   S/P gastrotomy tube 2/2 ALS.  High risk for constipation P:   Continue tube feeds per protocol   Pepcid for gi prphylaxis Miralax 17 grams daily and dulcolax suppo prn  HEMATOLOGIC A:   No active issues P:   transfuse if HgB<7 Heparin for DVT prophylaxis  INFECTIOUS A:   Leukocytosis-improving; no fever; CXR without acute changes  P:   Monitor fever curve Follow Sputum, blood and urine cultures  ENDOCRINE A:   No active issues P:   Blood sugar checks with BMP  NEUROLOGIC A: CO2 narcosis-resolved with vent support  Hx of depression Hx of Anxiety Hx ALS P:   RASS goal: 0 Continue home dose of celexa, clonazepam, welbutrin and lyrica Supportive care   I have personally obtained a history, examined the patient, evaluated Pertinent laboratory and RadioGraphic/imaging results, and  formulated the assessment and plan   The Patient requires high complexity decision making for assessment and support, frequent evaluation and titration of therapies. Prognosis is very poor, no chance of meaningful recovery  Tolerating Home vent with current 7.5 Trach in place D/c planning to assess home status and discharge instructions  Denym Rahimi [name], M.D.  [facility]  Medical Director [facility] Medical Director [facility] Cardio-Pulmonary Department

## 2016-08-26 NOTE — Progress Notes (Signed)
Dr Belia HemanKasa notified of patients blood pressure in the 70's systolic and MAP of 50's. At this point he is not concerned and no additional orders.

## 2016-08-26 NOTE — Progress Notes (Signed)
0400 FSBS at 61.  1 amp D50 administered per protocol.  Will continue to monitor.

## 2016-08-26 NOTE — Progress Notes (Signed)
FSBS found at 56.  1 amp D50 administered per protocol.  NP informed.  Will continue to monitor.

## 2016-08-26 NOTE — Progress Notes (Signed)
Per Dr. Radene KneeVaughts note patient has a size 8 trach. Patient has tolerated vent. Minimal secretions. Tolerating tube feeds. Has been incontinent throughout the shift. Plan to discharge home tomorrow.

## 2016-08-26 NOTE — Care Management Note (Addendum)
Case Management Note  Patient Details  Name: Jennefer Bravolicia L Rideout MRN: [id number] Date of Birth: 12-Oct-1971  Subjective/Objective:       [facility] reports that there is no home health nurse available for today but a nurse will be available from 9am to 5pm on Sunday.  Plan is to discharge home tomorrow with [facility] health RN services [facility] that [facility] need an MD order for an RN, and trach care orders to include the following information: Trach Model and size, How much air is needed to inflate the cuff. Mrs Adron BeneHensley is going home with a Trilogy.              Action/Plan:   Expected Discharge Date:                  Expected Discharge Plan:     In-House Referral:     Discharge planning Services     Post Acute Care Choice:    Choice offered to:     DME Arranged:    DME Agency:     HH Arranged:    HH Agency:     Status of Service:     If discussed at [employer] Length of Stay Meetings, dates discussed:    Additional Comments:  Treylan Mcclintock A, RN 08/26/2016, 10:47 AM

## 2016-08-27 ENCOUNTER — Inpatient Hospital Stay: Payer: Medicare Other

## 2016-08-27 DIAGNOSIS — J398 Other specified diseases of upper respiratory tract: Secondary | ICD-10-CM

## 2016-08-27 DIAGNOSIS — G1221 Amyotrophic lateral sclerosis: Secondary | ICD-10-CM

## 2016-08-27 LAB — CBC
HCT: 28.6 % — ABNORMAL LOW (ref 35.0–47.0)
Hemoglobin: 9.9 g/dL — ABNORMAL LOW (ref 12.0–16.0)
MCH: 34 pg (ref 26.0–34.0)
MCHC: 34.5 g/dL (ref 32.0–36.0)
MCV: 98.6 fL (ref 80.0–100.0)
PLATELETS: 288 10*3/uL (ref 150–440)
RBC: 2.9 MIL/uL — AB (ref 3.80–5.20)
RDW: 13.2 % (ref 11.5–14.5)
WBC: 11.2 10*3/uL — AB (ref 3.6–11.0)

## 2016-08-27 LAB — BASIC METABOLIC PANEL
ANION GAP: 3 — AB (ref 5–15)
BUN: 12 mg/dL (ref 6–20)
CO2: 28 mmol/L (ref 22–32)
Calcium: 8.2 mg/dL — ABNORMAL LOW (ref 8.9–10.3)
Chloride: 108 mmol/L (ref 101–111)
Creatinine, Ser: <0.3 mg/dL — ABNORMAL LOW (ref 0.44–1.00)
GLUCOSE: 73 mg/dL (ref 65–99)
POTASSIUM: 4.2 mmol/L (ref 3.5–5.1)
Sodium: 139 mmol/L (ref 135–145)

## 2016-08-27 LAB — GLUCOSE, CAPILLARY
GLUCOSE-CAPILLARY: 79 mg/dL (ref 65–99)
GLUCOSE-CAPILLARY: 73 mg/dL (ref 65–99)
GLUCOSE-CAPILLARY: 77 mg/dL (ref 65–99)
GLUCOSE-CAPILLARY: 99 mg/dL (ref 65–99)
Glucose-Capillary: 101 mg/dL — ABNORMAL HIGH (ref 65–99)
Glucose-Capillary: 122 mg/dL — ABNORMAL HIGH (ref 65–99)

## 2016-08-27 MED ORDER — PANCRELIPASE (LIP-PROT-AMYL) 12000-38000 UNITS PO CPEP
ORAL_CAPSULE | Freq: Once | ORAL | Status: AC
  Administered 2016-08-27: 03:00:00

## 2016-08-27 MED ORDER — DOXYCYCLINE HYCLATE 100 MG PO TABS
100.0000 mg | ORAL_TABLET | Freq: Two times a day (BID) | ORAL | Status: DC
  Administered 2016-08-27 – 2016-08-28 (×3): 100 mg
  Filled 2016-08-27 (×3): qty 1

## 2016-08-27 MED ORDER — WATER FOR IRRIGATION, STERILE IR SOLN
Freq: Once | Status: DC
  Filled 2016-08-27: qty 1

## 2016-08-27 NOTE — Care Management Note (Addendum)
Case Management Note  Patient Details  Name: Jennefer Bravolicia L Krizan MRN: [id number] Date of Birth: 15-May-1971  Subjective/Objective:      Trach model=Coviden Shiley Adult Trach single cannula, 100ml length. Size 7.5. Low pressure cuff. Amount of air to inflate cuff 2-3 cc air as needed to stop chronic air leak.               Action/Plan:   Expected Discharge Date:                  Expected Discharge Plan:     In-House Referral:     Discharge planning Services     Post Acute Care Choice:    Choice offered to:     DME Arranged:    DME Agency:     HH Arranged:    HH Agency:     Status of Service:     If discussed at [employer] Length of Stay Meetings, dates discussed:    Additional Comments:  Cailah Reach A, RN 08/27/2016, 9:00 AM

## 2016-08-27 NOTE — Progress Notes (Addendum)
Pt. Refused to have HOB above 20 degrees stating that it hurt her back too much.  This RN explained the RIF aspiration, pt. Mouthed that she understood but did not want her head elevated anymore than 20 degrees.  Pt. Stated this is where she keeps her head at home while on the vent. Pt. Left at 20 degrees.

## 2016-08-27 NOTE — Progress Notes (Signed)
PULMONARY / CRITICAL CARE MEDICINE   Name: Brenda Anderson MRN: [id number] DOB: 06-17-1971    ADMISSION DATE:  08/19/2016   REFERRING MD:  Dr. Scotty Court  CHIEF COMPLAINT:  Difficulty breathing   Patient Profile: 45 year old female with PMH of ALS, chronic respiratory failure on vent and peg, presenting with acute hypercarbic encephalopathy/CO2 narcosis due cuff leak. Admitted for airway management  SUBJECTIVE: . Blood Pressure remained low, therefore gave 1l of fluid bolus .Patient was afebrile and had an uneventful night. Cuff leak has improved with #8 trach vasopressors weaned off ENT recs continue #8 Trach  Tolerating home vent settings Will need to prepare d/c home Labile BP-but alert and awake and mentating Patient wants to lay at 20 degrees despite knowing risk of aspiration PEG tube  unclogged  VITAL SIGNS: BP (!) 78/46   Pulse 67   Temp 97.9 F (36.6 C) (Oral)   Resp 12   Ht 5\' 7"  (1.702 m)   Wt 149 lb 7.6 oz (67.8 kg)   LMP  (LMP Unknown) Comment: pt bed bound  SpO2 100%   BMI 23.41 kg/m   HEMODYNAMICS:    VENTILATOR SETTINGS: Vent Mode: AC FiO2 (%):  [24 %] 24 % Set Rate:  [12 bmp] 12 bmp Vt Set:  [450 mL] 450 mL PEEP:  [4.9 cmH20-5 cmH20] 5 cmH20  INTAKE / OUTPUT: I/O last 3 completed shifts: In: 58 [I.V.:3675; Other:175] Out: -   PHYSICAL EXAMINATION: General:  Chronically ill-looking, NAD, chronic vent dependent Neuro: Awake, can mouth words, paraplegic, nods to commands HEENT: [place]/AT, tracheostomy/trach, oral mucosa moist Cardiovascular: RRR, S1/S2, no MRG Lungs: CTAB, diminished breath sounds in the bases Abdomen: Soft, non-tender, non-distended, normal bowel sounds Musculoskeletal: Contractures-lower and upper extremities Extremities:+2 pulses Skin: Warm and dry  LABS:  BMET  Recent Labs Lab 08/22/16 0847 08/26/16 0532 08/27/16 0403  NA 137 139 139  K 3.8 3.7 4.2  CL 112* 111 108  CO2 23 25 28   BUN 13 13 12   CREATININE  <0.30* <0.30* <0.30*  GLUCOSE 144* 113* 73    Electrolytes  Recent Labs Lab 08/22/16 0847 08/26/16 0532 08/27/16 0403  CALCIUM 7.9* 8.3* 8.2*  MG  --  2.1  --   PHOS  --  3.5  --     CBC  Recent Labs Lab 08/25/16 0601 08/26/16 0532 08/27/16 0403  WBC 9.4 9.4 11.2*  HGB 10.1* 10.1* 9.9*  HCT 28.4* 29.1* 28.6*  PLT 241 268 288    Coag's No results for input(s): APTT, INR in the last 168 hours.  Sepsis Markers  Recent Labs Lab 08/21/16 0445 08/24/16 1327  PROCALCITON <0.10 <0.10     Glucose  Recent Labs Lab 08/26/16 0710 08/26/16 1114 08/26/16 1628 08/26/16 2224 08/27/16 0354 08/27/16 0739  GLUCAP 74 111* 132* 93 79 73     STUDIES:  None  CULTURES: 08/20/2016  9/10Blood x 2>>  9/10 Urine >> 9/10 Sputum>>  ANTIBIOTICS: completed  SIGNIFICANT EVENTS: 09/08>Disharged home 09/9>ED with low pressure alarms on home vent, hypercarbic encephalopathy 9/11 trach changed by ENT to #8  LINES/TUBES: Trach PIVs   ASSESSMENT / PLAN: 45 yo white female with end stage ALS with chronic resp failure with severe tracheomalacia with air leak, s/p trach change by ENT With  Recent MRSA pneumonia/tracheitis    Chronic resp failure  Plan to assess patient current trach 7.5 and assess home vent settings and assess for d/c home in 1-2 days   PULMONARY A: VDRF ALS  Persistent cuff leak -resolved P:   Continue full vent support with current settings-PRVC /fiO2 30/peep 5 /RR 18/TV 450; no weaning Keep O2 sats >94% F/u ABG as needed ENT consulted Trach care per protocol Patient tolerating home vent  Given that she is improved and stable on the home vent, she can be taken back home once nurse available at home   GASTROINTESTINAL A:   S/P gastrotomy tube 2/2 ALS.  High risk for constipation P:   Continue tube feeds per protocol  Pepcid for gi prphylaxis Miralax 17 grams daily and dulcolax suppo prn  HEMATOLOGIC A:   No active issues P:    transfuse if HgB<7 Heparin for DVT prophylaxis  INFECTIOUS A:   Leukocytosis-improving; no fever; CXR without acute changes  P:   Monitor fever curve Follow Sputum culture pos for MRSA recieved vancomycin for 4 days will start doxycycline per PEGfor another 5 days  ENDOCRINE A:   No active issues P:   Blood sugar checks with BMP  NEUROLOGIC A: CO2 narcosis-resolved with vent support  Hx of depression Hx of Anxiety Hx ALS P:   RASS goal: 0 Continue home dose of celexa, clonazepam, welbutrin and lyrica Supportive care   I have personally obtained a history, examined the patient, evaluated Pertinent laboratory and RadioGraphic/imaging results, and  formulated the assessment and plan   The Patient requires high complexity decision making for assessment and support, frequent evaluation and titration of therapies. Prognosis is very poor, no chance of meaningful recovery  Tolerating Home vent with current 7.5 Trach in place D/c planning to assess home status and discharge instructions  Melven Stockard Santiago Gladavid Kinley Ferrentino, M.D.  [facility]  Medical Director [facility] Medical Director [facility]

## 2016-08-27 NOTE — Progress Notes (Signed)
PEG tube occluded, attempted flushing and aspirating, consulted charge RN and Pharmacy. Pharmacy suggested CREON PRN. Instilled for 30 minutes as instructed. Holding scheduled meds until PEG tube is patent.

## 2016-08-27 NOTE — Care Management Note (Signed)
Case Management Note  Patient Details  Name: Jennefer Bravolicia L Dalziel MRN: [id number] Date of Birth: [birth year]  Subjective/Objective:   Discussed discharge planning with Ms Pitney's ICU nurse. A [facility] home nurse is lined up, all requested trach information requested by [facility] is in the case manager notes. Trilogy is charged and at bedside. No discharge to home orders given by MD today per ICU nurse. Cal[facility] Bradford[facility]berly at [facility] was updated that Ms Adron BeneHensley would not be discharging home today per Dr Belia HemanKasa.        Action/Plan:   Expected Discharge Date:                  Expected Discharge Plan:     In-House Referral:     Discharge planning Services     Post Acute Care Choice:    Choice offered to:     DME Arranged:    DME Agency:     HH Arranged:    HH Agency:     Status of Service:     If discussed at [employer] Length of Stay Meetings, dates discussed:    Additional Comments:  Laini Urick A, RN 08/27/2016, 1:18 PM

## 2016-08-27 NOTE — Discharge Summary (Signed)
Physician Discharge Summary  Patient ID: Brenda Anderson MRN: [id number] DOB/AGE: 12/21/1970 45 y.o.  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 Trach and peg who presents with worsening respiratory status and altered level of consciousness. History is obtained from EMS/ED records. Patient's nurse and mother stated that her home vent had low pressure alarms.Patient subsequently became less responsive and in severe respiratory distress. Hence she was brought back to the ED. At the ED, a venous blood gas showed a pH of 7.12, PCO2 95, PO2 33, HCO3 30.9. She was placed on the hospital vent and no cuff leak or low pressure alarms were noted. Repeat ABG shows  PH 7.32, PCO2 50, PO2 104, HCO3 25.8, O2 saturation 97.5% See h&p   Active Problems:   Acute on chronic respiratory failure with hypercapnia [facility])   Discharged Condition: stable  Hospital Course: Patient was placed on hospital vent while awaiting special trach. Her PCO2 improved. Her trach leak was evaluated by ENT. 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 ENT changed her trach to a size 8 regular length and then further changed this to a custom 7.5 Trach. ENT advised that patient should have a spare size 7.5 custom shiley that family can use as back-up. Custom trach placed on 9/15. No cuff leak noted post trach replacement. Patient was placed on her home vent to evaluate for any vent malfunction prior to discharge. She has been tolerating her home vent. She was mainatined on the VAP protocol through out her hospitalization. ENT has suggested that at some point in the future she might need a #8 custom shiley, but this can be addressed as an outpatient.   She had  an elevated WBC upon admission. Her sputum culture grew MRSA and she was treated with IV vancomycin. She remained afebrile and vancomycin was discontinued and changed to doxycycline.  She also had episodes of hypotension that were treated with fluid boluses. Her blood pressure has improved and her MAPs are now between 60 and 70  Patient will require a total of 10 days of doxycyline for MRSA in the sputum, she has already gotten 2 days, and will be discharge with 8 days of Doxycycline (100mg  PO BID x 8 days).    Consults: ENT  Significant Diagnostic Studies: microbiology: sputum culture: positive for MRSA and radiology: positive for mrsa  Treatments: IV hydration, antibiotics: vancomycin, respiratory therapy: O2, albuterol/atropine nebulizer, chest PT and aggressive suctioningh and procedures: trach replacement  Discharge Exam: Blood pressure (!) 78/46, pulse 67, temperature 97.9 F (36.6 C), temperature source Oral, resp. rate 12, height 5\' 7"  (1.702 m), weight 149 lb 7.6 oz (67.8 kg), SpO2 100 %. General appearance: alert and cooperative Head: Normocephalic, without obvious abnormality, atraumatic, sinuses nontender to percussion Eyes: conjunctivae/corneas clear. PERRL, EOM's intact. Fundi benign. Ears: normal TM's and external ear canals both ears Nose: Nares normal. Septum midline. Mucosa normal. No drainage or sinus tenderness., no discharge Throat: lips, mucosa, and tongue normal; teeth and gums normal Neck: no adenopathy, no carotid bruit, no JVD, supple, symmetrical, trachea midline, thyroid not enlarged, symmetric, no tenderness/mass/nodules and tracheostomy Resp: diminished breath sounds bibasilar and rhonchi bilaterally Cardio: regular rate and rhythm, S1, S2 normal, no murmur, click, rub or gallop GI: soft, non-tender; bowel sounds normal;  no masses,  no organomegaly Extremities: extremities normal, atraumatic, no cyanosis or edema Pulses: 2+ and symmetric Skin: Skin color,  texture, turgor normal. No rashes or lesions Lymph nodes: Cervical, supraclavicular, and axillary nodes normal. Neurologic: Alert and oriented X 3, bed bound, contractures in lower extremities, paraplegic  Disposition: 06-Home-Health Care Svc    Discharge medications:    Medication List    TAKE these medications   ascorbic acid 250 MG tablet Commonly known as:  VITAMIN C Take 250 mg by mouth 2 (two) times daily.   atenolol 25 MG tablet Commonly known as:  TENORMIN Place 3 tablets into feeding tube daily.   buPROPion 100 MG tablet Commonly known as:  WELLBUTRIN Place 1 tablet into feeding tube 3 (three) times daily.   citalopram 20 MG tablet Commonly known as:  CELEXA Take 20 mg by mouth daily.   clonazePAM 0.5 MG tablet Commonly known as:  KLONOPIN Place 0.25 mg into feeding tube 2 (two) times daily as needed.   doxycycline 100 MG tablet Commonly known as:  VIBRA-TABS Place 1 tablet (100 mg total) into feeding tube 2 (two) times daily. Give 2 hours before or 4 hours after iron.   FERROCITE 324 (106 Fe) MG Tabs tablet Generic drug:  Ferrous Fumarate Take 1 tablet by mouth 2 (two) times daily.   loratadine 10 MG tablet Commonly known as:  CLARITIN Place 1 tablet into feeding tube daily.   Melatonin 1 MG/ML Liqd Place 8 mLs into feeding tube at bedtime.   polyethylene glycol packet Commonly known as:  MIRALAX / GLYCOLAX Take 17 g by mouth daily as needed.   pregabalin 50 MG capsule Commonly known as:  LYRICA Take 2 capsules by mouth daily.   ranitidine 15 MG/ML syrup Commonly known as:  ZANTAC Place 10 mLs into feeding tube daily.   Vitamin D3 1000 units Caps Place 1 capsule into feeding tube 2 (two) times daily.        Signed: Magdalene S. [facility] ANP-BC Pulmonary and Critical Care Medicine [facility] Pager [phone] or [phone] 08/27/2016, 8:54 AM   STAFF NOTE: I, Dr. Stephanie AcreVishal Zahki Hoogendoorn have personally reviewed patient's available data,  including medical history, events of note, physical examination and test results as part of my evaluation. I have discussed with NP Luci Bankukov  and other care providers such as pharmacist, RN and RRT.     A:45 year old female with PMH of ALS, chronic respiratory failure on vent and peg, presenting with acute hypercarbic encephalopathy/CO2 narcosis due cuff leak. Admitted for airway management   P:  Trach changed to 7.5 by ENT MRSA - PO doxycycline as stated above Tracheomalacia Resume Trach care Resume Home health nursing per previous discharge/physician orders.   .  Rest per NP/medical resident whose note is outlined above and that I agree with   Pulmonary Care Time devoted to patient care services described in this note is  45 Minutes.    Stephanie AcreVishal Diogenes Whirley, MD [facility] Pager [phone]- [phone] (please enter 7-digits) On Call Pager - [phone] (please enter 7-digits)  Note: This note was prepared with Dragon dictation along with smaller phrase technology. Any transcriptional errors that result from this process are unintentional.

## 2016-08-27 NOTE — Progress Notes (Signed)
Pt.'s son at bedside extremely concerned about pt. Being discharged tomorrow.  Pt. Will not have home health care overnight and son concerned that if pt. Does not tolerate new trach or develops a leak that the pt.'s mother will be ill-equipped for such a situation. This RN and RT gave pt. And pt.'s son emotional support.  This RN explained that no one is trying to "kick the pt. Out" as the son described.  It was reiterated that the pt. Was stable and tolerating home ventilator well.  Pt.'s son felt pt. Was coughing more and was not at ease about tomorrow. This RN stated she would document this conversation and pass his concerns along to the daytime RN and to Ms. Luci Bankukov, NP.

## 2016-08-28 ENCOUNTER — Inpatient Hospital Stay: Payer: Medicare Other

## 2016-08-28 LAB — BASIC METABOLIC PANEL
Anion gap: 3 — ABNORMAL LOW (ref 5–15)
BUN: 13 mg/dL (ref 6–20)
CO2: 28 mmol/L (ref 22–32)
Calcium: 8.2 mg/dL — ABNORMAL LOW (ref 8.9–10.3)
Chloride: 107 mmol/L (ref 101–111)
GLUCOSE: 106 mg/dL — AB (ref 65–99)
POTASSIUM: 4.1 mmol/L (ref 3.5–5.1)
Sodium: 138 mmol/L (ref 135–145)

## 2016-08-28 LAB — CBC
HCT: 29.5 % — ABNORMAL LOW (ref 35.0–47.0)
Hemoglobin: 10.1 g/dL — ABNORMAL LOW (ref 12.0–16.0)
MCH: 33.6 pg (ref 26.0–34.0)
MCHC: 34.1 g/dL (ref 32.0–36.0)
MCV: 98.5 fL (ref 80.0–100.0)
PLATELETS: 275 10*3/uL (ref 150–440)
RBC: 3 MIL/uL — AB (ref 3.80–5.20)
RDW: 13 % (ref 11.5–14.5)
WBC: 8 10*3/uL (ref 3.6–11.0)

## 2016-08-28 LAB — MAGNESIUM: Magnesium: 2 mg/dL (ref 1.7–2.4)

## 2016-08-28 LAB — GLUCOSE, CAPILLARY
GLUCOSE-CAPILLARY: 77 mg/dL (ref 65–99)
GLUCOSE-CAPILLARY: 76 mg/dL (ref 65–99)
Glucose-Capillary: 70 mg/dL (ref 65–99)
Glucose-Capillary: 69 mg/dL (ref 65–99)
Glucose-Capillary: 66 mg/dL (ref 65–99)
Glucose-Capillary: 123 mg/dL — ABNORMAL HIGH (ref 65–99)

## 2016-08-28 LAB — PHOSPHORUS: Phosphorus: 3.3 mg/dL (ref 2.5–4.6)

## 2016-08-28 MED ORDER — DOXYCYCLINE HYCLATE 100 MG PO TABS: 100.0000 mg | ORAL_TABLET | Freq: Two times a day (BID) | ORAL | 0 refills | Status: AC

## 2016-08-28 MED ORDER — DEXTROSE 50 % IV SOLN
INTRAVENOUS | Status: AC
  Administered 2016-08-28: 25 mL
  Filled 2016-08-28: qty 50

## 2016-08-28 MED ORDER — DEXTROSE 50 % IV SOLN
INTRAVENOUS | Status: AC
  Filled 2016-08-28: qty 50

## 2016-08-28 MED ORDER — DEXTROSE 50 % IV SOLN
25.0000 mL | Freq: Once | INTRAVENOUS | Status: AC
  Administered 2016-08-28: 25 mL via INTRAVENOUS
  Filled 2016-08-28: qty 50

## 2016-08-28 NOTE — Care Management (Signed)
Patient ready for discharge. [facility] to assist EMS with patient transition. Extra trach supplies and Rx for Doxy together and patient's mother is aware and agrees. [facility] [phone]. No other RNCM needs.

## 2016-08-28 NOTE — Care Management (Signed)
[facility]

## 2016-08-28 NOTE — Progress Notes (Signed)
Patient discharged via non emergent EMS to home address. [facility] arrival to [facility]. Patient transported to home on personal ventilator. Battery charged upon departure from ICU room 14. All belongings with patient. Patient in stable condition upon discharge.

## 2016-08-30 ENCOUNTER — Emergency Department
Admission: EM | Admit: 2016-08-30 | Discharge: 2016-08-31 | Disposition: A | Discharge status: Home / Self Care | Payer: Medicare Other | Attending: Student | Admitting: Student

## 2016-08-30 DIAGNOSIS — Z87891 Personal history of nicotine dependence: Secondary | ICD-10-CM | POA: Insufficient documentation

## 2016-08-30 DIAGNOSIS — R0603 Acute respiratory distress: Secondary | ICD-10-CM

## 2016-08-30 DIAGNOSIS — J9503 Malfunction of tracheostomy stoma: Secondary | ICD-10-CM | POA: Diagnosis not present

## 2016-08-30 DIAGNOSIS — J9509 Other tracheostomy complication: Secondary | ICD-10-CM

## 2016-08-30 DIAGNOSIS — R0600 Dyspnea, unspecified: Secondary | ICD-10-CM | POA: Diagnosis present

## 2016-08-30 NOTE — ED Triage Notes (Signed)
Pt has custom 7.5 tracheostomy that was recently placed. Pt's home health RN was turning her to change her and her vent alarm for minimum leak began. Home RN suctioned her tracheostomy, pt continued to be in respiratory distress. Pt continued to report shortness of breath, RN was instructed to change vent filter. Pt was bagged by home health RN and EMS called. Pt presents here while being bagged to trach. Pt remains in some respiratory distress, bagging continued upon arrival. Pt diaphoretic and pale on arrival, able to respond appropriately to questions requiring yes or no answers. Respiratory encountered resistance when attempting to suction trach w/ minimal success, reported encountering resistance. EDP at bedside on arrival.

## 2016-08-31 ENCOUNTER — Encounter: Payer: Self-pay | Admitting: *Deleted

## 2016-08-31 ENCOUNTER — Emergency Department: Payer: Medicare Other

## 2016-08-31 LAB — CBC WITH DIFFERENTIAL/PLATELET
BASOS ABS: 0 10*3/uL (ref 0–0.1)
BASOS PCT: 0 %
Eosinophils Absolute: 0.3 10*3/uL (ref 0–0.7)
Eosinophils Relative: 2 %
HEMATOCRIT: 38.1 % (ref 35.0–47.0)
HEMOGLOBIN: 12.9 g/dL (ref 12.0–16.0)
LYMPHS PCT: 28 %
Lymphs Abs: 4.7 10*3/uL — ABNORMAL HIGH (ref 1.0–3.6)
MCH: 33.8 pg (ref 26.0–34.0)
MCHC: 33.9 g/dL (ref 32.0–36.0)
MCV: 99.5 fL (ref 80.0–100.0)
MONO ABS: 1.1 10*3/uL — AB (ref 0.2–0.9)
Monocytes Relative: 7 %
NEUTROS ABS: 10.9 10*3/uL — AB (ref 1.4–6.5)
NEUTROS PCT: 63 %
Platelets: 415 10*3/uL (ref 150–440)
RBC: 3.83 MIL/uL (ref 3.80–5.20)
RDW: 12.9 % (ref 11.5–14.5)
WBC: 17 10*3/uL — ABNORMAL HIGH (ref 3.6–11.0)

## 2016-08-31 LAB — BASIC METABOLIC PANEL
ANION GAP: 4 — AB (ref 5–15)
BUN: 20 mg/dL (ref 6–20)
CHLORIDE: 105 mmol/L (ref 101–111)
CO2: 29 mmol/L (ref 22–32)
Calcium: 9.2 mg/dL (ref 8.9–10.3)
Creatinine, Ser: <0.3 mg/dL — ABNORMAL LOW (ref 0.44–1.00)
GLUCOSE: 123 mg/dL — AB (ref 65–99)
POTASSIUM: 4.3 mmol/L (ref 3.5–5.1)
Sodium: 138 mmol/L (ref 135–145)

## 2016-08-31 MED ORDER — ACETAMINOPHEN 160 MG/5ML PO SOLN
650.0000 mg | Freq: Once | ORAL | Status: AC
  Administered 2016-08-31: 650 mg

## 2016-08-31 MED ORDER — ACETAMINOPHEN 325 MG PO TABS
ORAL_TABLET | ORAL | Status: AC
  Administered 2016-08-31: 650 mg
  Filled 2016-08-31: qty 2

## 2016-08-31 NOTE — ED Notes (Signed)
See RTT charting for vent settings and procedural activities.

## 2016-08-31 NOTE — ED Provider Notes (Signed)
[facility] Emergency Department Provider Note   ____________________________________________   First MD Initiated Contact with Patient 08/31/16 0021     (approximate)  I have reviewed the triage vital signs and the nursing notes.   HISTORY  Chief Complaint Respiratory Distress  Caveat-history of present illness and review of systems is somewhat limited due to the patient's inability to speak with trach, information obtained partly from the patient as well as her caregivers.  HPI Brenda Anderson is a 45 y.o. female with ALS, chronically ill with indwelling tracheostomy on the home ventilator, PEG tube who presents to severe respiratory distress that began suddenly just prior to arrival, constant, no modifying factors. Her caregiver noted that her that was alarming for elevated peak pressures, she deflated the cuff on the tracheostomy and reinflated however the patient was difficult to bag and experienced severe hypoxia. She was brought to the emergency permit for evaluation. Prior to this she had been in her usual state of health without fever, vomiting, diarrhea or acute illness.   Past Medical History:  Diagnosis Date  . ALS (amyotrophic lateral sclerosis) (HCC)   . PEG (percutaneous endoscopic gastrostomy) status (HCC)   . 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 Acute on chronic respiratory failure with hypercapnia (HCC) 08/20/2016  . ALS (amyotrophic lateral sclerosis) (HCC) 08/16/2016    History reviewed. No pertinent surgical history.  Prior to Admission medications   Medication Sig Start Date End Date Taking? Authorizing Provider  ascorbic acid (VITAMIN C) 250 MG tablet Take 250 mg by mouth 2 (two) times daily.    Historical Provider, MD  atenolol (TENORMIN) 25 MG tablet Place 3 tablets into feeding tube daily. 02/24/16   Historical Provider, MD  buPROPion (WELLBUTRIN) 100 MG tablet Place 1  tablet into feeding tube 3 (three) times daily. 02/24/16   Historical Provider, MD  Cholecalciferol (VITAMIN D3) 1000 units CAPS Place 1 capsule into feeding tube 2 (two) times daily.  02/24/16   Historical Provider, MD  citalopram (CELEXA) 20 MG tablet Take 20 mg by mouth daily.    Historical Provider, MD  clonazePAM (KLONOPIN) 0.5 MG tablet Place 0.25 mg into feeding tube 2 (two) times daily as needed.  02/24/16   Historical Provider, MD  doxycycline (VIBRA-TABS) 100 MG tablet Place 1 tablet (100 mg total) into feeding tube 2 (two) times daily. Give 2 hours before or 4 hours after iron. 08/28/16 09/05/16  Stephanie Acre, MD  Ferrous Fumarate (FERROCITE) 324 (106 Fe) MG TABS tablet Take 1 tablet by mouth 2 (two) times daily.    Historical Provider, MD  loratadine (CLARITIN) 10 MG tablet Place 1 tablet into feeding tube daily. 02/24/16   Historical Provider, MD  Melatonin 1 MG/ML LIQD Place 8 mLs into feeding tube at bedtime. 02/24/16   Historical Provider, MD  polyethylene glycol (MIRALAX / GLYCOLAX) packet Take 17 g by mouth daily as needed.    Historical Provider, MD  pregabalin (LYRICA) 50 MG capsule Take 2 capsules by mouth daily. 02/24/16   Historical Provider, MD  ranitidine (ZANTAC) 15 MG/ML syrup Place 10 mLs into feeding tube daily. 02/24/16   Historical Provider, MD    Allergies Ciprofloxacin; Other; and Ultram [tramadol hcl]  History reviewed. No pertinent family history.  Social History Social History  Substance Use Topics  . Smoking status: Former Smoker    Packs/day: 1.00    Years: 30.00    Types: Cigarettes  Quit date: 12/11/2012  . Smokeless tobacco: Never Used  . Alcohol use No    Review of Systems  Caveat-history of present illness and review of systems is somewhat limited due to the patient's inability to verbalize, information obtained partly from the patient as well as her caregivers.  ____________________________________________   PHYSICAL EXAM:  Vitals:    08/31/16 0145 08/31/16 0148 08/31/16 0200 08/31/16 0215  BP: 98/77 110/81 105/82 117/90  Pulse: 86 81 85 82  Resp: 15 12 14 13   Temp:      TempSrc:      SpO2: 100% 100% 99% 100%   Temp 98.2 F  VITAL SIGNS: ED Triage Vitals  Enc Vitals Group     BP 08/31/16 0000 117/77     Pulse Rate 08/31/16 0000 93     Resp 08/31/16 0000 14     Temp --      Temp src --      SpO2 08/30/16 2348 95 %     Weight --      Height --      Head Circumference --      Peak Flow --      Pain Score --      Pain Loc --      Pain Edu? --      Excl. in GC? --     Constitutional: Awake and alert, in moderate respiratory distress with increased work of breathing. Coughing frequently. Eyes: Conjunctivae are normal. PERRL. EOMI. Head: Atraumatic. Nose: No congestion/rhinnorhea. Mouth/Throat: Mucous membranes are moist.  Oropharynx non-erythematous. Neck: Tracheostomy in the anterior neck without drainage or surrounding erythema. Cardiovascular: Normal rate, regular rhythm. Grossly normal heart sounds.  Good peripheral circulation. Respiratory: Increased work of breathing, difficult to ventilate with BVM. Ventilated breath sounds bilaterally, Gastrointestinal: Soft and nontender. No distention. PEG in the left abdomen. No CVA tenderness. Genitourinary: Deferred Musculoskeletal: No lower extremity tenderness nor edema.  No joint effusions. Neurologic:  Chronic debility with muscle wasting diffusely, consistent with ALS. Skin:  Skin is warm, dry and intact. No rash noted. Psychiatric: Unable to assess.  ____________________________________________   LABS (all labs ordered are listed, but only abnormal results are displayed)  Labs Reviewed  CBC WITH DIFFERENTIAL/PLATELET - Abnormal; Notable for the following:       Result Value   WBC 17.0 (*)    Neutro Abs 10.9 (*)    Lymphs Abs 4.7 (*)    Monocytes Absolute 1.1 (*)    All other components within normal limits  BASIC METABOLIC PANEL - Abnormal;  Notable for the following:    Glucose, Bld 123 (*)    Creatinine, Ser <0.30 (*)    Anion gap 4 (*)    All other components within normal limits   ____________________________________________  EKG  ED ECG REPORT I, Gayla DossGayle, Latrell Reitan A, the attending physician, personally viewed and interpreted this ECG.   Date: 08/31/2016  EKG Time: 23:56  Rate: 93  Rhythm: normal sinus rhythm  Axis: normal  Intervals:none  ST&T Change: No acute ST elevation or acute ST depression.  ____________________________________________  RADIOLOGY  CXR IMPRESSION:  Tracheostomy tube appears in place. Shallow inspiration with  atelectasis in the lung bases.    ____________________________________________   PROCEDURES  Procedure(s) performed: None  Procedures  Critical Care performed: No  ____________________________________________   INITIAL IMPRESSION / ASSESSMENT AND PLAN / ED COURSE  Pertinent labs & imaging results that were available during my care of the patient were reviewed by me and  considered in my medical decision making (see chart for details).  Brenda Anderson is a 45 y.o. female with ALS, chronically ill with indwelling tracheostomy on the home ventilator, PEG tube who presents to severe respiratory distress that began suddenly just prior to arrival in the setting of elevated PT pressures on the ventilator. On exam, she is in moderate respiratory distress, difficult to ventilate through the current tracheostomy. Despite aggressive suctioning, still difficult to ventilate. Decision was made to change out her tube, her custom tracheostomy was replaced with a 6-0 cuffed Shiley tube. On removal of trach tube, a large yellow occlusive mucous plug was noted on the distal end  and that is thought to be the reason for her respiratory distress. As soon as her tracheostomy was replaced, the patient was very easy to bag, had normal O2 saturation and resolution of her respiratory distress. We'll  obtain screening labs, chest x-ray, observed briefly and likely discharge home.  ----------------------------------------- 2:32 AM on 08/31/2016 ----------------------------------------- Patient is resting comfortably, she is no longer short of breath, she is smiling, pleasant. She is comfortable and maintaining adequate O2 saturation on her home ventilator settings. CBC shows leukocytosis which is nonspecific and possibly catecholamine induced in the setting of her respiratory distress this evening. BMP appears generally unremarkable. Chest x-ray with tracheostomy in good position, no acute cardio pulmonary process. We'll DC with return precautions and close PCP and ENT follow-up. She and her caregivers at bedside are comfortable with the discharge plan. DC home.  Clinical Course     ____________________________________________   FINAL CLINICAL IMPRESSION(S) / ED DIAGNOSES  Final diagnoses:  Respiratory distress  Tracheostomy obstruction (HCC)  Tracheostomy obstruction secondary to mucous plugging    NEW MEDICATIONS STARTED DURING THIS VISIT:  New Prescriptions   No medications on file     Note:  This document was prepared using Dragon voice recognition software and may include unintentional dictation errors.    Gayla Doss, MD 08/31/16 [phone]

## 2016-08-31 NOTE — Progress Notes (Signed)
   08/31/16 0200  Clinical Encounter Type  Visited With Patient;Family;Patient and family together;Health care provider  Visit Type Initial;Psychological support;Spiritual support;Social support  Referral From Care management  Consult/Referral To Chaplain  Spiritual Encounters  Spiritual Needs Emotional  Stress Factors  Patient Stress Factors Major life changes  Family Stress Factors Lack of knowledge  Advance Directives (For Healthcare)  Does patient have an advance directive? Yes  Type of Advance Directive Living will  Does patient want to make changes to advanced directive? No - Patient declined   Chaplain responded to a page to provide support for a Pt who had trouble breathing. Family was at bedside and were very calm. Chaplain provided emotional and spiritual care via prayer.

## 2020-08-01 ENCOUNTER — Other Ambulatory Visit: Payer: Self-pay

## 2020-08-01 ENCOUNTER — Emergency Department: Payer: Medicare HMO

## 2020-08-01 ENCOUNTER — Inpatient Hospital Stay
Admission: EM | Admit: 2020-08-01 | Discharge: 2020-08-13 | DRG: 870 | Disposition: A | Discharge status: Home Health | Payer: Medicare HMO | Attending: Pulmonary Disease | Admitting: Pulmonary Disease

## 2020-08-01 ENCOUNTER — Encounter: Payer: Self-pay | Admitting: Emergency Medicine

## 2020-08-01 DIAGNOSIS — R6521 Severe sepsis with septic shock: Secondary | ICD-10-CM | POA: Diagnosis present

## 2020-08-01 DIAGNOSIS — J90 Pleural effusion, not elsewhere classified: Secondary | ICD-10-CM | POA: Diagnosis not present

## 2020-08-01 DIAGNOSIS — Z515 Encounter for palliative care: Secondary | ICD-10-CM

## 2020-08-01 DIAGNOSIS — Z930 Tracheostomy status: Secondary | ICD-10-CM | POA: Diagnosis not present

## 2020-08-01 DIAGNOSIS — R5381 Other malaise: Secondary | ICD-10-CM | POA: Diagnosis present

## 2020-08-01 DIAGNOSIS — A419 Sepsis, unspecified organism: Secondary | ICD-10-CM | POA: Diagnosis present

## 2020-08-01 DIAGNOSIS — K5792 Diverticulitis of intestine, part unspecified, without perforation or abscess without bleeding: Secondary | ICD-10-CM | POA: Diagnosis present

## 2020-08-01 DIAGNOSIS — E870 Hyperosmolality and hypernatremia: Secondary | ICD-10-CM | POA: Diagnosis not present

## 2020-08-01 DIAGNOSIS — Z7189 Other specified counseling: Secondary | ICD-10-CM

## 2020-08-01 DIAGNOSIS — E162 Hypoglycemia, unspecified: Secondary | ICD-10-CM | POA: Diagnosis not present

## 2020-08-01 DIAGNOSIS — R6520 Severe sepsis without septic shock: Secondary | ICD-10-CM | POA: Diagnosis not present

## 2020-08-01 DIAGNOSIS — G8250 Quadriplegia, unspecified: Secondary | ICD-10-CM | POA: Diagnosis present

## 2020-08-01 DIAGNOSIS — Z6825 Body mass index (BMI) 25.0-25.9, adult: Secondary | ICD-10-CM

## 2020-08-01 DIAGNOSIS — G1221 Amyotrophic lateral sclerosis: Secondary | ICD-10-CM | POA: Diagnosis present

## 2020-08-01 DIAGNOSIS — Z87891 Personal history of nicotine dependence: Secondary | ICD-10-CM

## 2020-08-01 DIAGNOSIS — N200 Calculus of kidney: Secondary | ICD-10-CM | POA: Diagnosis present

## 2020-08-01 DIAGNOSIS — Z9911 Dependence on respirator [ventilator] status: Secondary | ICD-10-CM

## 2020-08-01 DIAGNOSIS — Z20822 Contact with and (suspected) exposure to covid-19: Secondary | ICD-10-CM | POA: Diagnosis present

## 2020-08-01 DIAGNOSIS — M6284 Sarcopenia: Secondary | ICD-10-CM | POA: Diagnosis present

## 2020-08-01 DIAGNOSIS — J9610 Chronic respiratory failure, unspecified whether with hypoxia or hypercapnia: Secondary | ICD-10-CM | POA: Diagnosis present

## 2020-08-01 DIAGNOSIS — D72829 Elevated white blood cell count, unspecified: Secondary | ICD-10-CM

## 2020-08-01 DIAGNOSIS — Z66 Do not resuscitate: Secondary | ICD-10-CM | POA: Diagnosis not present

## 2020-08-01 DIAGNOSIS — Z931 Gastrostomy status: Secondary | ICD-10-CM | POA: Diagnosis not present

## 2020-08-01 DIAGNOSIS — J151 Pneumonia due to Pseudomonas: Secondary | ICD-10-CM | POA: Diagnosis not present

## 2020-08-01 DIAGNOSIS — E43 Unspecified severe protein-calorie malnutrition: Secondary | ICD-10-CM | POA: Diagnosis present

## 2020-08-01 DIAGNOSIS — K5641 Fecal impaction: Secondary | ICD-10-CM | POA: Diagnosis present

## 2020-08-01 DIAGNOSIS — E876 Hypokalemia: Secondary | ICD-10-CM | POA: Diagnosis not present

## 2020-08-01 DIAGNOSIS — Z1624 Resistance to multiple antibiotics: Secondary | ICD-10-CM | POA: Diagnosis not present

## 2020-08-01 DIAGNOSIS — K5732 Diverticulitis of large intestine without perforation or abscess without bleeding: Secondary | ICD-10-CM

## 2020-08-01 LAB — CBC
HCT: 44.7 % (ref 36.0–46.0)
Hemoglobin: 14.6 g/dL (ref 12.0–15.0)
MCH: 31.4 pg (ref 26.0–34.0)
MCHC: 32.7 g/dL (ref 30.0–36.0)
MCV: 96.1 fL (ref 80.0–100.0)
Platelets: 298 10*3/uL (ref 150–400)
RBC: 4.65 MIL/uL (ref 3.87–5.11)
RDW: 14.1 % (ref 11.5–15.5)
WBC: 21.6 10*3/uL — ABNORMAL HIGH (ref 4.0–10.5)
nRBC: 0 % (ref 0.0–0.2)

## 2020-08-01 LAB — LIPASE, BLOOD: Lipase: 26 U/L (ref 11–51)

## 2020-08-01 LAB — COMPREHENSIVE METABOLIC PANEL
ALT: 17 U/L (ref 0–44)
AST: 22 U/L (ref 15–41)
Albumin: 4 g/dL (ref 3.5–5.0)
Alkaline Phosphatase: 95 U/L (ref 38–126)
Anion gap: 12 (ref 5–15)
BUN: 20 mg/dL (ref 6–20)
CO2: 25 mmol/L (ref 22–32)
Calcium: 9.6 mg/dL (ref 8.9–10.3)
Chloride: 102 mmol/L (ref 98–111)
Creatinine, Ser: <0.3 mg/dL — ABNORMAL LOW (ref 0.44–1.00)
Glucose, Bld: 118 mg/dL — ABNORMAL HIGH (ref 70–99)
Potassium: 4 mmol/L (ref 3.5–5.1)
Sodium: 139 mmol/L (ref 135–145)
Total Bilirubin: 0.7 mg/dL (ref 0.3–1.2)
Total Protein: 8 g/dL (ref 6.5–8.1)

## 2020-08-01 LAB — LACTIC ACID, PLASMA
Lactic Acid, Venous: 1.6 mmol/L (ref 0.5–1.9)
Lactic Acid, Venous: 1.5 mmol/L (ref 0.5–1.9)

## 2020-08-01 LAB — GLUCOSE, CAPILLARY: Glucose-Capillary: 117 mg/dL — ABNORMAL HIGH (ref 70–99)

## 2020-08-01 LAB — SARS CORONAVIRUS 2 BY RT PCR (HOSPITAL ORDER, PERFORMED IN ~~LOC~~ HOSPITAL LAB): SARS Coronavirus 2: NEGATIVE

## 2020-08-01 MED ORDER — CHLORHEXIDINE GLUCONATE CLOTH 2 % EX PADS
6.0000 | MEDICATED_PAD | Freq: Every day | CUTANEOUS | Status: DC
  Administered 2020-08-01 – 2020-08-03 (×3): 6 via TOPICAL

## 2020-08-01 MED ORDER — MORPHINE SULFATE (PF) 2 MG/ML IV SOLN
2.0000 mg | Freq: Once | INTRAVENOUS | Status: DC
  Filled 2020-08-01 (×2): qty 1

## 2020-08-01 MED ORDER — PIPERACILLIN-TAZOBACTAM 3.375 G IVPB 30 MIN
3.3750 g | Freq: Once | INTRAVENOUS | Status: AC
  Administered 2020-08-01: 3.375 g via INTRAVENOUS
  Filled 2020-08-01: qty 50

## 2020-08-01 MED ORDER — PIPERACILLIN-TAZOBACTAM 3.375 G IVPB
3.3750 g | Freq: Three times a day (TID) | INTRAVENOUS | Status: DC
  Administered 2020-08-01 – 2020-08-10 (×26): 3.375 g via INTRAVENOUS
  Filled 2020-08-01 (×26): qty 50

## 2020-08-01 MED ORDER — MORPHINE SULFATE (PF) 4 MG/ML IV SOLN
4.0000 mg | Freq: Once | INTRAVENOUS | Status: DC
  Administered 2020-08-01: 2 mg via INTRAVENOUS
  Filled 2020-08-01: qty 1

## 2020-08-01 MED ORDER — HYDROCODONE-ACETAMINOPHEN 7.5-325 MG/15ML PO SOLN
10.0000 mL | ORAL | Status: DC | PRN
  Administered 2020-08-01 – 2020-08-13 (×2): 10 mL via ORAL
  Filled 2020-08-01 (×2): qty 15

## 2020-08-01 MED ORDER — ONDANSETRON HCL 4 MG/2ML IJ SOLN
4.0000 mg | Freq: Once | INTRAMUSCULAR | Status: AC
  Administered 2020-08-01: 4 mg via INTRAVENOUS
  Filled 2020-08-01: qty 2

## 2020-08-01 MED ORDER — IOHEXOL 300 MG/ML  SOLN
100.0000 mL | Freq: Once | INTRAMUSCULAR | Status: AC | PRN
  Administered 2020-08-01: 100 mL via INTRAVENOUS

## 2020-08-01 MED ORDER — CHLORHEXIDINE GLUCONATE 0.12% ORAL RINSE (MEDLINE KIT)
15.0000 mL | Freq: Two times a day (BID) | OROMUCOSAL | Status: DC
  Administered 2020-08-01 – 2020-08-13 (×24): 15 mL via OROMUCOSAL

## 2020-08-01 MED ORDER — ORAL CARE MOUTH RINSE
15.0000 mL | OROMUCOSAL | Status: DC
  Administered 2020-08-01 – 2020-08-13 (×97): 15 mL via OROMUCOSAL

## 2020-08-01 MED ORDER — SODIUM CHLORIDE 0.9 % IV BOLUS
1000.0000 mL | Freq: Once | INTRAVENOUS | Status: AC
  Administered 2020-08-01: 1000 mL via INTRAVENOUS

## 2020-08-01 NOTE — Progress Notes (Signed)
CBG upon arrival to ICU is 177 mg/dL.

## 2020-08-01 NOTE — ED Notes (Signed)
Called report to ICU

## 2020-08-01 NOTE — Consult Note (Signed)
Urology Consult  Requesting physician: Dr. Mortimer Fries  Reason for consultation: Foley catheter placement  Chief Complaint: N/A  History of Present Illness: Brenda Anderson is a 49 y.o. female with ALS and tracheostomy presented to the ED with abdominal distention and pain.  Admitted to ICU with sepsis felt secondary to acute diverticulitis.  Several attempts of Foley catheter placement by nursing staff unsuccessful.  She has an indwelling catheter in place but no urine return.  CT scan did show a nonobstructing right upper pole renal calculus.  No hydronephrosis or ureteral calculi  Past Medical History:  Diagnosis Date   ALS (amyotrophic lateral sclerosis) (HCC)    PEG (percutaneous endoscopic gastrostomy) status ([place])    Tracheostomy dependence ([place])     History reviewed. No pertinent surgical history.  Home Medications:  No outpatient medications have been marked as taking for the 08/01/20 encounter [facility] Encounter).    Allergies:  Allergies  Allergen Reactions   Ciprofloxacin Nausea Only and Nausea And Vomiting   Other     purfume   Ultram [Tramadol Hcl]     No family history on file.  Social History:  reports that she quit smoking about 7 years ago. Her smoking use included cigarettes. She has a 30.00 pack-year smoking history. She has never used smokeless tobacco. She reports that she does not drink alcohol and does not use drugs.  ROS: A complete review of systems was performed.  All systems are negative except for pertinent findings as noted.  Physical Exam:  Vital signs in last 24 hours: Temp:  [97.8 F (36.6 C)-98.5 F (36.9 C)] 98.5 F (36.9 C) (08/22 1722) Pulse Rate:  [107-126] 107 (08/22 1800) Resp:  [12-24] 24 (08/22 1800) BP: (109-113)/(67-75) 109/67 (08/22 1800) SpO2:  [88 %-97 %] 96 % (08/22 1800) FiO2 (%):  [28 %] 28 % (08/22 1730) Weight:  [71.1 kg] 71.1 kg (08/22 1722) Constitutional:  Alert, No acute distress GI: Abdomen  distended GU: External genitalia normal in appearance.  Urethral meatus was difficult to visualize.  Catheter placement: External genitalia were prepped and draped.  I did not remove the previously placed Foley catheter and a 14 [place] Coloplast coud catheter was advanced superior to the  indwelling catheter, no resistance was met and clear yellow urine was obtained.  The balloon was inflated with 10 cc of sterile water and placed to gravity drainage.  The balloon on the previous place Foley was deflated and it was removed.  Laboratory Data:  Recent Labs    08/01/20 1346  WBC 21.6*  HGB 14.6  HCT 44.7   Recent Labs    08/01/20 1346  NA 139  K 4.0  CL 102  CO2 25  GLUCOSE 118*  BUN 20  CREATININE <0.30*  CALCIUM 9.6   No results for input(s): LABPT, INR in the last 72 hours. No results for input(s): LABURIN in the last 72 hours. Results for orders placed or performed during the hospital encounter of 08/01/20  SARS Coronavirus 2 by RT PCR (hospital order, performed in [facility] hospital lab) Nasopharyngeal Nasopharyngeal Swab     Status: None   Collection Time: 08/01/20  3:47 PM   Specimen: Nasopharyngeal Swab  Result Value Ref Range Status   SARS Coronavirus 2 NEGATIVE NEGATIVE Final    Comment: (NOTE) SARS-CoV-2 target nucleic acids are NOT DETECTED.  The SARS-CoV-2 RNA is generally detectable in upper and lower respiratory specimens during the acute phase of infection. The lowest concentration of  SARS-CoV-2 viral copies this assay can detect is 250 copies / mL. A negative result does not preclude SARS-CoV-2 infection and should not be used as the sole basis for treatment or other patient management decisions.  A negative result may occur with improper specimen collection / handling, submission of specimen other than nasopharyngeal swab, presence of viral mutation(s) within the areas targeted by this assay, and inadequate number of viral copies (<250 copies / mL). A  negative result must be combined with clinical observations, patient history, and epidemiological information.  Fact Sheet for Patients:   StrictlyIdeas.no  Fact Sheet for Healthcare Providers: BankingDealers.co.za  This test is not yet approved or  cleared by the [place] FDA and has been authorized for detection and/or diagnosis of SARS-CoV-2 by FDA under an Emergency Use Authorization (EUA).  This EUA will remain in effect (meaning this test can be used) for the duration of the COVID-19 declaration under Section 564(b)(1) of the Act, 21 U.S.C. section 360bbb-3(b)(1), unless the authorization is terminated or revoked sooner.  Performed at [facility], [address] [place], [place] [address]      Radiologic Imaging: CT ABDOMEN PELVIS W CONTRAST  Result Date: 08/01/2020 CLINICAL DATA:  Abdominal distension, lower abdominal pain EXAM: CT ABDOMEN AND PELVIS WITH CONTRAST TECHNIQUE: Multidetector CT imaging of the abdomen and pelvis was performed using the standard protocol following bolus administration of intravenous contrast. CONTRAST:  125m OMNIPAQUE IOHEXOL 300 MG/ML  SOLN COMPARISON:  May 01, 2013 FINDINGS: Lower chest: Basilar atelectasis. No consolidation. No pleural effusion. Hepatobiliary: Liver without focal, suspicious lesion. Portal vein is patent. No pericholecystic stranding. No biliary duct dilation. Pancreas: Pancreas is normal without ductal dilation or inflammation. Spleen: Spleen normal in size and contour. Adrenals/Urinary Tract: Adrenal glands are normal. No signs of hydronephrosis. Nephrolithiasis. Large calculus in the RIGHT upper pole measuring approximately 1.6 x 1.0 cm. Smaller calculi in the RIGHT renal pelvis and a smaller interpolar calculus in an interpolar calyx measuring approximately 7 mm. Normal appearance of the LEFT kidney Stomach/Bowel: Signs of acute diverticulitis along the descending  sigmoid junction. Rectal distension up to 8 cm.  Pancolonic diverticulosis. Gastric tube in situ similar to the prior study. No acute small bowel process. Vascular/Lymphatic: Scattered atheromatous plaque of the abdominal aorta tracking in the iliac vessels. No adenopathy in the retroperitoneum. No adenopathy in the pelvis. Reproductive: No adnexal mass. Other: No free air.  No abscess. Musculoskeletal: Thinning of the sacrum and marked osteopenia with loss of muscular density, replacement with fatty atrophy. Bilateral breast implants. No acute musculoskeletal process. IMPRESSION: 1. Signs of acute diverticulitis along the descending sigmoid junction in the setting of pancolonic diverticulosis. No abscess or free air. 2. Fecal impaction 3. RIGHT-sided nephrolithiasis with large calculi as described. 4. Muscular atrophy and osteopenia likely related to primary diagnosis of ALS. Thinning of the sacrococcygeal region with respect of bone may relate to prior pressure ulceration. There is no stranding in the fat to suggest decubitus changes at this time in this location. Mild stranding is noted overlying the RIGHT ischium. 5. Aortic atherosclerosis. Aortic Atherosclerosis (ICD10-I70.0). Electronically Signed   By: GZetta BillsM.D.   On: 08/01/2020 15:21    Impression/Assessment:   Foley catheter placed without difficulty  Nonobstructing right renal calculus  Recommendation:   Discontinue Foley when no longer needed for output monitoring   08/01/2020, 8:52 PM  SJohn Giovanni  MD

## 2020-08-01 NOTE — H&P (Signed)
Name: Brenda Anderson MRN: [id number] DOB: 15-Nov-1971     CONSULTATION DATE: 08/01/2020  REFERRING MD : Scotty Court  CHIEF COMPLAINT: Abd pain  HISTORY OF PRESENT ILLNESS: 49 y.o. female with a history of ALS, PEG tube, tracheostomy brought in by nurse because of abdominal distention.    Nurse notes that patient has not had large bowel movements recently but has been having small bowel movements.    This morning noted that the patient had bloating and distention in the abdomen and seem to be tender to palpation.    Patient is able to mouth responses to questions.  Nurse mentions that 99.3 axillary temp this morning  ER course CT abd-1. Signs of acute diverticulitis along the descending sigmoid junction in the setting of pancolonic diverticulosis. No abscess or free air. 2. Fecal impaction 3. RIGHT-sided nephrolithiasis with large calculi as described. 4. Muscular atrophy and osteopenia likely related to primary diagnosis of ALS. Thinning of the sacrococcygeal region with respect of bone may relate to prior pressure ulceration. There is no stranding in the fat to suggest decubitus changes at this time in this location. Mild stranding is noted overlying the RIGHT ischium.  Patient started on IV abx  PCCM called to admit patient with acute Diverticulitis and sepsis  Chronic Trach and Vent dependant, Dx of ALS   PAST MEDICAL HISTORY :   has a past medical history of ALS (amyotrophic lateral sclerosis) (HCC), PEG (percutaneous endoscopic gastrostomy) status (HCC), and Tracheostomy dependence (HCC).  has no past surgical history on file. Prior to Admission medications   Medication Sig Start Date End Date Taking? Authorizing Provider  ascorbic acid (VITAMIN C) 250 MG tablet Take 250 mg by mouth 2 (two) times daily.    [provider]  atenolol (TENORMIN) 25 MG tablet Place 3 tablets into feeding tube daily. 02/24/16   [provider]  buPROPion (WELLBUTRIN) 100  MG tablet Place 1 tablet into feeding tube 3 (three) times daily. 02/24/16   [provider]  Cholecalciferol (VITAMIN D3) 1000 units CAPS Place 1 capsule into feeding tube 2 (two) times daily.  02/24/16   [provider]  citalopram (CELEXA) 20 MG tablet Take 20 mg by mouth daily.    [provider]  clonazePAM (KLONOPIN) 0.5 MG tablet Place 0.25 mg into feeding tube 2 (two) times daily as needed.  02/24/16   [provider]  Ferrous Fumarate (FERROCITE) 324 (106 Fe) MG TABS tablet Take 1 tablet by mouth 2 (two) times daily.    [provider]  loratadine (CLARITIN) 10 MG tablet Place 1 tablet into feeding tube daily. 02/24/16   [provider]  Melatonin 1 MG/ML LIQD Place 8 mLs into feeding tube at bedtime. 02/24/16   [provider]  polyethylene glycol (MIRALAX / GLYCOLAX) packet Take 17 g by mouth daily as needed.    [provider]  pregabalin (LYRICA) 50 MG capsule Take 2 capsules by mouth daily. 02/24/16   [provider]  ranitidine (ZANTAC) 15 MG/ML syrup Place 10 mLs into feeding tube daily. 02/24/16   [provider]   Allergies  Allergen Reactions  . Ciprofloxacin Nausea Only and Nausea And Vomiting  . Other     purfume  . Ultram [Tramadol Hcl]     FAMILY HISTORY:  family history is not on file. SOCIAL HISTORY:  reports that she quit smoking about 7 years ago. Her smoking use included cigarettes. She has a 30.00 pack-year smoking history. She has  never used smokeless tobacco. She reports that she does not drink alcohol and does not use drugs.   REVIEW OF SYSTEMS  PATIENT IS UNABLE TO PROVIDE COMPLETE REVIEW OF SYSTEM S DUE TO ENCEPHALOPATHY     VITAL SIGNS: Temp:  [97.8 F (36.6 C)] 97.8 F (36.6 C) (08/22 1228) Pulse Rate:  [126] 126 (08/22 1220) Resp:  [12] 12 (08/22 1348) BP: (109)/(75) 109/75 (08/22 1220) SpO2:  [88 %-97 %] 97 % (08/22 1600) FiO2 (%):  [28 %] 28 % (08/22  1600)     SpO2: 97 % O2 Flow Rate (L/min): 2 L/min FiO2 (%): 28 %    Physical Examination:   GENERAL:NAD, chronic Vent, s/p trach HEAD: Normocephalic, atraumatic.  EYES: PERLA, EOMI No scleral icterus.  MOUTH: Moist mucosal membrane.  EAR, NOSE, THROAT: Clear without exudates. No external lesions.  NECK: Supple.  PULMONARY: CTA B/L no wheezing, rhonchi, crackles CARDIOVASCULAR: S1 and S2. Regular rate and rhythm. No murmurs GASTROINTESTINAL: Soft, nontender, nondistended. Positive bowel sounds.  MUSCULOSKELETAL: No swelling, clubbing, or edema.  NEUROLOGIC: No gross focal neurological deficits. 5/5 strength all extremities SKIN: No ulceration, lesions, rashes, or cyanosis.  PSYCHIATRIC: Insight, judgment intact. -depression -anxiety ALL OTHER ROS ARE NEGATIVE   MEDICATIONS: I have reviewed all medications and confirmed regimen as documented      IMAGING    CT ABDOMEN PELVIS W CONTRAST  Result Date: 08/01/2020 CLINICAL DATA:  Abdominal distension, lower abdominal pain EXAM: CT ABDOMEN AND PELVIS WITH CONTRAST TECHNIQUE: Multidetector CT imaging of the abdomen and pelvis was performed using the standard protocol following bolus administration of intravenous contrast. CONTRAST:  OMNIPAQUE IOHEXOL 300 MG/ML  SOLN COMPARISON:  May 01, 2013 FINDINGS: Lower chest: Basilar atelectasis. No consolidation. No pleural effusion. Hepatobiliary: Liver without focal, suspicious lesion. Portal vein is patent. No pericholecystic stranding. No biliary duct dilation. Pancreas: Pancreas is normal without ductal dilation or inflammation. Spleen: Spleen normal in size and contour. Adrenals/Urinary Tract: Adrenal glands are normal. No signs of hydronephrosis. Nephrolithiasis. Large calculus in the RIGHT upper pole measuring approximately 1.6 x 1.0 cm. Smaller calculi in the RIGHT renal pelvis and a smaller interpolar calculus in an interpolar calyx measuring approximately 7 mm. Normal appearance  of the LEFT kidney Stomach/Bowel: Signs of acute diverticulitis along the descending sigmoid junction. Rectal distension up to 8 cm.  Pancolonic diverticulosis. Gastric tube in situ similar to the prior study. No acute small bowel process. Vascular/Lymphatic: Scattered atheromatous plaque of the abdominal aorta tracking in the iliac vessels. No adenopathy in the retroperitoneum. No adenopathy in the pelvis. Reproductive: No adnexal mass. Other: No free air.  No abscess. Musculoskeletal: Thinning of the sacrum and marked osteopenia with loss of muscular density, replacement with fatty atrophy. Bilateral breast implants. No acute musculoskeletal process. IMPRESSION: 1. Signs of acute diverticulitis along the descending sigmoid junction in the setting of pancolonic diverticulosis. No abscess or free air. 2. Fecal impaction 3. RIGHT-sided nephrolithiasis with large calculi as described. 4. Muscular atrophy and osteopenia likely related to primary diagnosis of ALS. Thinning of the sacrococcygeal region with respect of bone may relate to prior pressure ulceration. There is no stranding in the fat to suggest decubitus changes at this time in this location. Mild stranding is noted overlying the RIGHT ischium. 5. Aortic atherosclerosis. Aortic Atherosclerosis (ICD10-I70.0). Electronically Signed   By: Donzetta Kohut M.D.   On: 08/01/2020 15:21       ASSESSMENT AND PLAN SYNOPSIS  Acute SEPSIS due to acute DIVERTICULITIS SEPSIS PROTOCOL  IV ABX IVF'S GEN SURGERY CONSULTATION KEEP NPO PER PEG  CHRONIC RESP FAILURE VENT SUPPORT OXYGEN AS NEEDED  ELECTROLYTES -follow labs as needed -replace as needed -pharmacy consultation and following    DVT/GI PRX ordered and assessed TRANSFUSIONS AS NEEDED MONITOR FSBS I Assessed the need for Labs I Assessed the need for Foley I Assessed the need for Central Venous Line Family Discussion when available I Assessed the need for Mobilization I made an Assessment  of medications to be adjusted accordingly Safety Risk assessment completed  CASE DISCUSSED IN MULTIDISCIPLINARY ROUNDS WITH ICU TEAM     Rahul Malinak [name], M.D.  [facility]monary [facility]  Medical Director [facility] Medical Director [facility] Cardio-Pulmonary Department

## 2020-08-01 NOTE — ED Provider Notes (Addendum)
[facility] Emergency Department Provider Note   ____________________________________________    I have reviewed the triage vital signs and the nursing notes.   HISTORY  Chief Complaint Abdominal Pain  History limited as patient is nonverbal, nurses providing most history   HPI Brenda Anderson is a 49 y.o. female with a history of ALS, PEG tube, tracheostomy brought in by nurse because of abdominal distention.  Nurse notes that patient has not had large bowel movements recently but has been having small bowel movements.  This morning noted that the patient had bloating and distention in the abdomen and seem to be tender to palpation.  Patient is able to mouth responses to questions.  Nurse mentions that 99.3 axillary temp this morning  Past Medical History:  Diagnosis Date  . ALS (amyotrophic lateral sclerosis) (HCC)   . PEG (percutaneous endoscopic gastrostomy) status (HCC)   . Tracheostomy dependence [facility])     Patient Active Problem List   Diagnosis Date Noted  . Acute on chronic respiratory failure with hypercapnia (HCC) 08/20/2016  . ALS (amyotrophic lateral sclerosis) (HCC) 08/16/2016    History reviewed. No pertinent surgical history.  Prior to Admission medications   Medication Sig Start Date End Date Taking? Authorizing Provider  ascorbic acid (VITAMIN C) 250 MG tablet Take 250 mg by mouth 2 (two) times daily.    [provider]  atenolol (TENORMIN) 25 MG tablet Place 3 tablets into feeding tube daily. 02/24/16   [provider]  buPROPion (WELLBUTRIN) 100 MG tablet Place 1 tablet into feeding tube 3 (three) times daily. 02/24/16   [provider]  Cholecalciferol (VITAMIN D3) 1000 units CAPS Place 1 capsule into feeding tube 2 (two) times daily.  02/24/16   [provider]  citalopram (CELEXA) 20 MG tablet Take 20 mg by mouth daily.    [provider]  clonazePAM (KLONOPIN) 0.5 MG tablet Place  0.25 mg into feeding tube 2 (two) times daily as needed.  02/24/16   [provider]  Ferrous Fumarate (FERROCITE) 324 (106 Fe) MG TABS tablet Take 1 tablet by mouth 2 (two) times daily.    [provider]  loratadine (CLARITIN) 10 MG tablet Place 1 tablet into feeding tube daily. 02/24/16   [provider]  Melatonin 1 MG/ML LIQD Place 8 mLs into feeding tube at bedtime. 02/24/16   [provider]  polyethylene glycol (MIRALAX / GLYCOLAX) packet Take 17 g by mouth daily as needed.    [provider]  pregabalin (LYRICA) 50 MG capsule Take 2 capsules by mouth daily. 02/24/16   [provider]  ranitidine (ZANTAC) 15 MG/ML syrup Place 10 mLs into feeding tube daily. 02/24/16   [provider]     Allergies Ciprofloxacin, Other, and Ultram [tramadol hcl]  No family history on file.  Social History Social History   Tobacco Use  . Smoking status: Former Smoker    Packs/day: 1.00    Years: 30.00    Pack years: 30.00    Types: Cigarettes    Quit date: 12/11/2012    Years since quitting: 7.6  . Smokeless tobacco: Never Used  Substance Use Topics  . Alcohol use: No  . Drug use: No    Review of Systems limited as noted above  Constitutional: No reports of fever   Respiratory: No increased shortness of breath Gastrointestinal: Abdominal distention Genitourinary: No foul-smelling urine  Skin: Negative for rash.    ____________________________________________   PHYSICAL EXAM:  VITAL SIGNS: ED Triage Vitals  Enc Vitals Group     BP 08/01/20 1220 109/75     Pulse Rate 08/01/20 1220 (!) 126     Resp --      Temp 08/01/20 1228 97.8 F (36.6 C)     Temp Source 08/01/20 1228 Axillary     SpO2 08/01/20 1220 (!) 88 %     Weight --      Height --      Head Circumference --      Peak Flow --      Pain Score --      Pain Loc --      Pain Edu? --      Excl. in GC? --     Constitutional: Alert   Nose: No  congestion/rhinnorhea. Mouth/Throat: Mucous membranes are moist.  Trach noted  Cardiovascular: Normal rate, regular rhythm.  Good peripheral circulation. Respiratory: Normal respiratory effort.  No retractions.  Gastrointestinal: Positive distention, soft, tender primarily along the right.  No CVA tenderness.  PEG tube noted  Musculoskeletal: No lower extremity tenderness nor edema.  Warm and well perfused Neurologic:   No new neurological deficits noted Skin:  Skin is warm, dry and intact. No rash noted. Psychiatric: Mood and affect are normal. Speech and behavior are normal.  ____________________________________________   LABS (all labs ordered are listed, but only abnormal results are displayed)  Labs Reviewed  COMPREHENSIVE METABOLIC PANEL - Abnormal; Notable for the following components:      Result Value   Glucose, Bld 118 (*)    Creatinine, Ser <0.30 (*)    All other components within normal limits  CBC - Abnormal; Notable for the following components:   WBC 21.6 (*)    All other components within normal limits  LIPASE, BLOOD  URINALYSIS, COMPLETE (UACMP) WITH MICROSCOPIC   ____________________________________________  EKG  None ____________________________________________  RADIOLOGY  CT abdomen pelvis ____________________________________________   PROCEDURES  Procedure(s) performed: No  Ultrasound ED Peripheral IV (Provider)  Date/Time: 08/01/2020 2:05 PM Performed by: Jene Every, MD Authorized by: Jene Every, MD   Procedure details:    Indications: multiple failed IV attempts and poor IV access     Skin Prep: chlorhexidine gluconate     Location:  Left AC   Angiocath:  18 G   Bedside Ultrasound Guided: Yes     Images: not archived     Patient tolerated procedure without complications: Yes     Dressing applied: Yes       ------------------------------------------------------------------------------------------------------------------- Fecal Disimpaction Procedure Note:  Performed by me:  Patient placed in the lateral recumbent position with knees drawn towards chest. Nurse present for patient support.  Soft brown stool removed, no hard stool ball felt no complications during procedure.   ------------------------------------------------------------------------------------------------------------------     Critical Care performed: No ____________________________________________   INITIAL IMPRESSION / ASSESSMENT AND PLAN / ED COURSE  Pertinent labs & imaging results that were available during my care of the patient were reviewed by me and considered in my medical decision making (see chart for details).  Patient presents with abdominal distention as noted above  Differential includes constipation, small bowel obstruction.  Nurse notes that the patient has had a perforated bowel in the past.  We will send labs, and sent for CT abdomen pelvis  Nurse had difficulty obtaining IV, I did place an ultrasound-guided IV in the left [facility]  Lab work is notable for elevated white blood cell count of 21,000, CMP is normal.  Lipase is normal.  Pending CT abdomen pelvis, I have asked my colleague to follow-up on CT results  Attempted disimpaction based on wet read of CT scan, no hard stool ball palpated, soft brown stool  CT demonstrates diverticulitis as well, elevated white blood cell count, tachycardia.  Will give IV Zosyn and admit to the hospitalist service    ____________________________________________   FINAL CLINICAL IMPRESSION(S) / ED DIAGNOSES  Final diagnoses:  Fecal impaction (HCC)  Diverticulitis        Note:  This document was prepared using Dragon voice recognition software and may include unintentional dictation errors.   Jene Every, MD 08/01/20 1449    Jene Every, MD 08/01/20 2902    Jene Every, MD 08/01/20 [phone]

## 2020-08-01 NOTE — Progress Notes (Signed)
49 yo female w hx ALS w trach and PEG comes w Uncomplicated diverticulitis, CT pers. reviewed. Agree w supportive care, ivf, npo and a/bs. No imminent need for surgical intervention. Full consult to follow

## 2020-08-01 NOTE — ED Notes (Signed)
IV access attempted x 2 by this RN. Dr. Cyril Loosen aware RN unable to get IV access

## 2020-08-01 NOTE — ED Triage Notes (Signed)
Pt to ED via [facility] from home for lower abdominal pain. Pt has hx/o ASL. Pt has trach and is on vent. Pt vent at 5.5 L/min. Home health nurse states that she started c/o abd pain this morning when she came in at 0900, said that the pain started around 0500. Pt has been NPO. Temp was 99.3 at home and pt was c/o being cold. Pt is in NAD.

## 2020-08-02 ENCOUNTER — Inpatient Hospital Stay: Payer: Self-pay

## 2020-08-02 DIAGNOSIS — R6520 Severe sepsis without septic shock: Secondary | ICD-10-CM

## 2020-08-02 LAB — BASIC METABOLIC PANEL
Anion gap: 12 (ref 5–15)
BUN: 18 mg/dL (ref 6–20)
CO2: 24 mmol/L (ref 22–32)
Calcium: 9.3 mg/dL (ref 8.9–10.3)
Chloride: 106 mmol/L (ref 98–111)
Creatinine, Ser: 0.34 mg/dL — ABNORMAL LOW (ref 0.44–1.00)
GFR calc Af Amer: >60 mL/min (ref >60)
GFR calc non Af Amer: >60 mL/min (ref >60)
Glucose, Bld: 83 mg/dL (ref 70–99)
Potassium: 3.7 mmol/L (ref 3.5–5.1)
Sodium: 142 mmol/L (ref 135–145)

## 2020-08-02 LAB — MRSA PCR SCREENING: MRSA by PCR: NEGATIVE

## 2020-08-02 LAB — CBC
HCT: 40.1 % (ref 36.0–46.0)
Hemoglobin: 13.4 g/dL (ref 12.0–15.0)
MCH: 31.5 pg (ref 26.0–34.0)
MCHC: 33.4 g/dL (ref 30.0–36.0)
MCV: 94.1 fL (ref 80.0–100.0)
Platelets: 280 10*3/uL (ref 150–400)
RBC: 4.26 MIL/uL (ref 3.87–5.11)
RDW: 14.6 % (ref 11.5–15.5)
WBC: 17.6 10*3/uL — ABNORMAL HIGH (ref 4.0–10.5)
nRBC: 0 % (ref 0.0–0.2)

## 2020-08-02 MED ORDER — MORPHINE SULFATE (PF) 2 MG/ML IV SOLN
2.0000 mg | Freq: Once | INTRAVENOUS | Status: AC
  Administered 2020-08-02: 2 mg via INTRAVENOUS

## 2020-08-02 MED ORDER — SODIUM CHLORIDE 0.9% FLUSH
10.0000 mL | Freq: Two times a day (BID) | INTRAVENOUS | Status: DC
  Administered 2020-08-02 – 2020-08-12 (×20): 10 mL
  Administered 2020-08-13: 20 mL

## 2020-08-02 MED ORDER — ENOXAPARIN SODIUM 40 MG/0.4ML ~~LOC~~ SOLN
40.0000 mg | SUBCUTANEOUS | Status: DC
  Administered 2020-08-02 – 2020-08-10 (×9): 40 mg via SUBCUTANEOUS
  Filled 2020-08-02 (×9): qty 0.4

## 2020-08-02 MED ORDER — CHLORHEXIDINE GLUCONATE 0.12 % MT SOLN
OROMUCOSAL | Status: AC
  Filled 2020-08-02: qty 15

## 2020-08-02 MED ORDER — SODIUM CHLORIDE 0.9% FLUSH: 10.0000 mL | INTRAVENOUS | Status: DC | PRN

## 2020-08-02 MED ORDER — ONDANSETRON HCL 4 MG/2ML IJ SOLN
4.0000 mg | Freq: Four times a day (QID) | INTRAMUSCULAR | Status: DC | PRN
  Administered 2020-08-02 – 2020-08-13 (×4): 4 mg via INTRAVENOUS
  Filled 2020-08-02 (×4): qty 2

## 2020-08-02 MED ORDER — PROMETHAZINE HCL 25 MG/ML IJ SOLN
12.5000 mg | INTRAMUSCULAR | Status: DC | PRN
  Administered 2020-08-02 – 2020-08-13 (×7): 12.5 mg via INTRAVENOUS
  Filled 2020-08-02 (×7): qty 1

## 2020-08-02 MED ORDER — MORPHINE SULFATE (PF) 2 MG/ML IV SOLN
2.0000 mg | INTRAVENOUS | Status: DC | PRN
  Administered 2020-08-02 – 2020-08-03 (×3): 2 mg via INTRAVENOUS
  Filled 2020-08-02 (×3): qty 1

## 2020-08-02 MED ORDER — PROMETHAZINE HCL 25 MG/ML IJ SOLN
12.5000 mg | Freq: Four times a day (QID) | INTRAMUSCULAR | Status: DC | PRN
  Administered 2020-08-02 (×2): 12.5 mg via INTRAVENOUS
  Filled 2020-08-02 (×2): qty 1

## 2020-08-02 NOTE — Consult Note (Addendum)
[facility] SURGICAL CONSULTATION NOTE (initial) - cpt: 23300   HISTORY OF PRESENT ILLNESS (HPI):  History limited secondary to history of ALS and non-verbal status  49 y.o. female presented to [facility] ED yesterday for evaluation of abdominal pain. Patient with a history of ALS, PEG, tracheostomy secondary to ALS. She was brought to the ED from her assisted living facility secondary to reports of abdominal distension noticed on the morning of presentation. The patient is nonverbal however they felt she was tender to palpation as well. No fever or chills reported at facility. No known previous abdominal surgeries. No history of diverticulitis. Work up in the ED yesterday was concerning for leukocytosis to 21.6K (which is improved to 17K this morning) and CT Abdomen/Pelvis was concerning for acute uncomplicated diverticulitis.   Surgery is consulted by PCCM physician Dr. Erin Fulling, MD in this context for evaluation and management of acute uncomplicated diverticulitis.  PAST MEDICAL HISTORY (PMH):  Past Medical History:  Diagnosis Date  . ALS (amyotrophic lateral sclerosis) (HCC)   . PEG (percutaneous endoscopic gastrostomy) status (HCC)   . Tracheostomy dependence (HCC)      PAST SURGICAL HISTORY (PSH):  History reviewed. No pertinent surgical history.   MEDICATIONS:  Prior to Admission medications   Medication Sig Start Date End Date Taking? Authorizing Provider  ascorbic acid (VITAMIN C) 250 MG tablet Take 250 mg by mouth 2 (two) times daily.    [provider]  atenolol (TENORMIN) 25 MG tablet Place 3 tablets into feeding tube daily. 02/24/16   [provider]  buPROPion (WELLBUTRIN) 100 MG tablet Place 1 tablet into feeding tube 3 (three) times daily. 02/24/16   [provider]  Cholecalciferol (VITAMIN D3) 1000 units CAPS Place 1 capsule into feeding tube 2 (two) times daily.  02/24/16   [provider]  citalopram (CELEXA) 20 MG tablet  Take 20 mg by mouth daily.    [provider]  clonazePAM (KLONOPIN) 0.5 MG tablet Place 0.25 mg into feeding tube 2 (two) times daily as needed.  02/24/16   [provider]  Ferrous Fumarate (FERROCITE) 324 (106 Fe) MG TABS tablet Take 1 tablet by mouth 2 (two) times daily.    [provider]  loratadine (CLARITIN) 10 MG tablet Place 1 tablet into feeding tube daily. 02/24/16   [provider]  Melatonin 1 MG/ML LIQD Place 8 mLs into feeding tube at bedtime. 02/24/16   [provider]  polyethylene glycol (MIRALAX / GLYCOLAX) packet Take 17 g by mouth daily as needed.    [provider]  pregabalin (LYRICA) 50 MG capsule Take 2 capsules by mouth daily. 02/24/16   [provider]  ranitidine (ZANTAC) 15 MG/ML syrup Place 10 mLs into feeding tube daily. 02/24/16   [provider]     ALLERGIES:  Allergies  Allergen Reactions  . Ciprofloxacin Nausea Only and Nausea And Vomiting  . Other     purfume  . Ultram [Tramadol Hcl]      SOCIAL HISTORY:  Social History   Socioeconomic History  . Marital status: Divorced    Spouse name: Not on file  . Number of children: Not on file  . Years of education: Not on file  . Highest education level: Not on file  Occupational History  . Not on file  Tobacco Use  . Smoking status: Former Smoker    Packs/day: 1.00    Years: 30.00    Pack years: 30.00    Types: Cigarettes  Quit date: 12/11/2012    Years since quitting: 7.6  . Smokeless tobacco: Never Used  Substance and Sexual Activity  . Alcohol use: No  . Drug use: No  . Sexual activity: Not on file  Other Topics Concern  . Not on file  Social History Narrative  . Not on file   Social Determinants of Health   Financial Resource Strain:   . Difficulty of Paying Living Expenses: Not on file  Food Insecurity:   . Worried About Programme researcher, broadcasting/film/video in the Last Year: Not on file  . Ran Out of Food in the Last Year: Not on  file  Transportation Needs:   . Lack of Transportation (Medical): Not on file  . Lack of Transportation (Non-Medical): Not on file  Physical Activity:   . Days of Exercise per Week: Not on file  . Minutes of Exercise per Session: Not on file  Stress:   . Feeling of Stress : Not on file  Social Connections:   . Frequency of Communication with Friends and Family: Not on file  . Frequency of Social Gatherings with Friends and Family: Not on file  . Attends Religious Services: Not on file  . Active Member of Clubs or Organizations: Not on file  . Attends Banker Meetings: Not on file  . Marital Status: Not on file  Intimate Partner Violence:   . Fear of Current or Ex-Partner: Not on file  . Emotionally Abused: Not on file  . Physically Abused: Not on file  . Sexually Abused: Not on file     FAMILY HISTORY:  No family history on file.    REVIEW OF SYSTEMS:  Review of Systems  Unable to perform ROS: Patient nonverbal  Gastrointestinal: Positive for abdominal pain.    VITAL SIGNS:  Temp:  [97.8 F (36.6 C)-98.6 F (37 C)] 98.6 F (37 C) (08/23 0400) Pulse Rate:  [71-126] 71 (08/23 0500) Resp:  [12-24] 17 (08/23 0500) BP: (92-113)/(63-75) 95/73 (08/23 0400) SpO2:  [88 %-99 %] 99 % (08/23 0500) FiO2 (%):  [28 %] 28 % (08/23 0249) Weight:  [71.1 kg] 71.1 kg (08/22 1722)     Height: 5\' 6"  (167.6 cm) Weight: 71.1 kg BMI (Calculated): 25.31   INTAKE/OUTPUT:  No intake/output data recorded.  PHYSICAL EXAM:  Physical Exam Vitals and nursing note reviewed. Exam conducted with a chaperone present.  Constitutional:      General: She is not in acute distress.    Appearance: She is well-developed. She is not ill-appearing.     Comments: Patient non-verbal, shakes head yes/no to basic questions  HENT:     Head: Normocephalic and atraumatic.     Mouth/Throat:     Comments: Tracheostomy in place Eyes:     General: No scleral icterus.    Extraocular Movements:  Extraocular movements intact.  Cardiovascular:     Rate and Rhythm: Regular rhythm. Tachycardia present.     Heart sounds: Normal heart sounds. No murmur heard.   Pulmonary:     Effort: No respiratory distress.     Breath sounds: Normal breath sounds.     Comments: On ventilator Abdominal:     General: There is distension.     Palpations: Abdomen is soft.     Tenderness: There is abdominal tenderness in the left lower quadrant. There is no guarding or rebound.     Comments: Abdomen appears distended, she winces with palpation in LLQ, I do not appreciate any evidence of peritonitis  Genitourinary:    Comments: Deferred, Foley in Place Skin:    General: Skin is warm and dry.     Coloration: Skin is not jaundiced or pale.  Neurological:     Mental Status: She is alert.     Comments: Changes consistent with ALS  Psychiatric:     Comments: Unable to reliable assess      Labs:  CBC Latest Ref Rng & Units 08/02/2020 08/01/2020 08/31/2016  WBC 4.0 - 10.5 K/uL 17.6(H) 21.6(H) 17.0(H)  Hemoglobin 12.0 - 15.0 g/dL 16.1 09.6 04.5  Hematocrit 36 - 46 % 40.1 44.7 38.1  Platelets 150 - 400 K/uL 280 298 415   CMP Latest Ref Rng & Units 08/02/2020 08/01/2020 08/31/2016  Glucose 70 - 99 mg/dL 83 409(W) 119(J)  BUN 6 - 20 mg/dL 18 20 20   Creatinine 0.44 - 1.00 mg/dL ) 4.78(G) <9.56(O)  Sodium 135 - 145 mmol/L 142 139 138  Potassium 3.5 - 5.1 mmol/L 3.7 4.0 4.3  Chloride 98 - 111 mmol/L 106 102 105  CO2 22 - 32 mmol/L 24 25 29   Calcium 8.9 - 10.3 mg/dL 9.3 9.6 9.2  Total Protein 6.5 - 8.1 g/dL - 8.0 -  Total Bilirubin 0.3 - 1.2 mg/dL - 0.7 -  Alkaline Phos 38 - 126 U/L - 95 -  AST 15 - 41 U/L - 22 -  ALT 0 - 44 U/L - 17 -    Imaging studies:   CT Abdomen/Pelvis (08/01/2020) personally reviewed showing diffuse diverticulosis with inflammatory changes in the descending/sigmoid colon concerning for diverticulitis without abscess or free air, also with significant stool in rectum, and  radiologist report reviewed:  IMPRESSION: 1. Signs of acute diverticulitis along the descending sigmoid junction in the setting of pancolonic diverticulosis. No abscess or free air. 2. Fecal impaction 3. RIGHT-sided nephrolithiasis with large calculi as described. 4. Muscular atrophy and osteopenia likely related to primary diagnosis of ALS. Thinning of the sacrococcygeal region with respect of bone may relate to prior pressure ulceration. There is no stranding in the fat to suggest decubitus changes at this time in this location. Mild stranding is noted overlying the RIGHT ischium. 5. Aortic atherosclerosis.    Assessment/Plan: (ICD-10's: K41.92) 49 y.o. female with leukocytosis and abdominal pain concerning for acute uncomplicated diverticulitis, complicated by pertinent comorbidities including ALS.   - Appreciate PCCM admission  - Continue NPO for now  - Continue IV Abx (Zosyn)   - Monitor leukocytosis; fever curve  - Monitor abdominal examination  - Pain control prn; antiemetics prn  - No emergent surgical intervention  - Agree palliative consultation   - Further management per primary service; we will follow   All of the above findings and recommendations were discussed with the patient and the medical team  Thank you for the opportunity to participate in this patient's care.   -- K58.98, PA-C [facility] 08/02/2020, 7:30 AM [phone] M-F: 7am - 4pm

## 2020-08-02 NOTE — Progress Notes (Signed)
Pts vitals have remained WNL throughout shift. Pt Alert and able to follow commands and make mouth words. On home V-Trach. Pt has complained of nausea and pain throughout shift. Gave prns am monitoring QT interval due to Zofran and phenergan being administered. Mother and pt consented to PICC line being put in. VTE prophylaxis started. Updated mother on phone as well as confirmed Code status. pts Mom stated that pt has been spoken to by multiple doctors concerning her code status and what that will mean  And pt still wants to remain a Full code. I confirmed with pt as well. MD made aware. Will continue to monitor.

## 2020-08-02 NOTE — Progress Notes (Signed)
Initial Nutrition Assessment  DOCUMENTATION CODES:   Not applicable  INTERVENTION:  Once ready to resume tube feeds recommend switching to fiber-free formula during acute diverticulitis: -Recommend Osmolite 1.5 Cal at 75 mL/hr from 0900-1900 (750 mL goal daily volume). Provides 1125 kcal, 47 grams of protein, 570 mL H2O daily.  NUTRITION DIAGNOSIS:   Inadequate oral intake related to inability to eat, dysphagia as evidenced by NPO status (reliance on tube feeds and free water flush via G-tube to meet calorie/protein/hydration needs.).  GOAL:   Patient will meet greater than or equal to 90% of their needs  MONITOR:   Labs, Weight trends, TF tolerance, I & O's  REASON FOR ASSESSMENT:   Ventilator    ASSESSMENT:   49 year old female with PMHx of ALS with chronic trach and vent dependent, G-tube admitted with sepsis and acute diverticulitis.   Assessed patient at bedside. Patient with significant muscle atrophy and weakness from ALS where she can no longer use her extremities. Patient able to mouth yes and no but difficult to tell what she is mouthing at times. Patient did endorse nausea today when RD at bedside. Spoke with patient's mother over the phone. She reports patient's home TF regimen is Jevity 1.5 Cal 3 cartons per day. Tube feeds run at 75 mL/hr from approximately 0900-1900 but schedule may vary. This provides 1065 kcal, 45.3 grams of protein, 15 grams of fiber, 540 kcal daily. Patient receives 30-60 mL water flushes with medications (4 times per day) and then 30 mL free water flush 4 times daily. This is a total of approximately 780-900 mL H2O daily total including water in tube feeding  Patient's mother reports they are unable to weigh patient at home so she is not sure of weight trend. She does endorse patient has lost muscle mass but this is expected with ALS. According to chart patient was 57.4 kg on 08/18/2016, 64.3 kg on 08/28/2016, and is currently 71.1 kg (156.75  lbs).  Patient is currently on ventilator support via trach MV: 5.2 L/min Temp (24hrs), Avg:98.4 F (36.9 C), Min:98.2 F (36.8 C), Max:98.6 F (37 C)  Medications reviewed and include: Zosyn.  Labs reviewed: Creatinine 0.34.  Enteral Access: 18 Fr. G-tube present on admission  Discussed with RN and on rounds. Plan per surgery is to hold tube feeds at this time.  Patient does not meet criteria for malnutrition at this time. Although patient's home TF regimen provides lower kcal/protein than RD is estimating patient is weight-stable, does not meet criteria for malnutrition, and skin is intact. Will continue TF regimen similar to home regimen when able to resume tube feeds.  NUTRITION - FOCUSED PHYSICAL EXAM:    Most Recent Value  Orbital Region No depletion  Upper Arm Region No depletion  Thoracic and Lumbar Region No depletion  Buccal Region No depletion  Temple Region No depletion  Clavicle Bone Region No depletion  Clavicle and Acromion Bone Region Mild depletion  Scapular Bone Region Unable to assess  Dorsal Hand Moderate depletion  Patellar Region Severe depletion  Anterior Thigh Region Severe depletion  Posterior Calf Region Severe depletion  Edema (RD Assessment) Mild  Hair Reviewed  Eyes Reviewed  Mouth Unable to assess  Skin Reviewed  Nails Reviewed     Diet Order:   Diet Order            Diet NPO time specified  Diet effective now  EDUCATION NEEDS:   No education needs have been identified at this time  Skin:  Skin Assessment: Reviewed RN Assessment  Last BM:  08/01/2020  Height:   Ht Readings from Last 1 Encounters:  08/01/20 5\' 6"  (1.676 m)   Weight:   Wt Readings from Last 1 Encounters:  08/01/20 71.1 kg   BMI:  Body mass index is 25.3 kg/m.  Estimated Nutritional Needs:   Kcal:  1420  Protein:  70-80 grams  Fluid:  1.2-1.4 L/day  08/03/20, MS, RD, LDN Pager number available on Amion

## 2020-08-02 NOTE — Progress Notes (Signed)
VS stable, Urology place coude catheter at beginning of shift, patient had some pain during this procedure so I gave her Hycet for pain per prn order. She was pain free after this. This morning she complains of nausea zofran given. Will continue to monitor

## 2020-08-02 NOTE — Progress Notes (Signed)
Jeri Modena, NP notified that patient states that she is nauseated. See new order.

## 2020-08-02 NOTE — Progress Notes (Signed)
Referral received for goals of care. Due to increase consult volumes and limited availability their will be a delay in seeing this patient.  °  °If you have urgent needs requiring recommendations of care while consult is pending please contact our team line at [phone] and one of our providers will attempt to assist.  °  °Every attempt will be made to see patient in upcoming days if hospitalized.  °  °Thank you for your referral.  °  °Palliative Medicine Team  °

## 2020-08-02 NOTE — Plan of Care (Signed)
Requested VTE prophylaxis order from NP, he stated he would look at chart and order

## 2020-08-02 NOTE — Progress Notes (Signed)
Name: Brenda Anderson MRN: [id number] DOB: October 03, 1971     CONSULTATION DATE: 08/02/2020  REFERRING MD : Scotty Court  CHIEF COMPLAINT: Abd pain  HISTORY OF PRESENT ILLNESS: Remains sepsis with acute diverticulitis Chronic vent +Tearful gen surgery following +abd pain +nausea   PCCM called to admit patient with acute Diverticulitis and sepsis  Chronic Trach and Vent dependant, Dx of ALS   PAST MEDICAL HISTORY :   has a past medical history of ALS (amyotrophic lateral sclerosis) (HCC), PEG (percutaneous endoscopic gastrostomy) status (HCC), and Tracheostomy dependence (HCC).  has no past surgical history on file. Prior to Admission medications   Medication Sig Start Date End Date Taking? Authorizing Provider  ascorbic acid (VITAMIN C) 250 MG tablet Take 250 mg by mouth 2 (two) times daily.    [provider]  atenolol (TENORMIN) 25 MG tablet Place 3 tablets into feeding tube daily. 02/24/16   [provider]  buPROPion (WELLBUTRIN) 100 MG tablet Place 1 tablet into feeding tube 3 (three) times daily. 02/24/16   [provider]  Cholecalciferol (VITAMIN D3) 1000 units CAPS Place 1 capsule into feeding tube 2 (two) times daily.  02/24/16   [provider]  citalopram (CELEXA) 20 MG tablet Take 20 mg by mouth daily.    [provider]  clonazePAM (KLONOPIN) 0.5 MG tablet Place 0.25 mg into feeding tube 2 (two) times daily as needed.  02/24/16   [provider]  Ferrous Fumarate (FERROCITE) 324 (106 Fe) MG TABS tablet Take 1 tablet by mouth 2 (two) times daily.    [provider]  loratadine (CLARITIN) 10 MG tablet Place 1 tablet into feeding tube daily. 02/24/16   [provider]  Melatonin 1 MG/ML LIQD Place 8 mLs into feeding tube at bedtime. 02/24/16   [provider]  polyethylene glycol (MIRALAX / GLYCOLAX) packet Take 17 g by mouth daily as needed.    [provider]  pregabalin (LYRICA) 50 MG  capsule Take 2 capsules by mouth daily. 02/24/16   [provider]  ranitidine (ZANTAC) 15 MG/ML syrup Place 10 mLs into feeding tube daily. 02/24/16   [provider]   Allergies  Allergen Reactions  . Ciprofloxacin Nausea Only and Nausea And Vomiting  . Other     purfume  . Ultram [Tramadol Hcl]     Review of Systems: NON verbal Gen:  abd pain HEENT: Denies blurred vision, double vision, ear pain, eye pain, hearing loss, nose bleeds, sore throat Cardiac:  No dizziness, chest pain or heaviness, chest tightness,edema, No JVD Resp:   No cough, -sputum production, -shortness of breath,-wheezing, -hemoptysis,  Gi:+N, +ABD pain   Other:  All other systems negative    VITAL SIGNS: Temp:  [97.8 F (36.6 C)-98.6 F (37 C)] 98.2 F (36.8 C) (08/23 0800) Pulse Rate:  [71-126] 100 (08/23 1000) Resp:  [12-24] 12 (08/23 1000) BP: (92-113)/(63-75) 110/75 (08/23 1000) SpO2:  [88 %-100 %] 100 % (08/23 1000) FiO2 (%):  [28 %] 28 % (08/23 0847) Weight:  [71.1 kg] 71.1 kg (08/22 1722)     SpO2: 100 % O2 Flow Rate (L/min): 2 L/min FiO2 (%): 28 %    Physical Examination:   GENERAL:NAD, chronic Vent, s/p trach NECK: Supple. S/p trach PULMONARY: CTA B/L no wheezing, rhonchi, crackles CARDIOVASCULAR: S1 and S2. Regular rate and rhythm. No murmurs GASTROINTESTINAL: +tenderness left LLQ  MUSCULOSKELETAL: No swelling, clubbing, or edema.  NEUROLOGIC:quadriplegic all extremities SKIN: No ulceration, lesions, rashes, or cyanosis.  ALL OTHER ROS ARE NEGATIVE   MEDICATIONS: I have reviewed all medications and confirmed regimen as documented      IMAGING    CT ABDOMEN PELVIS W CONTRAST  Result Date: 08/01/2020 CLINICAL DATA:  Abdominal distension, lower abdominal pain EXAM: CT ABDOMEN AND PELVIS WITH CONTRAST TECHNIQUE: Multidetector CT imaging of the abdomen and pelvis was performed using the standard protocol following bolus administration of intravenous  contrast. CONTRAST:  OMNIPAQUE IOHEXOL 300 MG/ML  SOLN COMPARISON:  May 01, 2013 FINDINGS: Lower chest: Basilar atelectasis. No consolidation. No pleural effusion. Hepatobiliary: Liver without focal, suspicious lesion. Portal vein is patent. No pericholecystic stranding. No biliary duct dilation. Pancreas: Pancreas is normal without ductal dilation or inflammation. Spleen: Spleen normal in size and contour. Adrenals/Urinary Tract: Adrenal glands are normal. No signs of hydronephrosis. Nephrolithiasis. Large calculus in the RIGHT upper pole measuring approximately 1.6 x 1.0 cm. Smaller calculi in the RIGHT renal pelvis and a smaller interpolar calculus in an interpolar calyx measuring approximately 7 mm. Normal appearance of the LEFT kidney Stomach/Bowel: Signs of acute diverticulitis along the descending sigmoid junction. Rectal distension up to 8 cm.  Pancolonic diverticulosis. Gastric tube in situ similar to the prior study. No acute small bowel process. Vascular/Lymphatic: Scattered atheromatous plaque of the abdominal aorta tracking in the iliac vessels. No adenopathy in the retroperitoneum. No adenopathy in the pelvis. Reproductive: No adnexal mass. Other: No free air.  No abscess. Musculoskeletal: Thinning of the sacrum and marked osteopenia with loss of muscular density, replacement with fatty atrophy. Bilateral breast implants. No acute musculoskeletal process. IMPRESSION: 1. Signs of acute diverticulitis along the descending sigmoid junction in the setting of pancolonic diverticulosis. No abscess or free air. 2. Fecal impaction 3. RIGHT-sided nephrolithiasis with large calculi as described. 4. Muscular atrophy and osteopenia likely related to primary diagnosis of ALS. Thinning of the sacrococcygeal region with respect of bone may relate to prior pressure ulceration. There is no stranding in the fat to suggest decubitus changes at this time in this location. Mild stranding is noted overlying the RIGHT  ischium. 5. Aortic atherosclerosis. Aortic Atherosclerosis (ICD10-I70.0). Electronically Signed   By: Donzetta Kohut M.D.   On: 08/01/2020 15:21   [place] EKG SITE RITE  Result Date: 08/02/2020 If [facility] image not attached, placement could not be confirmed due to current cardiac rhythm.      ASSESSMENT AND PLAN SYNOPSIS  Acute SEPSIS due to acute DIVERTICULITIS SEPSIS PROTOCOL LA 1.5 IV ABX IVF'S GEN SURGERY CONSULTATION KEEP NPO PER PEG  CHRONIC RESP FAILURE VENT SUPPORT OXYGEN AS NEEDED  ELECTROLYTES -follow labs as needed -replace as needed -pharmacy consultation and following    DVT/GI PRX ordered and assessed TRANSFUSIONS AS NEEDED MONITOR FSBS I Assessed the need for Labs I Assessed the need for Foley I Assessed the need for Central Venous Line Family Discussion when available I Assessed the need for Mobilization I made an Assessment of medications to be adjusted accordingly Safety Risk assessment completed  CASE DISCUSSED IN MULTIDISCIPLINARY ROUNDS WITH ICU TEAM     Arneshia Ade [name], M.D.  [facility] Pulmonary & Critical Care Medicine  Medical Director [facility] Medical Director [facility] Cardio-Pulmonary Department

## 2020-08-02 NOTE — Progress Notes (Signed)
   08/02/20 1400  Clinical Encounter Type  Visited With Patient;Health care provider  Visit Type Initial  Referral From Nurse  Consult/Referral To Chaplain  Chaplain stopped by to check on patient. Chaplain told patient she will be available to be with her. Chaplain will check back in on patient tomorrow.

## 2020-08-02 NOTE — Progress Notes (Signed)
Peripherally Inserted Central Catheter Placement  The IV Nurse has discussed with the patient and/or persons authorized to consent for the patient, the purpose of this procedure and the potential benefits and risks involved with this procedure.  The benefits include less needle sticks, lab draws from the catheter, and the patient may be discharged home with the catheter. Risks include, but not limited to, infection, bleeding, blood clot (thrombus formation), and puncture of an artery; nerve damage and irregular heartbeat and possibility to perform a PICC exchange if needed/ordered by physician.  Alternatives to this procedure were also discussed.  Bard Power PICC patient education guide, fact sheet on infection prevention and patient information card has been provided to patient /or left at bedside.    PICC Placement Documentation  PICC Double Lumen 08/02/20 PICC Right Brachial 39 cm 1 cm (Active)  Indication for Insertion or Continuance of Line Prolonged intravenous therapies 08/02/20 1342  Exposed Catheter (cm) 1 cm 08/02/20 1342  Site Assessment Clean;Dry;Intact 08/02/20 1342  Lumen #1 Status Flushed;Blood return noted 08/02/20 1342  Lumen #2 Status Flushed;Blood return noted 08/02/20 1342  Dressing Type Transparent 08/02/20 1342  Dressing Status Clean;Dry;Intact;Antimicrobial disc in place;Other (Comment) 08/02/20 1342  Dressing Intervention New dressing 08/02/20 1342  Dressing Change Due 08/09/20 08/02/20 1342   Telephone conssent signed by mother    Maximino Greenland 08/02/2020, 1:43 PM

## 2020-08-03 DIAGNOSIS — Z9911 Dependence on respirator [ventilator] status: Secondary | ICD-10-CM

## 2020-08-03 DIAGNOSIS — K5792 Diverticulitis of intestine, part unspecified, without perforation or abscess without bleeding: Secondary | ICD-10-CM

## 2020-08-03 LAB — BASIC METABOLIC PANEL
Anion gap: 20 — ABNORMAL HIGH (ref 5–15)
BUN: 24 mg/dL — ABNORMAL HIGH (ref 6–20)
CO2: 17 mmol/L — ABNORMAL LOW (ref 22–32)
Calcium: 9.2 mg/dL (ref 8.9–10.3)
Chloride: 106 mmol/L (ref 98–111)
Creatinine, Ser: 0.49 mg/dL (ref 0.44–1.00)
GFR calc Af Amer: >60 mL/min (ref >60)
GFR calc non Af Amer: >60 mL/min (ref >60)
Glucose, Bld: 60 mg/dL — ABNORMAL LOW (ref 70–99)
Potassium: 2.7 mmol/L — CL (ref 3.5–5.1)
Sodium: 143 mmol/L (ref 135–145)

## 2020-08-03 LAB — CBC
HCT: 40.8 % (ref 36.0–46.0)
Hemoglobin: 12.7 g/dL (ref 12.0–15.0)
MCH: 31.1 pg (ref 26.0–34.0)
MCHC: 31.1 g/dL (ref 30.0–36.0)
MCV: 100 fL (ref 80.0–100.0)
Platelets: 299 10*3/uL (ref 150–400)
RBC: 4.08 MIL/uL (ref 3.87–5.11)
RDW: 14.1 % (ref 11.5–15.5)
WBC: 17.9 10*3/uL — ABNORMAL HIGH (ref 4.0–10.5)
nRBC: 0 % (ref 0.0–0.2)

## 2020-08-03 LAB — POTASSIUM: Potassium: 3.9 mmol/L (ref 3.5–5.1)

## 2020-08-03 MED ORDER — MORPHINE SULFATE (PF) 2 MG/ML IV SOLN
2.0000 mg | INTRAVENOUS | Status: DC | PRN
  Administered 2020-08-03 – 2020-08-13 (×29): 2 mg via INTRAVENOUS
  Filled 2020-08-03 (×30): qty 1

## 2020-08-03 MED ORDER — ACETAMINOPHEN 10 MG/ML IV SOLN
1000.0000 mg | Freq: Four times a day (QID) | INTRAVENOUS | Status: AC
  Administered 2020-08-03 – 2020-08-04 (×4): 1000 mg via INTRAVENOUS
  Filled 2020-08-03 (×4): qty 100

## 2020-08-03 MED ORDER — FLEET ENEMA 7-19 GM/118ML RE ENEM
1.0000 | ENEMA | Freq: Every day | RECTAL | Status: DC | PRN
  Administered 2020-08-03: 1 via RECTAL

## 2020-08-03 MED ORDER — KETOROLAC TROMETHAMINE 30 MG/ML IJ SOLN: 30.0000 mg | Freq: Four times a day (QID) | INTRAMUSCULAR | Status: DC | PRN

## 2020-08-03 MED ORDER — FAMOTIDINE IN NACL 20-0.9 MG/50ML-% IV SOLN
20.0000 mg | Freq: Two times a day (BID) | INTRAVENOUS | Status: DC
  Administered 2020-08-03 – 2020-08-04 (×3): 20 mg via INTRAVENOUS
  Filled 2020-08-03 (×3): qty 50

## 2020-08-03 MED ORDER — POTASSIUM CHLORIDE 10 MEQ/50ML IV SOLN
10.0000 meq | INTRAVENOUS | Status: AC
  Administered 2020-08-03 (×5): 10 meq via INTRAVENOUS
  Filled 2020-08-03 (×5): qty 50

## 2020-08-03 NOTE — Consult Note (Signed)
Consultation Note Date: 08/03/2020   Patient Name: Brenda Anderson  DOB: 03/24/1971  MRN: [id number]  Age / Sex: 49 y.o., female  PCP: System, [place] Not In Referring Physician: Flora Lipps, MD  Reason for Consultation: Establishing goals of care  HPI/Patient Profile: 49 y.o. female  with past medical history of ALS (trach and vent dependen since 2017) admitted on 08/01/2020 from home with sepsis r/t diverticulitis. Palliative medicine consulted for goals of care.    Clinical Assessment and Goals of Care: Evaluated patient. Chart reviewed.  Brenda Anderson awake and alert. She is able to mouth words, but not able to phonate. She tells me she understands she has diverticulitis. She was living at home prior to admission. Her Mom is her surrogate Media planner. Brenda Anderson's goals are continued aggressive medical care including CPR if her heart were to stop. She hopes to return home with her Mom.  I called her MomBethena Anderson for more detailed discussion. Per Brenda Anderson- she has seen Brenda Anderson decline with each insult to her body. Prior to admission she was able to move her legs some. She has an eye gaze computer at home that she uses for communication. Tomorrow has been seen by outpatient Palliative at [facility], and was on Hospice at one time, but is no longer as Hospice will not take her on the vent.  Brenda Anderson agrees with [name]'s goals of care. They have home health at home- she is afforded 14 hours of care- but this has been difficult to get fully due to current COVID situation.  Brenda Anderson is concerned about her hearing and also wants to ensure that Brenda Anderson's home meds are restarted.  EOL wishes were discussed- when asked if Brenda Anderson had ever expressed when there might be a time that she did not want to continue aggressive measures- or that she would not want CPR-  Brenda Anderson states that Brenda Anderson actively avoids these discussions- that she adamantly does not wish to talk  about it.  Primary Decision Maker PATIENT and mother Brenda Anderson    SUMMARY OF RECOMMENDATIONS -Continue current care -Restart Flonase daily -Please restart patient's home Wellbutrin, citalopram, baclofen and pregabalin asap to prevent withdrawal and maintain her ALS symptom management -Recommend Palliative outpatient to follow at discharge  Code Status/Advance Care Planning:  Full code  Prognosis:    Unable to determine  Discharge Planning: Home with Palliative Services  Primary Diagnoses: Present on Admission: . Septic shock ([place])   I have reviewed the medical record, interviewed the patient and family, and examined the patient. The following aspects are pertinent.  Past Medical History:  Diagnosis Date  . ALS (amyotrophic lateral sclerosis) ([employer])   . PEG (percutaneous endoscopic gastrostomy) status ([place])   . 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 Marital status: Divorced    Spouse name: Not on file  . Number of children: Not on file  . Years of education: Not on file  . Highest education level: Not on file  Occupational History  . Not on file  Tobacco Use  . Smoking status: Former Smoker    Packs/day: 1.00    Years: 30.00    Pack years: 30.00    Types: Cigarettes    Quit date: 12/11/2012    Years since quitting: 7.6  . Smokeless tobacco: Never Used  Substance and Sexual Activity  . Alcohol use: No  . Drug use: No  . Sexual activity: Not on file  Other Topics Concern  . Not on file  Social History Narrative  . Not on file   Social Determinants of Health   Financial Resource Strain:   . Difficulty of Paying Living Expenses: Not on file  Food Insecurity:   . Worried About Charity fundraiser in the Last Year: Not on file  . Ran Out of Food in the Last Year: Not on file  Transportation Needs:   . Lack of Transportation (Medical): Not on file  . Lack of Transportation (Non-Medical): Not on file  Physical Activity:   .  Days of Exercise per Week: Not on file  . Minutes of Exercise per Session: Not on file  Stress:   . Feeling of Stress : Not on file  Social Connections:   . Frequency of Communication with Friends and Family: Not on file  . Frequency of Social Gatherings with Friends and Family: Not on file  . Attends Religious Services: Not on file  . Active Member of Clubs or Organizations: Not on file  . Attends Archivist Meetings: Not on file  . Marital Status: Not on file   No family history on file. Scheduled Meds: . chlorhexidine gluconate (MEDLINE KIT)  15 mL Mouth Rinse BID  . Chlorhexidine Gluconate Cloth  6 each Topical Daily  . enoxaparin (LOVENOX) injection  40 mg Subcutaneous Q24H  . mouth rinse  15 mL Mouth Rinse 10 times per day  .  morphine injection  2 mg Intravenous Once  . sodium chloride flush  10-40 mL Intracatheter Q12H   Continuous Infusions: . acetaminophen 1,000 mg (08/03/20 1250)  . famotidine (PEPCID) IV 20 mg (08/03/20 1200)  . piperacillin-tazobactam (ZOSYN)  IV 3.375 g (08/03/20 0745)  . potassium chloride 10 mEq (08/03/20 1157)   PRN Meds:.HYDROcodone-acetaminophen, morphine injection, ondansetron (ZOFRAN) IV, promethazine, sodium chloride flush Medications Prior to Admission:  Prior to Admission medications   Medication Sig Start Date End Date Taking? Authorizing Provider  acetaminophen (TYLENOL) 325 MG tablet  12/13/15  Yes [provider]  baclofen (LIORESAL) 10 MG tablet Take 20 mg by mouth 4 (four) times daily.  06/28/20  Yes [provider]  buPROPion (WELLBUTRIN) 100 MG tablet Place 1 tablet into feeding tube 3 (three) times daily. 02/24/16  Yes [provider]  Cholecalciferol (VITAMIN D3) 125 MCG (5000 UT) CAPS 1 SOFTGEL CAPSULE ONCE A DAY GTUBE   Yes [provider]  citalopram (CELEXA) 20 MG tablet Take 20 mg by mouth daily.   Yes [provider]  Melatonin 1 MG/ML LIQD Place 8 mLs into feeding tube at  bedtime. 02/24/16  Yes [provider]  montelukast (SINGULAIR) 10 MG tablet Take 10 mg by mouth at bedtime.  07/13/20  Yes [provider]  polyethylene glycol (MIRALAX / GLYCOLAX) packet Take 17 g by mouth daily as needed.   Yes [provider]  pregabalin (LYRICA) 100 MG capsule SMARTSIG:1 Capsule(s) Gastro Tube Twice Daily 06/10/20  Yes [provider]  pregabalin (LYRICA) 25 MG capsule TAKE 1 CAPSULE BY MOUTH EVERY DAY AS NEEDED  FOR BURNING PAIN (G TUBE)   Yes [provider]   Allergies  Allergen Reactions  . Ciprofloxacin Nausea Only and Nausea And Vomiting  . Other     purfume  . Ultram [Tramadol Hcl]    Review of Systems  Physical Exam Vitals and nursing note reviewed.  Skin:    Coloration: Skin is pale.  Neurological:     Mental Status: She is alert.     Comments: Generalized neuro and motor weakness, pretty much quadraplegic, unable to phonate, can move mouth and eyes     Vital Signs: BP 113/64   Pulse (!) 106   Temp 98.4 F (36.9 C)   Resp 13   Ht 5' 6"  (1.676 m)   Wt 71.1 kg   SpO2 99%   BMI 25.30 kg/m  Pain Scale: FLACC POSS *See Group Information*: S-Acceptable,Sleep, easy to arouse Pain Score: 0-No pain   SpO2: SpO2: 99 % O2 Device:SpO2: 99 % O2 Flow Rate: .O2 Flow Rate (L/min): 2 L/min  IO: Intake/output summary:   Intake/Output Summary (Last 24 hours) at 08/03/2020 1300 Last data filed at 08/03/2020 0500 Gross per 24 hour  Intake 140.62 ml  Output 1100 ml  Net -959.38 ml    LBM: Last BM Date: 08/01/20 Baseline Weight: Weight: 71.1 kg Most recent weight: Weight: 71.1 kg     Palliative Assessment/Data: PPS: 10%     Thank you for this consult. Palliative medicine will continue to follow and assist as needed.   Time In: 1100 Time Out: 1210 Time Total: 70 minutes Greater than 50%  of this time was spent counseling and coordinating care related to the above assessment and plan.  Signed by: Mariana Kaufman, AGNP-C Palliative Medicine    Please contact Palliative Medicine Team phone at [phone] for questions and concerns.  For individual provider: See Shea Evans

## 2020-08-03 NOTE — Progress Notes (Signed)
1300 Given fleets enema per orders-verbal order. Patient had extremely large soft brown BM.

## 2020-08-03 NOTE — Progress Notes (Signed)
PHARMACY CONSULT NOTE - FOLLOW UP  Pharmacy Consult for Electrolyte Monitoring and Replacement   Recent Labs: Potassium (mmol/L)  Date Value  08/03/2020 3.9  04/01/2014 4.0   Magnesium (mg/dL)  Date Value  39/76/7341 2.0  05/07/2013 1.9   Calcium (mg/dL)  Date Value  93/79/0240 9.2   Calcium, Total (mg/dL)  Date Value  97/35/3299 9.2   Albumin (g/dL)  Date Value  24/26/8341 4.0  05/07/2013 1.7 (L)   Phosphorus (mg/dL)  Date Value  96/22/2979 3.3  05/07/2013 2.8   Sodium (mmol/L)  Date Value  08/03/2020 143  04/01/2014 141     Assessment: 49 year old female with ALS admitted with diverticulitis. Patient with PEG and trach. PEG tube currently on gravity, no meds/feeding. NPO. Pharmacy to manage electrolytes.  Goal of Therapy:  Electrolytes WNL  Plan:  Potassium low this morning. Holding tube feeds, hypokalemia likely a result. Will replace with potassium 10 mEq IV x 5 runs. Will recheck potassium this evening. All electrolytes with morning labs.  8/24:  K @ 2121 = 3.9 No additional K needed at this time ,  Will recheck K on 8/25 with AM labs.   Scherrie Gerlach ,PharmD Clinical Pharmacist 08/03/2020 10:56 PM

## 2020-08-03 NOTE — Progress Notes (Signed)
PHARMACY CONSULT NOTE - FOLLOW UP  Pharmacy Consult for Electrolyte Monitoring and Replacement   Recent Labs: Potassium (mmol/L)  Date Value  08/03/2020 2.7 (LL)  04/01/2014 4.0   Magnesium (mg/dL)  Date Value  97/94/8016 2.0  05/07/2013 1.9   Calcium (mg/dL)  Date Value  55/37/4827 9.2   Calcium, Total (mg/dL)  Date Value  07/86/7544 9.2   Albumin (g/dL)  Date Value  92/12/69 4.0  05/07/2013 1.7 (L)   Phosphorus (mg/dL)  Date Value  21/97/5883 3.3  05/07/2013 2.8   Sodium (mmol/L)  Date Value  08/03/2020 143  04/01/2014 141     Assessment: 49 year old female with ALS admitted with diverticulitis. Patient with PEG and trach. PEG tube currently on gravity, no meds/feeding. NPO. Pharmacy to manage electrolytes.  Goal of Therapy:  Electrolytes WNL  Plan:  Potassium low this morning. Holding tube feeds, hypokalemia likely a result. Will replace with potassium 10 mEq IV x 5 runs. Will recheck potassium this evening. All electrolytes with morning labs.  Pricilla Riffle ,PharmD Clinical Pharmacist 08/03/2020 2:13 PM

## 2020-08-03 NOTE — Progress Notes (Signed)
Name: Brenda Anderson MRN: [id number] DOB: 04-May-1971     CONSULTATION DATE: 08/03/2020  REFERRING MD : Scotty Court  CHIEF COMPLAINT: Abd pain  HISTORY OF PRESENT ILLNESS: Remains sepsis with acute diverticulitis Chronic vent +Tearful gen surgery following +abd pain +nausea   PCCM called to admit patient with acute Diverticulitis and sepsis  Chronic Trach and Vent dependant, Dx of ALS   PAST MEDICAL HISTORY :   has a past medical history of ALS (amyotrophic lateral sclerosis) (H[facility]), PEG (percutaneous endoscopic gastrostomy) status (HCC), and Tracheostomy dependence ([facility]).  has no past surgical history on file. Prior to Admission medications   Medication Sig Start Date End Date Taking? Authorizing Provider  ascorbic acid (VITAMIN C) 250 MG tablet Take 250 mg by mouth 2 (two) times daily.    [provider]  atenolol (TENORMIN) 25 MG tablet Place 3 tablets into feeding tube daily. 02/24/16   [provider]  buPROPion (WELLBUTRIN) 100 MG tablet Place 1 tablet into feeding tube 3 (three) times daily. 02/24/16   [provider]  Cholecalciferol (VITAMIN D3) 1000 units CAPS Place 1 capsule into feeding tube 2 (two) times daily.  02/24/16   [provider]  citalopram (CELEXA) 20 MG tablet Take 20 mg by mouth daily.    [provider]  clonazePAM (KLONOPIN) 0.5 MG tablet Place 0.25 mg into feeding tube 2 (two) times daily as needed.  02/24/16   [provider]  Ferrous Fumarate (FERROCITE) 324 (106 Fe) MG TABS tablet Take 1 tablet by mouth 2 (two) times daily.    [provider]  loratadine (CLARITIN) 10 MG tablet Place 1 tablet into feeding tube daily. 02/24/16   [provider]  Melatonin 1 MG/ML LIQD Place 8 mLs into feeding tube at bedtime. 02/24/16   [provider]  polyethylene glycol (MIRALAX / GLYCOLAX) packet Take 17 g by mouth daily as needed.    [provider]  pregabalin (LYRICA) 50 MG  capsule Take 2 capsules by mouth daily. 02/24/16   [provider]  ranitidine (ZANTAC) 15 MG/ML syrup Place 10 mLs into feeding tube daily. 02/24/16   [provider]   Allergies  Allergen Reactions  . Ciprofloxacin Nausea Only and Nausea And Vomiting  . Other     purfume  . Ultram [Tramadol Hcl]     Review of Systems: NON verbal Gen:  abd pain HEENT: Denies blurred vision, double vision, ear pain, eye pain, hearing loss, nose bleeds, sore throat Cardiac:  No dizziness, chest pain or heaviness, chest tightness,edema, No JVD Resp:   No cough, -sputum production, -shortness of breath,-wheezing, -hemoptysis,  Gi:+N, +ABD pain   Other:  All other systems negative    VITAL SIGNS: Temp:  [98.2 F (36.8 C)-98.6 F (37 C)] 98.6 F (37 C) (08/24 0000) Pulse Rate:  [75-112] 98 (08/24 0600) Resp:  [11-14] 13 (08/24 0600) BP: (101-125)/(60-85) 110/64 (08/24 0600) SpO2:  [97 %-100 %] 99 % (08/24 0600) FiO2 (%):  [28 %] 28 % (08/24 0726)     SpO2: 99 % O2 Flow Rate (L/min): 2 L/min FiO2 (%): 28 %    Physical Examination:   GENERAL:NAD, chronic Vent, s/p trach NECK: Supple. S/p trach PULMONARY: CTA B/L no wheezing, rhonchi, crackles CARDIOVASCULAR: S1 and S2. Regular rate and rhythm. No murmurs GASTROINTESTINAL: +tenderness left LLQ  MUSCULOSKELETAL: No swelling, clubbing, or edema.  NEUROLOGIC:quadriplegic all extremities SKIN: No ulceration, lesions, rashes, or cyanosis.  ALL OTHER ROS ARE NEGATIVE   MEDICATIONS:  I have reviewed all medications and confirmed regimen as documented      IMAGING    [place] EKG SITE RITE  Result Date: 08/02/2020 If Site Rite image not attached, placement could not be confirmed due to current cardiac rhythm.      ASSESSMENT AND PLAN SYNOPSIS  Acute SEPSIS due to acute DIVERTICULITIS SEPSIS PROTOCOL LA 1.5 IV ABX IVF'S GEN SURGERY CONSULTATION KEEP NPO PER PEG  CHRONIC RESP FAILURE VENT SUPPORT OXYGEN AS  NEEDED  ELECTROLYTES -follow labs as needed -replace as needed -pharmacy consultation and following    DVT/GI PRX ordered and assessed TRANSFUSIONS AS NEEDED MONITOR FSBS I Assessed the need for Labs I Assessed the need for Foley I Assessed the need for Central Venous Line Family Discussion when available I Assessed the need for Mobilization I made an Assessment of medications to be adjusted accordingly Safety Risk assessment completed  CASE DISCUSSED IN MULTIDISCIPLINARY ROUNDS WITH ICU TEAM     Staci Acosta, M.D., M.P.H.  Pulmonary & Critical Care Medicine

## 2020-08-03 NOTE — Progress Notes (Signed)
Pt continued on her home vent/28%/5/12/450, VSS, pt is alert and mouth words, very hard to understand her. Peg tube draining greenish slippery substance. adquate urine output. Pt nauseated all night PRN phenergan  Given x 3 times. Slept well.

## 2020-08-03 NOTE — Progress Notes (Signed)
[facility] SURGICAL PROGRESS NOTE (cpt [phone])  Hospital Day(s): 2.   Interval History: Patient seen and examined, no acute events or new complaints overnight. Patient non-verbal but shakes head some to participate in history. Still shakes head "yes" when asked about abdominal pain. Otherwise unable to obtain much history. Leukocytosis is stable at 17.9K but no fevers. sCr  Normal at 0.49 with UO - 700 ccs in last 24 hours. She does have hypokalemia to 2.7. She has been NPO and tube feeds are held.   Review of Systems:  Unable to reliably preform secondary to nonverbal status  Vital signs in last 24 hours: [min-max] current  Temp:  [98.2 F (36.8 C)-98.6 F (37 C)] 98.6 F (37 C) (08/24 0000) Pulse Rate:  [75-112] 98 (08/24 0600) Resp:  [11-14] 13 (08/24 0600) BP: (101-125)/(60-85) 110/64 (08/24 0600) SpO2:  [97 %-100 %] 99 % (08/24 0600) FiO2 (%):  [28 %] 28 % (08/24 0726)     Height: 5\' 6"  (167.6 cm) Weight: 71.1 kg BMI (Calculated): 25.31   Intake/Output last 2 shifts:  08/23 0701 - 08/24 0700 In: 200.2 [IV Piggyback:200.2] Out: 1100 [Urine:700; Drains:400]   Physical Exam:  Constitutional: alert, cooperative and no distress HENT: normocephalic without obvious abnormality, tracheostomy present Eyes: PERRL, EOM's grossly intact and symmetric  Respiratory: On ventilator Cardiovascular: regular rate and sinus rhythm  Gastrointestinal: soft, still with left > right sided abdominal tenderness, and non-distended, no rebound/guarding. PEG in place Genitourinary: Foley in palce   Labs:  CBC Latest Ref Rng & Units 08/02/2020 08/01/2020 08/31/2016  WBC 4.0 - 10.5 K/uL 17.6(H) 21.6(H) 17.0(H)  Hemoglobin 12.0 - 15.0 g/dL 09/02/2016 32.3 55.7  Hematocrit 36 - 46 % 40.1 44.7 38.1  Platelets 150 - 400 K/uL 280 298 415   CMP Latest Ref Rng & Units 08/02/2020 08/01/2020 08/31/2016  Glucose 70 - 99 mg/dL 83 09/02/2016) 025(K)  BUN 6 - 20 mg/dL 18 20 20   Creatinine 0.44 - 1.00 mg/dL  270(W) ) 2.37(S)  Sodium 135 - 145 mmol/L 142 139 138  Potassium 3.5 - 5.1 mmol/L 3.7 4.0 4.3  Chloride 98 - 111 mmol/L 106 102 105  CO2 22 - 32 mmol/L 24 25 29   Calcium 8.9 - 10.3 mg/dL 9.3 9.6 9.2  Total Protein 6.5 - 8.1 g/dL - 8.0 -  Total Bilirubin 0.3 - 1.2 mg/dL - 0.7 -  Alkaline Phos 38 - 126 U/L - 95 -  AST 15 - 41 U/L - 22 -  ALT 0 - 44 U/L - 17 -    Imaging studies: No new pertinent imaging studies   Assessment/Plan: (ICD-10's: K17.92) 49 y.o. female with acute uncomplicated diverticulitis, complicated by pertinent comorbidities including ALS   - Continue NPO for now; hold tube feedings until pain clinically improved   - Continue IV Abx (Zosyn)              - Monitor leukocytosis; fever curve             - Monitor abdominal examination             - Pain control prn; antiemetics prn             - No emergent surgical intervention             - Agree palliative consultation              - Further management per primary service; we will follow   All of the above findings  and recommendations were discussed with the patient and the medical team  Thank you for the opportunity to participate in this patient's care.   -- Lynden Oxford, PA-C [facility] 08/03/2020, 7:35 AM [phone] M-F: 7am - 4pm

## 2020-08-04 ENCOUNTER — Inpatient Hospital Stay: Payer: Medicare HMO

## 2020-08-04 DIAGNOSIS — A419 Sepsis, unspecified organism: Secondary | ICD-10-CM

## 2020-08-04 LAB — BASIC METABOLIC PANEL
Anion gap: 19 — ABNORMAL HIGH (ref 5–15)
BUN: 19 mg/dL (ref 6–20)
CO2: 16 mmol/L — ABNORMAL LOW (ref 22–32)
Calcium: 8.6 mg/dL — ABNORMAL LOW (ref 8.9–10.3)
Chloride: 111 mmol/L (ref 98–111)
Creatinine, Ser: 0.45 mg/dL (ref 0.44–1.00)
GFR calc Af Amer: >60 mL/min (ref >60)
GFR calc non Af Amer: >60 mL/min (ref >60)
Glucose, Bld: 71 mg/dL (ref 70–99)
Potassium: 3.2 mmol/L — ABNORMAL LOW (ref 3.5–5.1)
Sodium: 146 mmol/L — ABNORMAL HIGH (ref 135–145)

## 2020-08-04 LAB — URINALYSIS, COMPLETE (UACMP) WITH MICROSCOPIC
Bilirubin Urine: NEGATIVE
Glucose, UA: NEGATIVE mg/dL
Ketones, ur: 80 mg/dL — AB
Leukocytes,Ua: NEGATIVE
Nitrite: NEGATIVE
Protein, ur: 100 mg/dL — AB
RBC / HPF: >50 RBC/hpf — ABNORMAL HIGH (ref 0–5)
Specific Gravity, Urine: 1.021 (ref 1.005–1.030)
pH: 5 (ref 5.0–8.0)

## 2020-08-04 LAB — CBC
HCT: 40.2 % (ref 36.0–46.0)
Hemoglobin: 12.6 g/dL (ref 12.0–15.0)
MCH: 32.1 pg (ref 26.0–34.0)
MCHC: 31.3 g/dL (ref 30.0–36.0)
MCV: 102.6 fL — ABNORMAL HIGH (ref 80.0–100.0)
Platelets: 298 10*3/uL (ref 150–400)
RBC: 3.92 MIL/uL (ref 3.87–5.11)
RDW: 14.6 % (ref 11.5–15.5)
WBC: 19.1 10*3/uL — ABNORMAL HIGH (ref 4.0–10.5)
nRBC: 0 % (ref 0.0–0.2)

## 2020-08-04 LAB — MAGNESIUM: Magnesium: 2.2 mg/dL (ref 1.7–2.4)

## 2020-08-04 LAB — PHOSPHORUS: Phosphorus: 2.9 mg/dL (ref 2.5–4.6)

## 2020-08-04 MED ORDER — MONTELUKAST SODIUM 10 MG PO TABS: 10.0000 mg | ORAL_TABLET | Freq: Every day | ORAL | Status: DC

## 2020-08-04 MED ORDER — BACLOFEN 10 MG PO TABS
20.0000 mg | ORAL_TABLET | Freq: Four times a day (QID) | ORAL | Status: DC
  Administered 2020-08-04 (×3): 20 mg
  Filled 2020-08-04 (×7): qty 2

## 2020-08-04 MED ORDER — FAMOTIDINE 40 MG/5ML PO SUSR
20.0000 mg | Freq: Two times a day (BID) | ORAL | Status: DC
  Administered 2020-08-04 – 2020-08-12 (×16): 20 mg
  Filled 2020-08-04 (×20): qty 2.5

## 2020-08-04 MED ORDER — FLUTICASONE PROPIONATE 50 MCG/ACT NA SUSP
1.0000 | Freq: Every day | NASAL | Status: DC
  Administered 2020-08-04 – 2020-08-13 (×10): 1 via NASAL
  Filled 2020-08-04: qty 16

## 2020-08-04 MED ORDER — BUPROPION HCL 100 MG PO TABS
100.0000 mg | ORAL_TABLET | Freq: Three times a day (TID) | ORAL | Status: DC
  Administered 2020-08-04 – 2020-08-13 (×27): 100 mg
  Filled 2020-08-04 (×30): qty 1

## 2020-08-04 MED ORDER — PREGABALIN 25 MG PO CAPS
25.0000 mg | ORAL_CAPSULE | Freq: Every day | ORAL | Status: DC | PRN
  Filled 2020-08-04: qty 1

## 2020-08-04 MED ORDER — POTASSIUM CHLORIDE 10 MEQ/50ML IV SOLN
10.0000 meq | INTRAVENOUS | Status: AC
  Administered 2020-08-04 (×6): 10 meq via INTRAVENOUS
  Filled 2020-08-04 (×6): qty 50

## 2020-08-04 MED ORDER — BACLOFEN 10 MG PO TABS: 20.0000 mg | ORAL_TABLET | Freq: Four times a day (QID) | ORAL | Status: DC

## 2020-08-04 MED ORDER — MONTELUKAST SODIUM 10 MG PO TABS
10.0000 mg | ORAL_TABLET | Freq: Every day | ORAL | Status: DC
  Administered 2020-08-04 – 2020-08-12 (×9): 10 mg
  Filled 2020-08-04 (×9): qty 1

## 2020-08-04 MED ORDER — POLYETHYLENE GLYCOL 3350 17 G PO PACK: 17.0000 g | PACK | Freq: Every day | ORAL | Status: DC | PRN

## 2020-08-04 MED ORDER — PREGABALIN 50 MG PO CAPS
100.0000 mg | ORAL_CAPSULE | Freq: Two times a day (BID) | ORAL | Status: DC
  Administered 2020-08-04 – 2020-08-13 (×18): 100 mg
  Filled 2020-08-04 (×18): qty 2

## 2020-08-04 MED ORDER — MELATONIN 5 MG PO TABS
7.5000 mg | ORAL_TABLET | Freq: Every day | ORAL | Status: DC
  Administered 2020-08-04 – 2020-08-12 (×8): 7.5 mg
  Filled 2020-08-04 (×9): qty 2

## 2020-08-04 MED ORDER — CITALOPRAM HYDROBROMIDE 20 MG PO TABS
20.0000 mg | ORAL_TABLET | Freq: Every day | ORAL | Status: DC
  Administered 2020-08-04 – 2020-08-13 (×9): 20 mg
  Filled 2020-08-04 (×9): qty 1

## 2020-08-04 MED ORDER — LACTATED RINGERS IV BOLUS
1000.0000 mL | Freq: Once | INTRAVENOUS | Status: AC
  Administered 2020-08-04: 1000 mL via INTRAVENOUS

## 2020-08-04 MED ORDER — POLYETHYLENE GLYCOL 3350 17 G PO PACK
17.0000 g | PACK | Freq: Every day | ORAL | Status: DC | PRN
  Administered 2020-08-08: 17 g
  Filled 2020-08-04: qty 1

## 2020-08-04 MED ORDER — CITALOPRAM HYDROBROMIDE 20 MG PO TABS: 20.0000 mg | ORAL_TABLET | Freq: Every day | ORAL | Status: DC

## 2020-08-04 MED ORDER — NAPHAZOLINE-GLYCERIN 0.012-0.2 % OP SOLN
1.0000 [drp] | Freq: Four times a day (QID) | OPHTHALMIC | Status: DC | PRN
  Filled 2020-08-04: qty 15

## 2020-08-04 MED ORDER — CHLORHEXIDINE GLUCONATE CLOTH 2 % EX PADS
6.0000 | MEDICATED_PAD | Freq: Every day | CUTANEOUS | Status: DC
  Administered 2020-08-04 – 2020-08-11 (×8): 6 via TOPICAL

## 2020-08-04 MED ORDER — MELATONIN 1 MG/ML PO LIQD: 8.0000 mL | Freq: Every day | ORAL | Status: DC

## 2020-08-04 NOTE — Progress Notes (Signed)
Name: Brenda Anderson MRN: [id number] DOB: August 17, 1971     CONSULTATION DATE: 08/04/2020  REFERRING MD : Scotty Court  CHIEF COMPLAINT: Abd pain  HISTORY OF PRESENT ILLNESS: Remains sepsis with acute diverticulitis Chronic vent +Tearful gen surgery following +abd pain +nausea   PCCM called to admit patient with acute Diverticulitis and sepsis  Chronic Trach and Vent dependant, Dx of ALS   PAST MEDICAL HISTORY :   has a past medical history of ALS (amyotrophic lateral sclerosis) (HCC), PEG (percutaneous endoscopic gastrostomy) status (HCC), and Tracheostomy dependence (HCC).  has no past surgical history on file. Prior to Admission medications   Medication Sig Start Date End Date Taking? Authorizing Provider  ascorbic acid (VITAMIN C) 250 MG tablet Take 250 mg by mouth 2 (two) times daily.    [provider]  atenolol (TENORMIN) 25 MG tablet Place 3 tablets into feeding tube daily. 02/24/16   [provider]  buPROPion (WELLBUTRIN) 100 MG tablet Place 1 tablet into feeding tube 3 (three) times daily. 02/24/16   [provider]  Cholecalciferol (VITAMIN D3) 1000 units CAPS Place 1 capsule into feeding tube 2 (two) times daily.  02/24/16   [provider]  citalopram (CELEXA) 20 MG tablet Take 20 mg by mouth daily.    [provider]  clonazePAM (KLONOPIN) 0.5 MG tablet Place 0.25 mg into feeding tube 2 (two) times daily as needed.  02/24/16   [provider]  Ferrous Fumarate (FERROCITE) 324 (106 Fe) MG TABS tablet Take 1 tablet by mouth 2 (two) times daily.    [provider]  loratadine (CLARITIN) 10 MG tablet Place 1 tablet into feeding tube daily. 02/24/16   [provider]  Melatonin 1 MG/ML LIQD Place 8 mLs into feeding tube at bedtime. 02/24/16   [provider]  polyethylene glycol (MIRALAX / GLYCOLAX) packet Take 17 g by mouth daily as needed.    [provider]  pregabalin (LYRICA) 50 MG  capsule Take 2 capsules by mouth daily. 02/24/16   [provider]  ranitidine (ZANTAC) 15 MG/ML syrup Place 10 mLs into feeding tube daily. 02/24/16   [provider]   Allergies  Allergen Reactions  . Ciprofloxacin Nausea Only and Nausea And Vomiting  . Other     purfume  . Ultram [Tramadol Hcl]     Review of Systems: Positives in BOLD: Gen: Denies fever, chills, weight change, fatigue, night sweats HEENT: Denies blurred vision, double vision, hearing loss, tinnitus, sinus congestion, rhinorrhea, sore throat, neck stiffness, dysphagia PULM: Denies shortness of breath, cough, sputum production, hemoptysis, wheezing CV: Denies chest pain, edema, orthopnea, paroxysmal nocturnal dyspnea, palpitations GI: Denies +abdominal pain, +nausea, vomiting, diarrhea, hematochezia, melena, constipation, change in bowel habits GU: Denies dysuria, hematuria, polyuria, oliguria, urethral discharge Endocrine: Denies hot or cold intolerance, polyuria, polyphagia or appetite change Derm: Denies rash, dry skin, scaling or peeling skin change Heme: Denies easy bruising, bleeding, bleeding gums Neuro: Denies headache, numbness, weakness, slurred speech, loss of memory or consciousness      VITAL SIGNS: Temp:  [98.1 F (36.7 C)-98.5 F (36.9 C)] 98.1 F (36.7 C) (08/25 0000) Pulse Rate:  [95-117] 107 (08/25 0400) Resp:  [13-24] 14 (08/25 0600) BP: (97-121)/(53-67) 116/63 (08/25 0600) SpO2:  [94 %-100 %] 99 % (08/25 0600) FiO2 (%):  [28 %] 28 % (08/25 0600)     SpO2: 99 % O2 Flow Rate (L/min): 2 L/min FiO2 (%): 28 %    Physical Examination:   GENERAL:Chronically  ill appearing female, laying in bed, on chronic vent via Trach, in NAD  NECK: Neck supple, Tracheostomy in place PULMONARY: Clear to auscultation bilaterally, no wheezing or rales, even CARDIOVASCULAR: Tachycardia, regular rhythm, s1s2, no M/R/G GASTROINTESTINAL: Soft, tenderness to LLQ, no guarding or rebound  tenderness, BS+ x4 MUSCULOSKELETAL: No swelling, clubbing, or edema NEUROLOGIC: Quadriplegia due to ALS SKIN: Warm and dry.  No obvious rashes, lesions, or ulcerations ALL OTHER ROS ARE NEGATIVE       IMAGING    No results found.     ASSESSMENT AND PLAN   Acute SEPSIS due to acute DIVERTICULITIS SEPSIS PROTOCOL LA 1.5 -Monitor fever curve Trend WBC's & Procalcitonin -Follow cultures as above -Continue Zosyn -General Surgery following, appreciate input ~ No need for surgical intervention at this time per Surgery -Pain control  -Prn Antiemetics -Follow abdominal exam -Keep NPO per PEG until pain clinically improved -Pt received fleets enema 08/03/20, resulted in large BM -Will check CXR & UA due to worsening Leukocytosis -General Surgery recommends considering repeating CT Abdomen/Pelvis tomorrow if she continues to have persistent Leukocytosis   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 -Vent support -Wean FiO2 & PEEP as tolerated to maintain O2 sats >92% -Follow intermittent CXR & ABG as needed -Prn Bronchodilators   ELECTROLYTES Hypokalemia -Monitor I&O's / urinary output -Follow BMP -Ensure adequate renal perfusion -Avoid nephrotoxic agents as able -Replace electrolytes as indicated -Pharmacy consulted for assistance in electrolyte replacement         [facility] DISPOSITION: ICU GOALS OF CARE: FULL CODE VTE PROPHYLAXIS: SQ LOVENOX SUP: PEPCID CONSULT: GENERAL SURGERY, PALLIATIVE CARE UPDATES: UPDATED PT AT BEDSIDE 08/04/20   Harlon Ditty, AGACNP-BC [facility] Pager: [phone]

## 2020-08-04 NOTE — Progress Notes (Signed)
Pt has remained Alert throughout shift which has been from 3-7p. Pt still on Home V-Trach. Pts HR went up in the 130's since 9am. Gave an 1L LR bolus per MD. After bolus pt came down into the 110's. Pts bp has become soft no intervention needed as of yet. Pt sleeping when I awaken her her bp goes WNL. Pt PEG tube clamped due to meds being given. Removed pts Right eye contact because it looked uncomfortable and was rolling up. I called pts mother to ok before I removed she advised me I could discard the contact and she will bring another one up probably tomorrow. I ordered lubricating eye drops per md, pts eyes red and dry. Possibly starting nutrition tomorrow. Will continue to monitor.

## 2020-08-04 NOTE — Progress Notes (Signed)
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 Patient with PEG and trach. PEG tube currently on gravity, no meds/feeding. NPO. Pharmacy to manage electrolytes.  Goal of Therapy:  Electrolytes WNL  Plan:  Potassium low again this morning. Holding tube feeds, hypokalemia likely a result. Will replace with potassium 10 mEq IV x 6 runs. All electrolytes with morning labs.  Pricilla Riffle ,PharmD Clinical Pharmacist 08/04/2020 3:42 PM

## 2020-08-04 NOTE — Progress Notes (Signed)
[facility] SURGICAL PROGRESS NOTE (cpt [phone])  Hospital Day(s): 3.   Interval History: Patient seen and examined, no acute events or new complaints overnight. Patient non-verbal but shakes head some to participate in history. Patient resting comfortably. She still shakes head "yes" to abdominal pain, she feels this may be slightly improved. Leukocytosis slightly worse today to 19.1K but no fevers recorded. Renal function remains in normal limits with sCr - 0.45 and with good UO at 1.1L. She does have hypokalemia to 3.2 this morning. She responded well to fleets enema. PEG has been to gravity and tube feedings held for bowel rest.   Review of Systems:  Unable to reliably preform secondary to nonverbal status  Vital signs in last 24 hours: [min-max] current  Temp:  [98.1 F (36.7 C)-98.5 F (36.9 C)] 98.1 F (36.7 C) (08/25 0000) Pulse Rate:  [95-117] 107 (08/25 0400) Resp:  [13-24] 14 (08/25 0600) BP: (97-121)/(53-67) 116/63 (08/25 0600) SpO2:  [94 %-100 %] 99 % (08/25 0600) FiO2 (%):  [28 %] 28 % (08/25 0600)     Height: 5\' 6"  (167.6 cm) Weight: 71.1 kg BMI (Calculated): 25.31   Intake/Output last 2 shifts:  08/24 0701 - 08/25 0700 In: 499.2 [IV Piggyback:499.2] Out: 1250 [Urine:1150; Drains:100]   Physical Exam:  Constitutional: alert, cooperative and no distress HENT: normocephalic without obvious abnormality, tracheostomy present Respiratory: On ventilator Cardiovascular: tachycardic and sinus rhythm  Gastrointestinal: soft, still with mostly lower abdominal pain abdominal tenderness (left > right), and non-distended, no rebound/guarding. PEG in place Genitourinary: Foley in palce   Labs:  CBC Latest Ref Rng & Units 08/04/2020 08/03/2020 08/02/2020  WBC 4.0 - 10.5 K/uL 19.1(H) 17.9(H) 17.6(H)  Hemoglobin 12.0 - 15.0 g/dL 08/04/2020 60.4 54.0  Hematocrit 36 - 46 % 40.2 40.8 40.1  Platelets 150 - 400 K/uL 298 299 280   CMP Latest Ref Rng & Units 08/04/2020 08/03/2020  08/03/2020  Glucose 70 - 99 mg/dL 71 - 08/05/2020)  BUN 6 - 20 mg/dL 19 - 19(J)  Creatinine 0.44 - 1.00 mg/dL 47(W - 2.95  Sodium 6.21 - 145 mmol/L 146(H) - 143  Potassium 3.5 - 5.1 mmol/L 3.2(L) 3.9 2.7(LL)  Chloride 98 - 111 mmol/L 111 - 106  CO2 22 - 32 mmol/L 16(L) - 17(L)  Calcium 8.9 - 10.3 mg/dL 308) - 9.2  Total Protein 6.5 - 8.1 g/dL - - -  Total Bilirubin 0.3 - 1.2 mg/dL - - -  Alkaline Phos 38 - 126 U/L - - -  AST 15 - 41 U/L - - -  ALT 0 - 44 U/L - - -     Imaging studies: No new pertinent imaging studies   Assessment/Plan: (ICD-10's: K24.92) 49 y.o. female with worsening leukocytosis and unchanged abdominal pain attributable to acute uncomplicated diverticulitis, complicated by pertinent comorbidities includingALS   - Given the difficulty in obtaining reliable subjective input from patient, we may need to consider repeating CT Abdomen/Pelvis tomorrow (08/26) if she continues to have persistent leukocytosis. Would also recommend evaluating for additional sources of leukocytosis (ex: UTI, PNA)  - Continue NPO for now; hold tube feedings until pain clinically improved              - Continue IV Abx (Zosyn) - Monitor leukocytosis; fever curve - Monitor abdominal examination  - Replete electrolytes  - Pain control prn; antiemetics prn - No emergent surgical intervention - Appreciate palliative consultation; full scope - Further management per primary service; we will follow  All  of the above findings and recommendations were discussed with the patient, and the medical team.  -- Lynden Oxford, PA-C [facility] 08/04/2020, 7:49 AM [phone] M-F: 7am - 4pm

## 2020-08-05 ENCOUNTER — Inpatient Hospital Stay: Payer: Medicare HMO

## 2020-08-05 ENCOUNTER — Encounter: Payer: Self-pay | Admitting: Internal Medicine

## 2020-08-05 DIAGNOSIS — G1221 Amyotrophic lateral sclerosis: Secondary | ICD-10-CM

## 2020-08-05 DIAGNOSIS — Z7189 Other specified counseling: Secondary | ICD-10-CM

## 2020-08-05 DIAGNOSIS — Z515 Encounter for palliative care: Secondary | ICD-10-CM

## 2020-08-05 DIAGNOSIS — K5792 Diverticulitis of intestine, part unspecified, without perforation or abscess without bleeding: Secondary | ICD-10-CM

## 2020-08-05 LAB — CBC
HCT: 39.4 % (ref 36.0–46.0)
Hemoglobin: 12.2 g/dL (ref 12.0–15.0)
MCH: 31.2 pg (ref 26.0–34.0)
MCHC: 31 g/dL (ref 30.0–36.0)
MCV: 100.8 fL — ABNORMAL HIGH (ref 80.0–100.0)
Platelets: 275 10*3/uL (ref 150–400)
RBC: 3.91 MIL/uL (ref 3.87–5.11)
RDW: 15 % (ref 11.5–15.5)
WBC: 12.9 10*3/uL — ABNORMAL HIGH (ref 4.0–10.5)
nRBC: 0 % (ref 0.0–0.2)

## 2020-08-05 LAB — GLUCOSE, CAPILLARY
Glucose-Capillary: 49 mg/dL — ABNORMAL LOW (ref 70–99)
Glucose-Capillary: 49 mg/dL — ABNORMAL LOW (ref 70–99)
Glucose-Capillary: 175 mg/dL — ABNORMAL HIGH (ref 70–99)
Glucose-Capillary: 94 mg/dL (ref 70–99)
Glucose-Capillary: 170 mg/dL — ABNORMAL HIGH (ref 70–99)
Glucose-Capillary: 175 mg/dL — ABNORMAL HIGH (ref 70–99)
Glucose-Capillary: 161 mg/dL — ABNORMAL HIGH (ref 70–99)

## 2020-08-05 LAB — LACTIC ACID, PLASMA
Lactic Acid, Venous: 0.8 mmol/L (ref 0.5–1.9)
Lactic Acid, Venous: 0.9 mmol/L (ref 0.5–1.9)

## 2020-08-05 LAB — BASIC METABOLIC PANEL
Anion gap: 12 (ref 5–15)
BUN: 13 mg/dL (ref 6–20)
CO2: 17 mmol/L — ABNORMAL LOW (ref 22–32)
Calcium: 9.1 mg/dL (ref 8.9–10.3)
Chloride: 119 mmol/L — ABNORMAL HIGH (ref 98–111)
Creatinine, Ser: 0.46 mg/dL (ref 0.44–1.00)
GFR calc Af Amer: >60 mL/min (ref >60)
GFR calc non Af Amer: >60 mL/min (ref >60)
Glucose, Bld: 58 mg/dL — ABNORMAL LOW (ref 70–99)
Potassium: 3 mmol/L — ABNORMAL LOW (ref 3.5–5.1)
Sodium: 148 mmol/L — ABNORMAL HIGH (ref 135–145)

## 2020-08-05 LAB — MAGNESIUM: Magnesium: 2.2 mg/dL (ref 1.7–2.4)

## 2020-08-05 LAB — PHOSPHORUS: Phosphorus: 1.3 mg/dL — ABNORMAL LOW (ref 2.5–4.6)

## 2020-08-05 MED ORDER — DEXTROSE 5 % IV SOLN: INTRAVENOUS | Status: DC

## 2020-08-05 MED ORDER — DEXTROSE 50 % IV SOLN
25.0000 g | Freq: Once | INTRAVENOUS | Status: AC
  Administered 2020-08-05: 25 g via INTRAVENOUS
  Filled 2020-08-05: qty 50

## 2020-08-05 MED ORDER — IOHEXOL 9 MG/ML PO SOLN
1000.0000 mL | ORAL | Status: AC
  Administered 2020-08-05 (×2): 500 mL via ORAL

## 2020-08-05 MED ORDER — DEXTROSE IN LACTATED RINGERS 5 % IV SOLN: INTRAVENOUS | Status: AC

## 2020-08-05 MED ORDER — POTASSIUM PHOSPHATES 15 MMOLE/5ML IV SOLN
30.0000 mmol | Freq: Once | INTRAVENOUS | Status: AC
  Administered 2020-08-05: 30 mmol via INTRAVENOUS
  Filled 2020-08-05: qty 10

## 2020-08-05 MED ORDER — BACLOFEN 10 MG PO TABS
10.0000 mg | ORAL_TABLET | Freq: Three times a day (TID) | ORAL | Status: DC
  Administered 2020-08-05 – 2020-08-13 (×24): 10 mg
  Filled 2020-08-05 (×27): qty 1

## 2020-08-05 MED ORDER — IOHEXOL 300 MG/ML  SOLN
100.0000 mL | Freq: Once | INTRAMUSCULAR | Status: AC | PRN
  Administered 2020-08-05: 100 mL via INTRAVENOUS

## 2020-08-05 MED ORDER — POTASSIUM CHLORIDE 20 MEQ PO PACK
40.0000 meq | PACK | Freq: Once | ORAL | Status: AC
  Administered 2020-08-05: 40 meq
  Filled 2020-08-05: qty 2

## 2020-08-05 NOTE — Progress Notes (Signed)
Name: Brenda Anderson MRN: [id number] DOB: 31-Dec-1970     CONSULTATION DATE: 08/05/2020  REFERRING MD : Scotty Court  CHIEF COMPLAINT: Abd pain  HISTORY OF PRESENT ILLNESS: Remains sepsis with acute diverticulitis Chronic vent +Tearful gen surgery following +abd pain +nausea   PCCM called to admit patient with acute Diverticulitis and sepsis  Chronic Trach and Vent dependant, Dx of ALS   PAST MEDICAL HISTORY :   has a past medical history of ALS (amyotrophic lateral sclerosis) (HCC), PEG (percutaneous endoscopic gastrostomy) status (HCC), and Tracheostomy dependence (HCC).  has no past surgical history on file. Prior to Admission medications   Medication Sig Start Date End Date Taking? Authorizing Provider  ascorbic acid (VITAMIN C) 250 MG tablet Take 250 mg by mouth 2 (two) times daily.    [provider]  atenolol (TENORMIN) 25 MG tablet Place 3 tablets into feeding tube daily. 02/24/16   [provider]  buPROPion (WELLBUTRIN) 100 MG tablet Place 1 tablet into feeding tube 3 (three) times daily. 02/24/16   [provider]  Cholecalciferol (VITAMIN D3) 1000 units CAPS Place 1 capsule into feeding tube 2 (two) times daily.  02/24/16   [provider]  citalopram (CELEXA) 20 MG tablet Take 20 mg by mouth daily.    [provider]  clonazePAM (KLONOPIN) 0.5 MG tablet Place 0.25 mg into feeding tube 2 (two) times daily as needed.  02/24/16   [provider]  Ferrous Fumarate (FERROCITE) 324 (106 Fe) MG TABS tablet Take 1 tablet by mouth 2 (two) times daily.    [provider]  loratadine (CLARITIN) 10 MG tablet Place 1 tablet into feeding tube daily. 02/24/16   [provider]  Melatonin 1 MG/ML LIQD Place 8 mLs into feeding tube at bedtime. 02/24/16   [provider]  polyethylene glycol (MIRALAX / GLYCOLAX) packet Take 17 g by mouth daily as needed.    [provider]  pregabalin (LYRICA) 50 MG  capsule Take 2 capsules by mouth daily. 02/24/16   [provider]  ranitidine (ZANTAC) 15 MG/ML syrup Place 10 mLs into feeding tube daily. 02/24/16   [provider]   Allergies  Allergen Reactions  . Ciprofloxacin Nausea Only and Nausea And Vomiting  . Other     purfume  . Ultram [Tramadol Hcl]     Review of Systems: Unable to reliably assess due to pt NON-VERBAL status No acute events overnight reported by Nursing Afebrile, VSS On home chronic vent  VITAL SIGNS: Temp:  [97.8 F (36.6 C)-98.3 F (36.8 C)] 97.9 F (36.6 C) (08/26 0010) Pulse Rate:  [77-114] 84 (08/26 0630) Resp:  [12-22] 20 (08/26 0630) BP: (79-115)/(50-69) 94/56 (08/26 0630) SpO2:  [97 %-99 %] 99 % (08/26 0630) FiO2 (%):  [28 %] 28 % (08/26 0306)     SpO2: 99 % O2 Flow Rate (L/min): 2 L/min FiO2 (%): 28 %    Physical Examination:   GENERAL: Chronically ill appearing female, laying in bed, on chronic vent via Trach, in NAD   NECK: Neck supple, Tracheostomy in place  PULMONARY: Clear to auscultation bilaterally, no wheezing or rales, even CARDIOVASCULAR:Regular rate & rhythm, s1s2, no M/R/G  GASTROINTESTINAL: Soft, tenderness to LLQ, no guarding or rebound tenderness, BS+ x4  MUSCULOSKELETAL: No swelling, no clubbing, no edema NEUROLOGIC: 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  No obvious rashes, lesions, or ulcerations   ALL OTHER ROS ARE NEGATIVE       IMAGING  DG Chest Port 1 View  Result Date: 08/04/2020 CLINICAL DATA:  Leukocytosis. EXAM: PORTABLE CHEST 1 VIEW COMPARISON:  Chest radiograph 08/31/2016. Lung bases from abdominal CT 08/01/2020 FINDINGS: Tracheostomy tube tip at the thoracic inlet. Right upper extremity PICC tip in the lower SVC. Chronic elevation of right hemidiaphragm. Adjacent compressive atelectasis/scarring at the right lung base. There is mild biapical pleuroparenchymal scarring. No acute or confluent airspace disease. No pleural effusion or  pneumothorax. No acute osseous abnormalities are seen. IMPRESSION: 1. Chronic elevation of right hemidiaphragm with chronic atelectasis or scarring at the right lung base. 2. Tracheostomy tube tip at the thoracic inlet. 3. Right upper extremity PICC tip in the lower SVC. Electronically Signed   By: Narda Rutherford M.D.   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 -Vent support -Wean FiO2 & PEEP as tolerated to maintain O2 sats >92% -Follow intermittent CXR & ABG as needed -Prn Bronchodilators    ELECTROLYTES Hypokalemia Hypernatremia -Monitor I&O's / urinary output -Follow BMP -Ensure adequate renal perfusion -Avoid nephrotoxic agents as able -Replace electrolytes as indicated -Pharmacy consulted for assistance in electrolyte replacement -Will place on D5W @ 50 ml/hr since remains NPO per PEG tube at this time    Hypoglycemia -CBG's -Follow ICU Hypo/hyperglycemia protocol -Place on D5W @ 50 ml/hr       [facility] PRACTICES DISPOSITION: ICU GOALS OF CARE: FULL CODE VTE PROPHYLAXIS: SQ LOVENOX SUP: PEPCID CONSULT: GENERAL SURGERY, PALLIATIVE CARE UPDATES: UPDATED PT AT BEDSIDE 08/05/20   Harlon Ditty, AGACNP-BC [facility] Pager: [phone]

## 2020-08-05 NOTE — Progress Notes (Signed)
Called and updated/followed up with  pt's mother Brenda Anderson via telephone of CT Abdomen results as previously discussed with her by General Surgery.  Discussed that current plan is for conservative management with close assessment of clinical status since leukocytosis improving, lactic acid negative and VSS.  If pt were to worsen then would likely need emergency surgery.  Also updated her on plan for initiation of TPN given lack of nutrition due to NPO status, of which she is in agreement with proceeding. PICC line already in place.  All questions answered.  She is very appreciative of update.      Harlon Ditty, AGACNP-BC [facility] Pager: [phone]

## 2020-08-05 NOTE — Progress Notes (Signed)
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 Patient with PEG and trach. PEG tube currently on gravity, no tube feeds. Pharmacy to manage electrolytes.  Goal of Therapy:  Electrolytes WNL  Plan:  Potassium remains low, phosphorus now low as well. Holding tube feeds. Will give potassium phos 30 mmol IV x 1 as well as potassium 40 mEq per tube for one dose. Hopeful to restart tube feeds pending repeat CT abdomen/pelvis. All electrolytes with morning labs.  Pricilla Riffle ,PharmD Clinical Pharmacist 08/05/2020 9:37 AM

## 2020-08-05 NOTE — Progress Notes (Signed)
Patient lab glucose 58. Recheck CBG 49. Order entered for D50 per standing order for hypoglycemia. Will continue to monitor and recheck CBG.

## 2020-08-05 NOTE — Progress Notes (Signed)
Pt transported to CT with home vent with no complications.

## 2020-08-05 NOTE — Progress Notes (Signed)
SLP Cancellation Note  Patient Details Name: Brenda Anderson MRN: [id number] DOB: 05/09/1971   Cancelled treatment:       Reason Eval/Treat Not Completed:  (chart reviewed; consulted MD and NSG re: order). Per chart review and MD/NSG report, pt has ALS w/ tracheostomy and PEG tube for nutrition/hydration at baseline. She is able to mouth somewhat but unable to phonate/verbalize and uses a personalized/specialized augmentative communication system (dependent on eye gaze and computer) at home for communicating w/ others. Such a system is not available at the acute hospital setting, however, if able/possible, family could bring in pt's from home for pt to use. Pt is unable to use UEs to use finger pointing for standard communication boards. Encourage careful attention of pt's mouthing and any eye gaze "gesture" she might can give during communication interactions.      Jerilynn Som, MS, CCC-SLP [facility] 08/05/2020, 10:50 AM

## 2020-08-05 NOTE — Progress Notes (Addendum)
Progress Note CT Abdomen/Pelvis (08/05/2020) personally reviewed by myself and Dr Everlene Farrier concerning for increasing pneumoperitoneum, etiology unclear but potentially from distal colon, and radiologist report reviewed:  IMPRESSION: 1. There is small volume pneumoperitoneum in the ventral abdomen, with small loculations of air in the small bowel mesentery and sigmoid mesocolon. Findings are consistent with bowel perforation although of uncertain etiology, possibly related to mechanical disimpaction or perforated stercoral ulceration given previously noted large stool balls in the rectum. 2. There is wall thickening of the distal sigmoid colon and rectum suggestive of nonspecific infectious or inflammatory proctocolitis. 3. Severe pancolonic diverticulosis without evidence of acute diverticulitis. 4. Small right pleural effusion and associated atelectasis or consolidation. 5. Nonobstructive right nephrolithiasis. 6. Severe sarcopenia. 7. Aortic Atherosclerosis (ICD10-I70.0).  Dr Everlene Farrier discussed these findings with the reading radiologist via telephone as well. Patient continues to be resting comfortably in bed and her abdominal examination remains reassuring. Her hemodynamics have been stable and her leukocytosis improved this morning.   I spoke on the phone with the patient's mother Particia Nearing) and updated her on the findings this morning and our thought process. At this point, she is clinically and hemodynamically stable and we feel it will be in her best interest if we attempt to manage this conservatively with the understanding that if she clinically deteriorates then she will likely require emergency surgery which will result potentially in colostomy. She seemed understanding of this. Additionally, she will be best served with initiation of TPN at this point as she will be NPO for a prolonged period.  All questions and concerns were answered.   In summary... -- Continue NPO; initiate TPN;  hold tube feedings -- Serial abdominal examinations -- Continue IV Abx  -- Will check lactic acid -- Monitor leukocytosis -- Will need re-imaging in 48-72 hours pending clinical condition -- No emergent intervention  Above findings and plan discussed with Dr Everlene Farrier.   -- Lynden Oxford, PA-C [facility] 08/05/2020, 12:16 PM [phone] M-F: 7am - 4pm

## 2020-08-05 NOTE — Progress Notes (Signed)
Daily Progress Note   Patient Name: Brenda Anderson       Date: 08/05/2020 DOB: [birth year]  Age: 49 y.o. MRN#: [id number] Attending Physician: Flora Lipps, MD Primary Care Physician: System, Pcp Not In Admit Date: 08/01/2020  Reason for Consultation/Follow-up: Establishing goals of care  Subjective: Patient with hypotension this morning, drowsy. Noted plans for repeat CT and hopeful to restart tube feedings if CT does not show worsening abdomen.   Review of Systems  Unable to perform ROS: Mental status change    Length of Stay: 4  Current Medications: Scheduled Meds:  . baclofen  10 mg Per Tube TID  . buPROPion  100 mg Per Tube TID  . chlorhexidine gluconate (MEDLINE KIT)  15 mL Mouth Rinse BID  . Chlorhexidine Gluconate Cloth  6 each Topical Daily  . citalopram  20 mg Per Tube Daily  . enoxaparin (LOVENOX) injection  40 mg Subcutaneous Q24H  . famotidine  20 mg Per Tube BID  . fluticasone  1 spray Each Nare Daily  . mouth rinse  15 mL Mouth Rinse 10 times per day  . melatonin  7.5 mg Per Tube QHS  . montelukast  10 mg Per Tube QHS  .  morphine injection  2 mg Intravenous Once  . pregabalin  100 mg Per Tube BID  . sodium chloride flush  10-40 mL Intracatheter Q12H    Continuous Infusions: . dextrose 50 mL/hr at 08/05/20 0904  . piperacillin-tazobactam (ZOSYN)  IV 3.375 g (08/05/20 0925)  . potassium PHOSPHATE IVPB (in mmol)      PRN Meds: HYDROcodone-acetaminophen, iohexol, morphine injection, naphazoline-glycerin, ondansetron (ZOFRAN) IV, polyethylene glycol, pregabalin, promethazine, sodium chloride flush, sodium phosphate  Physical Exam Vitals and nursing note reviewed.  Constitutional:      Appearance: She is ill-appearing.  Neurological:     Comments: drowsy              Vital Signs: BP (!) 82/55   Pulse 84   Temp 98 F (36.7 C) (Oral)   Resp 20   Ht 5' 6"  (1.676 m)   Wt 71.1 kg   SpO2 99%   BMI 25.30 kg/m  SpO2: SpO2: 99 % O2 Device: O2 Device: Ventilator O2 Flow Rate: O2 Flow Rate (L/min): 2 L/min  Intake/output summary:   Intake/Output Summary (Last  24 hours) at 08/05/2020 1100 Last data filed at 08/05/2020 0530 Gross per 24 hour  Intake 670.68 ml  Output 800 ml  Net -129.32 ml   LBM: Last BM Date: 08/05/20 Baseline Weight: Weight: 71.1 kg Most recent weight: Weight: 71.1 kg       Palliative Assessment/Data: PPS: 10%   Flowsheet Rows     Most Recent Value  Intake Tab  Referral Department Critical care  Unit at Time of Referral ICU  Palliative Care Primary Diagnosis Sepsis/Infectious Disease  Date Notified 08/02/20  Palliative Care Type New Palliative care  Reason for referral Clarify Goals of Care  Date of Admission 08/01/20  Date first seen by Palliative Care 08/03/20  # of days Palliative referral response time 1 Day(s)  # of days IP prior to Palliative referral 1  Clinical Assessment  Psychosocial & Spiritual Assessment  Palliative Care Outcomes      Patient Active Problem List   Diagnosis Date Noted  . Diverticulitis   . Ventilator dependent ([place])   . Septic shock ([place]) 08/01/2020  . Acute on chronic respiratory failure with hypercapnia ([place]) 08/20/2016  . ALS (amyotrophic lateral sclerosis) ([place]) 08/16/2016    Palliative Care Assessment & Plan   Patient Profile: 49 y.o. female  with past medical history of ALS (trach and vent dependen since 2017) admitted on 08/01/2020 from home with sepsis r/t diverticulitis. Palliative medicine consulted for goals of care.    Assessment/Recommendations/Plan  Will decrease baclofen dose to see if this improves mental status Continue current home ALS symptom management regimen SLP ordered for cognitive/communication eval- she uses a gaze tablet at home- will ask  if similar device can be set for her here to assist with history and exam  Goals of Care and Additional Recommendations: Limitations on Scope of Treatment: Full Scope Treatment  Code Status: Full code  Prognosis:  Unable to determine  Discharge Planning: Home with Palliative Services  Care plan was discussed with patient's care team.  Thank you for allowing the Palliative Medicine Team to assist in the care of this patient.   Time In: 1042 Time Out: 1111 Total Time 28 minutes Prolonged Time Billed no      Greater than 50%  of this time was spent counseling and coordinating care related to the above assessment and plan.  Mariana Kaufman, AGNP-C Palliative Medicine   Please contact Palliative Medicine Team phone at [phone] for questions and concerns.

## 2020-08-05 NOTE — Progress Notes (Signed)
[facility] SUR[facility]ICAL PROGRESS NOTE (cpt [phone])  Hospital Day(s): 4.   Interval History: Patient seen and examined, no acute events or new complaints overnight aside from some hypogylcemia. Patient resting comfortably in bed, but does not participate much in history. She did have improvement in her leukocytosis this morning to 12.9K. Renal function remains normal with sCr - 0.46, good UO. Hypokalemia improved some to 3.0. Also with hypophosphatemia to 1.3. She had remained NPO and tube feedings are being held.   Review of Systems:  Unable to reliably preform secondary to nonverbal status   Vital signs in last 24 hours: [min-max] current  Temp:  [97.8 F (36.6 C)-98.3 F (36.8 C)] 97.9 F (36.6 C) (08/26 0010) Pulse Rate:  [77-114] 84 (08/26 0630) Resp:  [12-22] 20 (08/26 0630) BP: (79-115)/(50-69) 94/56 (08/26 0630) SpO2:  [97 %-99 %] 99 % (08/26 0630) FiO2 (%):  [28 %] 28 % (08/26 0306)     Height: 5\' 6"  (167.6 cm) Weight: 71.1 kg BMI (Calculated): 25.31   Intake/Output last 2 shifts:  08/25 0701 - 08/26 0700 In: 670.7 [I.V.:10; IV Piggyback:660.7] Out: 1100 [Urine:1100]   Physical Exam:  Constitutional: alert, cooperative and no distress HENT: normocephalic without obvious abnormality, tracheostomy present Respiratory:On ventilator Cardiovascular: tachycardic and sinus rhythm  Gastrointestinal: soft,tenderness appears improved this morning, and non-distended, no rebound/guarding. PEG in place Genitourinary: Foley in palce  Labs:  CBC Latest Ref Rng & Units 08/05/2020 08/04/2020 08/03/2020  WBC 4.0 - 10.5 K/uL 12.9(H) 19.1(H) 17.9(H)  Hemoglobin 12.0 - 15.0 g/dL 08/05/2020 69.4 85.4  Hematocrit 36 - 46 % 39.4 40.2 40.8  Platelets 150 - 400 K/uL 275 298 299   CMP Latest Ref Rng & Units 08/05/2020 08/04/2020 08/03/2020  Glucose 70 - 99 mg/dL 08/05/2020) 71 -  BUN 6 - 20 mg/dL 13 19 -  Creatinine 03(J - 1.00 mg/dL 0.09 3.81 -  Sodium 8.29 - 145 mmol/L 148(H) 146(H) -   Potassium 3.5 - 5.1 mmol/L 3.0(L) 3.2(L) 3.9  Chloride 98 - 111 mmol/L 119(H) 111 -  CO2 22 - 32 mmol/L 17(L) 16(L) -  Calcium 8.9 - 10.3 mg/dL 9.1 937) -  Total Protein 6.5 - 8.1 g/dL - - -  Total Bilirubin 0.3 - 1.2 mg/dL - - -  Alkaline Phos 38 - 126 U/L - - -  AST 15 - 41 U/L - - -  ALT 0 - 44 U/L - - -     Imaging studies: No new pertinent imaging studies   Assessment/Plan: (ICD-10's: K41.92) 49 y.o. female with improvement in her leukocytosis and unchanged abdominal pain attributable to acute uncomplicated diverticulitis (although this is challenging to assess given lack subjective input), complicated by pertinent comorbidities includingALS   - I do think it would be reasonable to repeat CT Abdomen/Pelvis to ensure now worsening of intra-abdominal process. I will order this for today. If this looks improved or is without worsening then I think it would be reasonable to re-start tube feedings.    - Continue NPO for now; hold tube feedings until after CT - Continue IV Abx (Zosyn) - Monitor leukocytosis; improved - Monitor abdominal examination             - Replete electrolytes; K+ improving  - Pain control prn; antiemetics prn - No emergent surgical intervention - Appreciate palliative consultation; full scope - Further management per primary service; we will follow  All of the above findings and recommendations were discussed with the patient, and the medical team.  --  Lynden Oxford, PA-C [facility] 08/05/2020, 7:49 AM [phone] M-F: 7am - 4pm

## 2020-08-05 NOTE — Progress Notes (Signed)
Nutrition Follow-up  DOCUMENTATION CODES:   Not applicable  INTERVENTION:  Plan is to initiate TPN tomorrow per pharmacy.  Recommend measuring daily weights on TPN.  Monitor magnesium, potassium, and phosphorus daily for at least 3 days, MD to replete as needed, as pt is at risk for refeeding syndrome.  NUTRITION DIAGNOSIS:   Inadequate oral intake related to inability to eat, dysphagia as evidenced by NPO status (reliance on tube feeds and free water flush via G-tube to meet calorie/protein/hydration needs.).  Ongoing.  GOAL:   Patient will meet greater than or equal to 90% of their needs  Not met.  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  Plan was for possible initiation of tube feeds pending CT abdomen/pelvis this afternoon. However per CT Abdomen/pelvis today there was small volume pneumoperitoneum in ventral abdomen with small loculations of air in small bowel mesentery and sigmoid mesocolon. Per report findings consistent with bowel perforation of uncertain etiology. Plan now to continue to hold tube feeds and initiate TPN tomorrow pending electrolytes. Plan is for conservative management at this time.  Patient is currently on ventilator support via trach (home vent) MV: 5.6 L/min Temp (24hrs), Avg:98 F (36.7 C), Min:97.8 F (36.6 C), Max:98.3 F (36.8 C)  Medications reviewed and include: famotidine, D5W at 100 mL/hr (120 grams dextrose, 408 kcal daily), Zosyn, potassium phosphate 30 mmol in D5 500 mL infusion once IV today (25 grams dextrose, 85 kcal daily).  Labs reviewed: CBG 49-175, Sodium 148, Potassium 3, Chloride 119, CO2 17, Phosphorus 1.3.  IV Access: right brachial double lumen PICC placed 8/23  I/O: 1100 mL UOP yesterday (0.6 mL/kg/hr)  No weight to  trend since 8/22  Discussed with RN and on rounds.  Diet Order:   Diet Order            Diet NPO time specified  Diet effective now                EDUCATION NEEDS:   No education needs have been identified at this time  Skin:  Skin Assessment: Reviewed RN Assessment  Last BM:  08/01/2020  Height:   Ht Readings from Last 1 Encounters:  08/01/20 5' 6"  (1.676 m)   Weight:   Wt Readings from Last 1 Encounters:  08/01/20 71.1 kg   BMI:  Body mass index is 25.3 kg/m.  Estimated Nutritional Needs:   Kcal:  1420  Protein:  70-85 grams  Fluid:  1.2-1.4 L/day  Jacklynn Barnacle, MS, RD, LDN Pager number available on Amion

## 2020-08-06 ENCOUNTER — Inpatient Hospital Stay: Payer: Medicare HMO

## 2020-08-06 LAB — CBC
HCT: 36.4 % (ref 36.0–46.0)
Hemoglobin: 11.9 g/dL — ABNORMAL LOW (ref 12.0–15.0)
MCH: 31.2 pg (ref 26.0–34.0)
MCHC: 32.7 g/dL (ref 30.0–36.0)
MCV: 95.3 fL (ref 80.0–100.0)
Platelets: 254 10*3/uL (ref 150–400)
RBC: 3.82 MIL/uL — ABNORMAL LOW (ref 3.87–5.11)
RDW: 14.9 % (ref 11.5–15.5)
WBC: 9.5 10*3/uL (ref 4.0–10.5)
nRBC: 0 % (ref 0.0–0.2)

## 2020-08-06 LAB — GLUCOSE, CAPILLARY
Glucose-Capillary: 134 mg/dL — ABNORMAL HIGH (ref 70–99)
Glucose-Capillary: 146 mg/dL — ABNORMAL HIGH (ref 70–99)
Glucose-Capillary: 138 mg/dL — ABNORMAL HIGH (ref 70–99)
Glucose-Capillary: 87 mg/dL (ref 70–99)
Glucose-Capillary: 113 mg/dL — ABNORMAL HIGH (ref 70–99)

## 2020-08-06 LAB — BASIC METABOLIC PANEL
Anion gap: 8 (ref 5–15)
BUN: 7 mg/dL (ref 6–20)
CO2: 24 mmol/L (ref 22–32)
Calcium: 8.4 mg/dL — ABNORMAL LOW (ref 8.9–10.3)
Chloride: 110 mmol/L (ref 98–111)
Creatinine, Ser: <0.3 mg/dL — ABNORMAL LOW (ref 0.44–1.00)
Glucose, Bld: 153 mg/dL — ABNORMAL HIGH (ref 70–99)
Potassium: 2.8 mmol/L — ABNORMAL LOW (ref 3.5–5.1)
Sodium: 142 mmol/L (ref 135–145)

## 2020-08-06 LAB — CULTURE, BLOOD (ROUTINE X 2)
Culture: NO GROWTH
Culture: NO GROWTH
Special Requests: ADEQUATE
Special Requests: ADEQUATE

## 2020-08-06 LAB — PHOSPHORUS
Phosphorus: 1.5 mg/dL — ABNORMAL LOW (ref 2.5–4.6)
Phosphorus: 4 mg/dL (ref 2.5–4.6)

## 2020-08-06 LAB — MAGNESIUM
Magnesium: 1.8 mg/dL (ref 1.7–2.4)
Magnesium: 2.3 mg/dL (ref 1.7–2.4)

## 2020-08-06 LAB — LACTIC ACID, PLASMA: Lactic Acid, Venous: 1.4 mmol/L (ref 0.5–1.9)

## 2020-08-06 LAB — POTASSIUM: Potassium: 3.8 mmol/L (ref 3.5–5.1)

## 2020-08-06 MED ORDER — POTASSIUM PHOSPHATES 15 MMOLE/5ML IV SOLN
45.0000 mmol | Freq: Once | INTRAVENOUS | Status: AC
  Administered 2020-08-06: 45 mmol via INTRAVENOUS
  Filled 2020-08-06: qty 15

## 2020-08-06 MED ORDER — MAGNESIUM SULFATE 2 GM/50ML IV SOLN
2.0000 g | Freq: Once | INTRAVENOUS | Status: AC
  Administered 2020-08-06: 2 g via INTRAVENOUS
  Filled 2020-08-06: qty 50

## 2020-08-06 MED ORDER — LACTATED RINGERS IV BOLUS
1000.0000 mL | Freq: Once | INTRAVENOUS | Status: AC
  Administered 2020-08-06: 1000 mL via INTRAVENOUS

## 2020-08-06 MED ORDER — TRAVASOL 10 % IV SOLN
INTRAVENOUS | Status: AC
  Filled 2020-08-06: qty 297.6

## 2020-08-06 NOTE — Progress Notes (Signed)
Brenda Harman, NP notified of BP 79/59 recheck 80/49. See new order for IVF bolus.

## 2020-08-06 NOTE — Progress Notes (Signed)
PHARMACY - [facility] NUTRITION CONSULT NOTE   Indication: prolonged inability for tube feeds  Patient Measurements: Height: 5\' 6"  (167.6 cm) Weight: 71.1 kg (156 lb 12 oz) IBW/kg (Calculated) : 59.3 TPN AdjBW (KG): 71.1 Body mass index is 25.3 kg/m.  Assessment: 49 year old female admitted with diverticulitis. Plan was to possibly restart tube feeds after holding for several days. Repeat CT A/P with increase in pneumoperitoneum although patient clinically improving. Plan to continue conservative management for now. Patient to start on TPN as tube feeds will continue to be held.  Glucose / Insulin: episodes of hypoglycemia, on D5LR Electrolytes: hypokalemia, hypophosphatemia - replacing Renal: WNL LFTs / TGs: WNL Prealbumin / albumin: albumin 4 MIVF: D5LR at 75 ml/hr GI Imaging: CT A/P w/ increase in pneumoperitoneum  Central access: PICC TPN start date: 8/27  Nutritional Goals (per RD recommendation on 8/27): kCal: 1420, Protein: 70-85 g/day, Fluid: 1.2-1.4 L/day Goal TPN rate is ~55 mL/hr (provides 81 g of protein and 1200-1300 kcals per day)  Current Nutrition: NPO  Plan:  Start TPN at 20 mL/hr at 1800 Electrolytes in TPN: 50 mEq/L of Na, 50 mEq/L of K, 5 mEq/L of Ca, 5 mEq/L of Mg, and 15 mmol/L of Phos. Cl:Ac ratio 1:1 Add standard MVI and trace elements to TPN Issues with hypoglycemia - no insulin at this time  Continue MIVF at 75 ml/hr until able to advance TPN, then likely can reduce/stop Monitor TPN labs on Mon/Thurs, daily for now s/t electrolyte abnormalities  9/27, PharmD 08/06/2020,11:58 AM

## 2020-08-06 NOTE — Progress Notes (Signed)
BP improved with 2nd IVF bolus current BP is 97/60. Will continue to monitor.

## 2020-08-06 NOTE — Progress Notes (Signed)
IVF bolus completed. BP 78/46, Brenda Harman, NP notified. See new order.

## 2020-08-06 NOTE — Progress Notes (Signed)
CRITICAL CARE PROGRESS NOTE    Name: Brenda Anderson MRN: [id number] DOB: 16-Oct-1971     LOS: 5   SUBJECTIVE FINDINGS & SIGNIFICANT EVENTS    Patient description:  49 y.o.femalewith a history of ALS, PEG tube, tracheostomy brought in by nurse because of abdominal distention.   08/06/20- patient is tearful during evaluation this am.  Discussed care plan with surgery team, patient developed pneumoperitoneum.   Lines/tubes : PICC Double Lumen 08/02/20 PICC Right Brachial 39 cm 1 cm (Active)  Indication for Insertion or Continuance of Line Prolonged intravenous therapies 08/05/20 2000  Exposed Catheter (cm) 1 cm 08/02/20 1600  Site Assessment Clean;Dry;Intact 08/05/20 2000  Lumen #1 Status Infusing;Flushed;Blood return noted 08/05/20 2000  Lumen #2 Status Saline locked;Flushed;Blood return noted 08/05/20 2000  Dressing Type Transparent;Occlusive 08/05/20 2000  Dressing Status Clean;Dry;Intact;Antimicrobial disc in place 08/05/20 2000  Safety [place] Not Applicable 78/29/56 2130  Line Care Connections checked and tightened 08/05/20 2000  Line Adjustment (NICU/IV Team Only) No 08/04/20 2000  Dressing Intervention New dressing 08/02/20 1600  Dressing Change Due 08/09/20 08/05/20 2000     Gastrostomy/Enterostomy Percutaneous endoscopic gastrostomy (PEG) RUQ (Active)  Surrounding Skin Dry;Intact 08/06/20 0400  Tube Status Open to gravity drainage 08/06/20 0400  Drainage Appearance Bile;Green 08/06/20 0400  Dressing Status Clean;Dry;Intact 08/06/20 0400  Dressing Intervention Dressing changed 08/06/20 0400  Dressing Type Split gauze 08/06/20 0400  Dressing Change Due 08/06/20 08/06/20 0400  Output (mL) 300 mL 08/06/20 0356     Urethral Catheter Urologist [place];Non-latex (Active)  Indication for Insertion or  Continuance of Catheter Chronic catheter use 08/05/20 2000  Site Assessment Clean;Intact 08/05/20 2000  Catheter Maintenance Bag below level of bladder;Catheter secured;Drainage bag/tubing not touching floor;Insertion date on drainage bag;No dependent loops;Seal intact 08/05/20 2000  Collection Container Standard drainage bag 08/05/20 2000  Securement Method Securing device (Describe) 08/05/20 2000  Urinary Catheter Interventions (if applicable) Unclamped 86/57/84 2000  Output (mL) 225 mL 08/06/20 0356    Microbiology/Sepsis markers: Results for orders placed or performed during the hospital encounter of 08/01/20  SARS Coronavirus 2 by RT PCR (hospital order, performed in [facility] hospital lab) Nasopharyngeal Nasopharyngeal Swab     Status: None   Collection Time: 08/01/20  3:47 PM   Specimen: Nasopharyngeal Swab  Result Value Ref Range Status   SARS Coronavirus 2 NEGATIVE NEGATIVE Final    Comment: (NOTE) SARS-CoV-2 target nucleic acids are NOT DETECTED.  The SARS-CoV-2 RNA is generally detectable in upper and lower respiratory specimens during the acute phase of infection. The lowest concentration of SARS-CoV-2 viral copies this assay can detect is 250 copies / mL. A negative result does not preclude SARS-CoV-2 infection and should not be used as the sole basis for treatment or other patient management decisions.  A negative result may occur with improper specimen collection / handling, submission of specimen other than nasopharyngeal swab, presence of viral mutation(s) within the areas targeted by this assay, and inadequate number of viral copies (<250 copies / mL). A negative result must be combined with clinical observations, patient history, and epidemiological information.  Fact Sheet for Patients:   StrictlyIdeas.no  Fact Sheet for Healthcare Providers: BankingDealers.co.za  This test is not yet approved or  cleared by the  [place] FDA and has been authorized for detection and/or diagnosis of SARS-CoV-2 by FDA under an Emergency Use Authorization (EUA).  This EUA will remain in effect (meaning this test can be used) for the duration of the COVID-19 declaration under  Section 564(b)(1) of the Act, 21 U.S.C. section 360bbb-3(b)(1), unless the authorization is terminated or revoked sooner.  Performed at [facility], [address] [place], [place] [address]   CULTURE, BLOOD (ROUTINE X 2) w Reflex to ID Panel     Status: None   Collection Time: 08/01/20  5:42 PM   Specimen: BLOOD  Result Value Ref Range Status   Specimen Description BLOOD BLOOD LEFT HAND  Final   Special Requests   Final    BOTTLES DRAWN AEROBIC AND ANAEROBIC Blood Culture adequate volume   Culture   Final    NO GROWTH 5 DAYS Performed at [facility], [address] [place], [place] [address]    Report Status 08/06/2020 FINAL  Final  MRSA PCR Screening     Status: None   Collection Time: 08/01/20  5:55 PM   Specimen: Nasopharyngeal  Result Value Ref Range Status   MRSA by PCR NEGATIVE NEGATIVE Final    Comment:        The GeneXpert MRSA Assay (FDA approved for NASAL specimens only), is one component of a comprehensive MRSA colonization surveillance program. It is not intended to diagnose MRSA infection nor to guide or monitor treatment for MRSA infections. Performed at [facility], [address] [place], [place] [address]   CULTURE, BLOOD (ROUTINE X 2) w Reflex to ID Panel     Status: None   Collection Time: 08/01/20  6:04 PM   Specimen: BLOOD  Result Value Ref Range Status   Specimen Description BLOOD BLOOD RIGHT HAND  Final   Special Requests   Final    BOTTLES DRAWN AEROBIC AND ANAEROBIC Blood Culture adequate volume   Culture   Final    NO GROWTH 5 DAYS Performed at [facility], [address], [place], [place] [address]    Report Status 08/06/2020 FINAL  Final     Anti-infectives:  Anti-infectives (From admission, onward)   Start     Dose/Rate Route Frequency Ordered Stop   08/02/20 0000  piperacillin-tazobactam (ZOSYN) IVPB 3.375 g        3.375 g 12.5 mL/hr over 240 Minutes Intravenous Every 8 hours 08/01/20 2210     08/01/20 1530  piperacillin-tazobactam (ZOSYN) IVPB 3.375 g        3.375 g 100 mL/hr over 30 Minutes Intravenous  Once 08/01/20 1524 08/01/20 1653       Consults: Treatment Team:  Jules Husbands, MD Abbie Sons, MD     PAST MEDICAL HISTORY   Past Medical History:  Diagnosis Date  . ALS (amyotrophic lateral sclerosis) ([place])   . PEG (percutaneous endoscopic gastrostomy) status ([place])   . Tracheostomy dependence ([place])      SURGICAL HISTORY   History reviewed. No pertinent surgical history.   FAMILY HISTORY   No family history on file.   SOCIAL HISTORY   Social History   Tobacco Use  . Smoking status: Former Smoker    Packs/day: 1.00    Years: 30.00    Pack years: 30.00    Types: Cigarettes    Quit date: 12/11/2012    Years since quitting: 7.6  . Smokeless tobacco: Never Used  Substance Use Topics  . Alcohol use: No  . Drug use: No     MEDICATIONS   Current Medication:  Current Facility-Administered Medications:  .  baclofen (LIORESAL) tablet 10 mg, 10 mg, Per Tube, TID, Earlie Counts, NP, 10 mg at 08/05/20 2204 .  buPROPion (  WELLBUTRIN) tablet 100 mg, 100 mg, Per Tube, TID, Earlie Counts, NP, 100 mg at 08/05/20 2204 .  chlorhexidine gluconate (MEDLINE KIT) (PERIDEX) 0.12 % solution 15 mL, 15 mL, Mouth Rinse, BID, Kasa, Kurian, MD, 15 mL at 08/06/20 0820 .  Chlorhexidine Gluconate Cloth 2 % PADS 6 each, 6 each, Topical, Daily, Flora Lipps, MD, 6 each at 08/05/20 2205 .  citalopram (CELEXA) tablet 20 mg, 20 mg, Per Tube, Daily, Mahan, Kasie J, NP, 20 mg at 08/04/20 1226 .  dextrose 5 % in lactated ringers infusion, , Intravenous, Continuous, Blakeney, Dreama Saa, NP, Last Rate: 125 mL/hr at  08/06/20 0700, Rate Verify at 08/06/20 0700 .  enoxaparin (LOVENOX) injection 40 mg, 40 mg, Subcutaneous, Q24H, Kasa, Kurian, MD, 40 mg at 08/05/20 2204 .  famotidine (PEPCID) 40 MG/5ML suspension 20 mg, 20 mg, Per Tube, BID, Flora Lipps, MD, 20 mg at 08/05/20 2204 .  fluticasone (FLONASE) 50 MCG/ACT nasal spray 1 spray, 1 spray, Each Nare, Daily, Mahan, Kasie J, NP, 1 spray at 08/05/20 1040 .  HYDROcodone-acetaminophen (HYCET) 7.5-325 mg/15 ml solution 10 mL, 10 mL, Oral, Q4H PRN, Tukov-Yual, Magdalene S, NP, 10 mL at 08/01/20 2046 .  MEDLINE mouth rinse, 15 mL, Mouth Rinse, 10 times per day, Flora Lipps, MD, 15 mL at 08/06/20 0634 .  melatonin tablet 7.5 mg, 7.5 mg, Per Tube, QHS, Kasa, Kurian, MD, 7.5 mg at 08/04/20 2238 .  montelukast (SINGULAIR) tablet 10 mg, 10 mg, Per Tube, QHS, Mahan, Wayna Chalet, NP, 10 mg at 08/05/20 2206 .  morphine 2 MG/ML injection 2 mg, 2 mg, Intravenous, Once, Carrie Mew, MD .  morphine 2 MG/ML injection 2 mg, 2 mg, Intravenous, Q1H PRN, Flora Lipps, MD, 2 mg at 08/04/20 1059 .  naphazoline-glycerin (CLEAR EYES REDNESS) ophth solution 1-2 drop, 1-2 drop, Both Eyes, QID PRN, Flora Lipps, MD .  ondansetron (ZOFRAN) injection 4 mg, 4 mg, Intravenous, Q6H PRN, Darel Hong D, NP, 4 mg at 08/03/20 2141 .  piperacillin-tazobactam (ZOSYN) IVPB 3.375 g, 3.375 g, Intravenous, Q8H, Tukov-Yual, Magdalene S, NP, Last Rate: 12.5 mL/hr at 08/06/20 0830, 3.375 g at 08/06/20 0830 .  polyethylene glycol (MIRALAX / GLYCOLAX) packet 17 g, 17 g, Per Tube, Daily PRN, Mahan, Kasie J, NP .  potassium PHOSPHATE 45 mmol in dextrose 5 % 500 mL infusion, 45 mmol, Intravenous, Once, Awilda Bill, NP, Last Rate: 86 mL/hr at 08/06/20 0942, 45 mmol at 08/06/20 0942 .  pregabalin (LYRICA) capsule 100 mg, 100 mg, Per Tube, BID, Earlie Counts, NP, 100 mg at 08/05/20 2204 .  pregabalin (LYRICA) capsule 25 mg, 25 mg, Per Tube, Daily PRN, Earlie Counts, NP .  promethazine (PHENERGAN)  injection 12.5 mg, 12.5 mg, Intravenous, Q4H PRN, Flora Lipps, MD, 12.5 mg at 08/04/20 0448 .  sodium chloride flush (NS) 0.9 % injection 10-40 mL, 10-40 mL, Intracatheter, Q12H, Kasa, Kurian, MD, 10 mL at 08/05/20 2206 .  sodium chloride flush (NS) 0.9 % injection 10-40 mL, 10-40 mL, Intracatheter, PRN, Flora Lipps, MD .  sodium phosphate (FLEET) 7-19 GM/118ML enema 1 enema, 1 enema, Rectal, Daily PRN, Ronny Bacon, MD, 1 enema at 08/03/20 1300    ALLERGIES   Ciprofloxacin, Other, and Ultram [tramadol hcl]    REVIEW OF SYSTEMS     Unable to obtain due to acutely ill status with trache and sepsis.  PHYSICAL EXAMINATION   Vital Signs: Temp:  [97 F (36.1 C)-97.9 F (36.6 C)] 97 F (36.1 C) (08/27 0400)  Pulse Rate:  [62-96] 96 (08/27 0700) Resp:  [12-17] 15 (08/27 0700) BP: (72-130)/(45-73) 120/73 (08/27 0700) SpO2:  [96 %-100 %] 97 % (08/27 0756) FiO2 (%):  [28 %] 28 % (08/27 0756)  GENERAL:chronically ill apprearing age appropriate HEAD: Normocephalic, atraumatic.  EYES: Pupils equal, round, reactive to light.  No scleral icterus.  MOUTH: Moist mucosal membrane. NECK: Supple. No thyromegaly. No nodules. No JVD.  PULMONARY: NIV respiratory sounds without wheezing /crackles CARDIOVASCULAR: S1 and S2. Regular rate and rhythm. No murmurs, rubs, or gallops.  GASTROINTESTINAL: Soft, nontender, non-distended. No masses. Positive bowel sounds. No hepatosplenomegaly.  MUSCULOSKELETAL: No swelling, clubbing, or edema.  NEUROLOGIC: Mild distress due to acute illness SKIN:intact,warm,dry   PERTINENT DATA     Infusions: . dextrose 5% lactated ringers 125 mL/hr at 08/06/20 0700  . piperacillin-tazobactam (ZOSYN)  IV 3.375 g (08/06/20 0830)  . potassium PHOSPHATE IVPB (in mmol) 45 mmol (08/06/20 0942)   Scheduled Medications: . baclofen  10 mg Per Tube TID  . buPROPion  100 mg Per Tube TID  . chlorhexidine gluconate (MEDLINE KIT)  15 mL Mouth Rinse BID  .  Chlorhexidine Gluconate Cloth  6 each Topical Daily  . citalopram  20 mg Per Tube Daily  . enoxaparin (LOVENOX) injection  40 mg Subcutaneous Q24H  . famotidine  20 mg Per Tube BID  . fluticasone  1 spray Each Nare Daily  . mouth rinse  15 mL Mouth Rinse 10 times per day  . melatonin  7.5 mg Per Tube QHS  . montelukast  10 mg Per Tube QHS  .  morphine injection  2 mg Intravenous Once  . pregabalin  100 mg Per Tube BID  . sodium chloride flush  10-40 mL Intracatheter Q12H   PRN Medications: HYDROcodone-acetaminophen, morphine injection, naphazoline-glycerin, ondansetron (ZOFRAN) IV, polyethylene glycol, pregabalin, promethazine, sodium chloride flush, sodium phosphate Hemodynamic parameters:   Intake/Output: 08/26 0701 - 08/27 0700 In: 4504.1 [I.V.:1843.5; IV Piggyback:2660.5] Out: 3557 [Urine:1125; Drains:300]  Ventilator  Settings: Vent Mode: AC FiO2 (%):  [28 %] 28 % Set Rate:  [12 bmp] 12 bmp Vt Set:  [450 mL] 450 mL PEEP:  [5 cmH20] 5 cmH20     LAB RESULTS:  Basic Metabolic Panel: Recent Labs  Lab 08/02/20 0600 08/02/20 0600 08/03/20 0750 08/03/20 2121 08/04/20 0447 08/04/20 0447 08/05/20 0532 08/06/20 0315  NA 142  --  143  --  146*  --  148* 142  K 3.7   < > 2.7*   < > 3.2*   < > 3.0* 2.8*  CL 106  --  106  --  111  --  119* 110  CO2 24  --  17*  --  16*  --  17* 24  GLUCOSE 83  --  60*  --  71  --  58* 153*  BUN 18  --  24*  --  19  --  13 7  CREATININE 0.34*  --  0.49  --  0.45  --  0.46 <0.30*  CALCIUM 9.3  --  9.2  --  8.6*  --  9.1 8.4*  MG  --   --   --   --  2.2  --  2.2 1.8  PHOS  --   --   --   --  2.9  --  1.3* 1.5*   < > = values in this interval not displayed.   Liver Function Tests: Recent Labs  Lab 08/01/20 1346  AST 22  ALT 17  ALKPHOS 95  BILITOT 0.7  PROT 8.0  ALBUMIN 4.0   Recent Labs  Lab 08/01/20 1346  LIPASE 26   No results for input(s): AMMONIA in the last 168 hours. CBC: Recent Labs  Lab 08/02/20 0600 08/03/20 0750  08/04/20 0447 08/05/20 0532 08/06/20 0315  WBC 17.6* 17.9* 19.1* 12.9* 9.5  HGB 13.4 12.7 12.6 12.2 11.9*  HCT 40.1 40.8 40.2 39.4 36.4  MCV 94.1 100.0 102.6* 100.8* 95.3  PLT 280 299 298 275 254   Cardiac Enzymes: No results for input(s): CKTOTAL, CKMB, CKMBINDEX, TROPONINI in the last 168 hours. BNP: Invalid input(s): POCBNP CBG: Recent Labs  Lab 08/05/20 1826 08/05/20 1951 08/05/20 2307 08/06/20 0349 08/06/20 0725  GLUCAP 170* 175* 161* 134* 146*       IMAGING RESULTS:  Imaging: CT ABDOMEN PELVIS W CONTRAST  Result Date: 08/05/2020 CLINICAL DATA:  Abdominal abscess or infection suspected, history of diverticulitis, persistent leukocytosis EXAM: CT ABDOMEN AND PELVIS WITH CONTRAST TECHNIQUE: Multidetector CT imaging of the abdomen and pelvis was performed using the standard protocol following bolus administration of intravenous contrast. CONTRAST:  167m OMNIPAQUE IOHEXOL 300 MG/ML SOLN, additional oral enteric contrast COMPARISON:  08/01/2020 FINDINGS: Lower chest: Small right pleural effusion and associated atelectasis or consolidation. Bilateral breast implants. Hepatobiliary: No solid liver abnormality is seen. No gallstones, gallbladder wall thickening, or biliary dilatation. Pancreas: Unremarkable. No pancreatic ductal dilatation or surrounding inflammatory changes. Spleen: Normal in size without significant abnormality. Adrenals/Urinary Tract: Adrenal glands are unremarkable. Nonobstructive right nephrolithiasis. No hydronephrosis. Foley catheter in the urinary bladder, containing air within the bladder lumen. Bladder is unremarkable. Stomach/Bowel: Percutaneous gastrostomy. Appendix is not clearly visualized and may be surgically absent. Severe pancolonic diverticulosis. There is wall thickening of the distal sigmoid colon and rectum with interval disimpaction of large stool balls previously seen in the rectum. Vascular/Lymphatic: Aortic atherosclerosis. No enlarged  abdominal or pelvic lymph nodes. Reproductive: No mass or other significant abnormality. Other: No abdominal wall hernia or abnormality. There is small volume pneumoperitoneum in the ventral abdomen, with small loculations of air in the small bowel mesentery and sigmoid mesocolon (series 5, image 25, series 2, image 70). Musculoskeletal: No acute or significant osseous findings. Severe sarcopenia throughout the included musculature. IMPRESSION: 1. There is small volume pneumoperitoneum in the ventral abdomen, with small loculations of air in the small bowel mesentery and sigmoid mesocolon. Findings are consistent with bowel perforation although of uncertain etiology, possibly related to mechanical disimpaction or perforated stercoral ulceration given previously noted large stool balls in the rectum. 2. There is wall thickening of the distal sigmoid colon and rectum suggestive of nonspecific infectious or inflammatory proctocolitis. 3. Severe pancolonic diverticulosis without evidence of acute diverticulitis. 4. Small right pleural effusion and associated atelectasis or consolidation. 5. Nonobstructive right nephrolithiasis. 6. Severe sarcopenia. 7. Aortic Atherosclerosis (ICD10-I70.0). These results will be called to the ordering clinician or representative by the Radiologist Assistant, and communication documented in the PACS or [employer] Electronically Signed   By: AEddie CandleM.D.   On: 08/05/2020 11:35   DG Chest Port 1 View  Result Date: 08/04/2020 CLINICAL DATA:  Leukocytosis. EXAM: PORTABLE CHEST 1 VIEW COMPARISON:  Chest radiograph 08/31/2016. Lung bases from abdominal CT 08/01/2020 FINDINGS: Tracheostomy tube tip at the thoracic inlet. Right upper extremity PICC tip in the lower SVC. Chronic elevation of right hemidiaphragm. Adjacent compressive atelectasis/scarring at the right lung base. There is mild biapical pleuroparenchymal scarring. No acute or confluent airspace disease. No pleural  effusion or pneumothorax. No acute osseous abnormalities are seen. IMPRESSION: 1. Chronic elevation of right hemidiaphragm with chronic atelectasis or scarring at the right lung base. 2. Tracheostomy tube tip at the thoracic inlet. 3. Right upper extremity PICC tip in the lower SVC. Electronically Signed   By: Keith Rake M.D.   On: 08/04/2020 17:28   _0 @ CT ABDOMEN PELVIS W CONTRAST  Result Date: 08/05/2020 CLINICAL DATA:  Abdominal abscess or infection suspected, history of diverticulitis, persistent leukocytosis EXAM: CT ABDOMEN AND PELVIS WITH CONTRAST TECHNIQUE: Multidetector CT imaging of the abdomen and pelvis was performed using the standard protocol following bolus administration of intravenous contrast. CONTRAST:  120m OMNIPAQUE IOHEXOL 300 MG/ML SOLN, additional oral enteric contrast COMPARISON:  08/01/2020 FINDINGS: Lower chest: Small right pleural effusion and associated atelectasis or consolidation. Bilateral breast implants. Hepatobiliary: No solid liver abnormality is seen. No gallstones, gallbladder wall thickening, or biliary dilatation. Pancreas: Unremarkable. No pancreatic ductal dilatation or surrounding inflammatory changes. Spleen: Normal in size without significant abnormality. Adrenals/Urinary Tract: Adrenal glands are unremarkable. Nonobstructive right nephrolithiasis. No hydronephrosis. Foley catheter in the urinary bladder, containing air within the bladder lumen. Bladder is unremarkable. Stomach/Bowel: Percutaneous gastrostomy. Appendix is not clearly visualized and may be surgically absent. Severe pancolonic diverticulosis. There is wall thickening of the distal sigmoid colon and rectum with interval disimpaction of large stool balls previously seen in the rectum. Vascular/Lymphatic: Aortic atherosclerosis. No enlarged abdominal or pelvic lymph nodes. Reproductive: No mass or other significant abnormality. Other: No abdominal wall hernia or abnormality. There is small  volume pneumoperitoneum in the ventral abdomen, with small loculations of air in the small bowel mesentery and sigmoid mesocolon (series 5, image 25, series 2, image 70). Musculoskeletal: No acute or significant osseous findings. Severe sarcopenia throughout the included musculature. IMPRESSION: 1. There is small volume pneumoperitoneum in the ventral abdomen, with small loculations of air in the small bowel mesentery and sigmoid mesocolon. Findings are consistent with bowel perforation although of uncertain etiology, possibly related to mechanical disimpaction or perforated stercoral ulceration given previously noted large stool balls in the rectum. 2. There is wall thickening of the distal sigmoid colon and rectum suggestive of nonspecific infectious or inflammatory proctocolitis. 3. Severe pancolonic diverticulosis without evidence of acute diverticulitis. 4. Small right pleural effusion and associated atelectasis or consolidation. 5. Nonobstructive right nephrolithiasis. 6. Severe sarcopenia. 7. Aortic Atherosclerosis (ICD10-I70.0). These results will be called to the ordering clinician or representative by the Radiologist Assistant, and communication documented in the PACS or [employer] Electronically Signed   By: AEddie CandleM.D.   On: 08/05/2020 11:35      ASSESSMENT AND PLAN    -Multidisciplinary rounds held today   Sepsis with multi organ dysfunction syndrome   - Present on admission    - due to intra-abdominal infection secondary to diverticulitis complicated by pneumoperitoneum   - trend lactate -use vasopressors to keep MAP>65 -follow ABG and LA -follow up cultures -emperic ABX-on zosyn due to severity of illness -patient not on vasopressors currently  -LR at 125/hr will decrase to 75/hr at this time -surgery on case appreciate input   Amyotrophic Lateral Sclerosis   - advanced stage   - poor prognosis   - Palliative care consultation ordered   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  -proned to recurrent pneumonia  -Tracheal aspirate   - NIV trilogy 100 - Leak 20L/min, Vt 450-500,   5cmH20>20cmH20   Right pleural effusion   - no  need for drainage at this time  - decrease IVF 125cc/h>>25cc/hr while septic once euvolemic d/c IVF   Severe protein calorie malnutrition   - low albumin   - bitemporal wasting and severe sarcopenia   -Nutritional assessment  ID -continue IV abx as prescibed -follow up cultures  GI/Nutrition GI PROPHYLAXIS as indicated DIET-->TF's as tolerated Constipation protocol as indicated  ENDO - ICU hypoglycemic\Hyperglycemia protocol -check FSBS per protocol   ELECTROLYTES -follow labs as needed -replace as needed -pharmacy consultation   DVT/GI PRX ordered -SCDs  TRANSFUSIONS AS NEEDED MONITOR FSBS ASSESS the need for LABS as needed   Critical care provider statement:    Critical care time (minutes):  33   Critical care time was exclusive of:  Separately billable procedures and treating other patients   Critical care was necessary to treat or prevent imminent or life-threatening deterioration of the following conditions:  sepsis due to diverticulitis   Critical care was time spent personally by me on the following activities:  Development of treatment plan with patient or surrogate, discussions with consultants, evaluation of patient's response to treatment, examination of patient, obtaining history from patient or surrogate, ordering and performing treatments and interventions, ordering and review of laboratory studies and re-evaluation of patient's condition.  I assumed direction of critical care for this patient from another provider in my specialty: no    This document was prepared using Dragon voice recognition software and may include unintentional dictation errors.    Ottie Glazier, M.D.  Division of [place]

## 2020-08-06 NOTE — Progress Notes (Signed)
[facility] SURGICAL PROGRESS NOTE (cpt [phone])  Hospital Day(s): 5.   Interval History: Patient seen and examined, overnight with hypotension which responded to IVF bolus o/w hemodynamically stable. Patient is somnolent this morning and does not arouse to verbal or painful stimuli. Leukocytosis is now resolved with morning at 9.5K. Renal function is normal with sCr - <0.30 and UP is good at 1.1L. She continues to have persistent hypokalemia at 2.8 and hypophosphatemia to 1.5. Lactic acid levels have remained normal. We will initiate TPN and tube feedings are held.   Review of Systems:  Unable to reliably preform secondary to non-verbal status  Vital signs in last 24 hours: [min-max] current  Temp:  [97 F (36.1 C)-98 F (36.7 C)] 97 F (36.1 C) (08/27 0400) Pulse Rate:  [62-96] 96 (08/27 0700) Resp:  [12-20] 15 (08/27 0700) BP: (72-130)/(45-73) 120/73 (08/27 0700) SpO2:  [96 %-100 %] 97 % (08/27 0700) FiO2 (%):  [28 %] 28 % (08/27 0400)     Height: 5\' 6"  (167.6 cm) Weight: 71.1 kg BMI (Calculated): 25.31   Intake/Output last 2 shifts:  08/26 0701 - 08/27 0700 In: 4504.1 [I.V.:1843.5; IV Piggyback:2660.5] Out: 1425 [Urine:1125; Drains:300]   Physical Exam:  Constitutional: Somnolent, does not arouse to verbal or painful stimuli HENT: normocephalic without obvious abnormality, tracheostomy present Respiratory:On ventilator Cardiovascular:regular rateand sinus rhythm  Gastrointestinal: soft,I am unable to illicit tenderness nor any evidence of peritonitis although this is very limited given her lack of participation in examination and 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 Genitourinary: Foley in palce   Labs:  CBC Latest Ref Rng & Units 08/06/2020 08/05/2020 08/04/2020  WBC 4.0 - 10.5 K/uL 9.5 12.9(H) 19.1(H)  Hemoglobin 12.0 - 15.0 g/dL 11.9(L) 12.2 12.6  Hematocrit 36 - 46 % 36.4 39.4 40.2  Platelets 150 - 400 K/uL 254 275 298   CMP Latest Ref Rng & Units 08/06/2020  08/05/2020 08/04/2020  Glucose 70 - 99 mg/dL 08/06/2020) 683(M) 71  BUN 6 - 20 mg/dL 7 13 19   Creatinine 0.44 - 1.00 mg/dL 19(Q) <2.22(L  Sodium 135 - 145 mmol/L 142 148(H) 146(H)  Potassium 3.5 - 5.1 mmol/L 2.8(L) 3.0(L) 3.2(L)  Chloride 98 - 111 mmol/L 110 119(H) 111  CO2 22 - 32 mmol/L 24 17(L) 16(L)  Calcium 8.9 - 10.3 mg/dL 7.98) 9.1 9.21)  Total Protein 6.5 - 8.1 g/dL - - -  Total Bilirubin 0.3 - 1.2 mg/dL - - -  Alkaline Phos 38 - 126 U/L - - -  AST 15 - 41 U/L - - -  ALT 0 - 44 U/L - - -     Imaging studies: No new pertinent imaging studies   Assessment/Plan: (ICD-10's: K40.92) 49 y.o. female with hypotension overnight responded to IVF bolus o/w hemodynamically stable admitted with acute uncomplicated diverticulitis found to have increase in pneumoperitoneum on CT A/P (08/26) although she is otherwise clinically improving and without overt evidence of peritonitis or sepsis.    - We will continue to attempt to manage her conservatively with the understanding that if she were to clinically deteriorate she would require urgent intervention  - Recommend continue to hold tube feedings given concern for pneumoperitoneum. Initiate TPN   - Continue IV Abx (Zosyn) - Monitor leukocytosis; resolved - Monitor abdominal examination - Replete electrolytes; hypokalemia persistent  - Pain control prn; antiemetics prn - No emergent surgical intervention - Appreciatepalliative consultation; full scope - Further management per primary service; we will follow   All of  the above findings and recommendations were discussed with the medical team.  -- Lynden Oxford, PA-C [facility] 08/06/2020, 7:49 AM [phone] M-F: 7am - 4pm

## 2020-08-07 LAB — CBC
HCT: 35.9 % — ABNORMAL LOW (ref 36.0–46.0)
Hemoglobin: 11.9 g/dL — ABNORMAL LOW (ref 12.0–15.0)
MCH: 31.7 pg (ref 26.0–34.0)
MCHC: 33.1 g/dL (ref 30.0–36.0)
MCV: 95.7 fL (ref 80.0–100.0)
Platelets: 251 10*3/uL (ref 150–400)
RBC: 3.75 MIL/uL — ABNORMAL LOW (ref 3.87–5.11)
RDW: 15.2 % (ref 11.5–15.5)
WBC: 8.2 10*3/uL (ref 4.0–10.5)
nRBC: 0 % (ref 0.0–0.2)

## 2020-08-07 LAB — DIFFERENTIAL
Abs Immature Granulocytes: 0.05 10*3/uL (ref 0.00–0.07)
Basophils Absolute: 0 10*3/uL (ref 0.0–0.1)
Basophils Relative: 0 %
Eosinophils Absolute: 0.3 10*3/uL (ref 0.0–0.5)
Eosinophils Relative: 4 %
Immature Granulocytes: 1 %
Lymphocytes Relative: 19 %
Lymphs Abs: 1.6 10*3/uL (ref 0.7–4.0)
Monocytes Absolute: 0.7 10*3/uL (ref 0.1–1.0)
Monocytes Relative: 9 %
Neutro Abs: 5.5 10*3/uL (ref 1.7–7.7)
Neutrophils Relative %: 67 %

## 2020-08-07 LAB — GLUCOSE, CAPILLARY
Glucose-Capillary: 126 mg/dL — ABNORMAL HIGH (ref 70–99)
Glucose-Capillary: 127 mg/dL — ABNORMAL HIGH (ref 70–99)
Glucose-Capillary: 131 mg/dL — ABNORMAL HIGH (ref 70–99)
Glucose-Capillary: 139 mg/dL — ABNORMAL HIGH (ref 70–99)
Glucose-Capillary: 173 mg/dL — ABNORMAL HIGH (ref 70–99)
Glucose-Capillary: 197 mg/dL — ABNORMAL HIGH (ref 70–99)
Glucose-Capillary: 71 mg/dL (ref 70–99)

## 2020-08-07 LAB — COMPREHENSIVE METABOLIC PANEL
ALT: 11 U/L (ref 0–44)
AST: 13 U/L — ABNORMAL LOW (ref 15–41)
Albumin: 2.7 g/dL — ABNORMAL LOW (ref 3.5–5.0)
Alkaline Phosphatase: 63 U/L (ref 38–126)
Anion gap: 5 (ref 5–15)
BUN: 7 mg/dL (ref 6–20)
CO2: 28 mmol/L (ref 22–32)
Calcium: 8.4 mg/dL — ABNORMAL LOW (ref 8.9–10.3)
Chloride: 108 mmol/L (ref 98–111)
Creatinine, Ser: <0.3 mg/dL — ABNORMAL LOW (ref 0.44–1.00)
Glucose, Bld: 175 mg/dL — ABNORMAL HIGH (ref 70–99)
Potassium: 3.8 mmol/L (ref 3.5–5.1)
Sodium: 141 mmol/L (ref 135–145)
Total Bilirubin: 0.6 mg/dL (ref 0.3–1.2)
Total Protein: 5.8 g/dL — ABNORMAL LOW (ref 6.5–8.1)

## 2020-08-07 LAB — PREALBUMIN: Prealbumin: 8 mg/dL — ABNORMAL LOW (ref 18–38)

## 2020-08-07 LAB — MAGNESIUM: Magnesium: 2.4 mg/dL (ref 1.7–2.4)

## 2020-08-07 LAB — PHOSPHORUS: Phosphorus: 2.9 mg/dL (ref 2.5–4.6)

## 2020-08-07 LAB — TRIGLYCERIDES: Triglycerides: 194 mg/dL — ABNORMAL HIGH (ref <150)

## 2020-08-07 MED ORDER — CHLORHEXIDINE GLUCONATE 0.12 % MT SOLN
OROMUCOSAL | Status: AC
  Filled 2020-08-07: qty 15

## 2020-08-07 MED ORDER — TRACE MINERALS CU-MN-SE-ZN 300-55-60-3000 MCG/ML IV SOLN
INTRAVENOUS | Status: AC
  Filled 2020-08-07: qty 528

## 2020-08-07 MED ORDER — FUROSEMIDE 10 MG/ML IJ SOLN
20.0000 mg | Freq: Once | INTRAMUSCULAR | Status: AC
  Administered 2020-08-07: 20 mg via INTRAVENOUS
  Filled 2020-08-07: qty 2

## 2020-08-07 NOTE — Progress Notes (Signed)
CRITICAL CARE PROGRESS NOTE    Name: Brenda Anderson MRN: [id number] DOB: 01/20/1971     LOS: 6   SUBJECTIVE FINDINGS & SIGNIFICANT EVENTS    Patient description:  49 y.o.femalewith a history of ALS, PEG tube, tracheostomy brought in by nurse because of abdominal distention.   08/06/20- patient is tearful during evaluation this am.  Discussed care plan with surgery team, patient developed pneumoperitoneum.  08/07/20- patient has edema x 4 extermities with mild pain, will diurese today Lines/tubes : PICC Double Lumen 08/02/20 PICC Right Brachial 39 cm 1 cm (Active)  Indication for Insertion or Continuance of Line Prolonged intravenous therapies 08/05/20 2000  Exposed Catheter (cm) 1 cm 08/02/20 1600  Site Assessment Clean;Dry;Intact 08/05/20 2000  Lumen #1 Status Infusing;Flushed;Blood return noted 08/05/20 2000  Lumen #2 Status Saline locked;Flushed;Blood return noted 08/05/20 2000  Dressing Type Transparent;Occlusive 08/05/20 2000  Dressing Status Clean;Dry;Intact;Antimicrobial disc in place 08/05/20 2000  Safety [place] Not Applicable 81/85/63 1497  Line Care Connections checked and tightened 08/05/20 2000  Line Adjustment (NICU/IV Team Only) No 08/04/20 2000  Dressing Intervention New dressing 08/02/20 1600  Dressing Change Due 08/09/20 08/05/20 2000     Gastrostomy/Enterostomy Percutaneous endoscopic gastrostomy (PEG) RUQ (Active)  Surrounding Skin Dry;Intact 08/06/20 0400  Tube Status Open to gravity drainage 08/06/20 0400  Drainage Appearance Bile;Green 08/06/20 0400  Dressing Status Clean;Dry;Intact 08/06/20 0400  Dressing Intervention Dressing changed 08/06/20 0400  Dressing Type Split gauze 08/06/20 0400  Dressing Change Due 08/06/20 08/06/20 0400  Output (mL) 300 mL 08/06/20 0356     Urethral  Catheter Urologist Coude;Non-latex (Active)  Indication for Insertion or Continuance of Catheter Chronic catheter use 08/05/20 2000  Site Assessment Clean;Intact 08/05/20 2000  Catheter Maintenance Bag below level of bladder;Catheter secured;Drainage bag/tubing not touching floor;Insertion date on drainage bag;No dependent loops;Seal intact 08/05/20 2000  Collection Container Standard drainage bag 08/05/20 2000  Securement Method Securing device (Describe) 08/05/20 2000  Urinary Catheter Interventions (if applicable) Unclamped 02/63/78 2000  Output (mL) 225 mL 08/06/20 0356    Microbiology/Sepsis markers: Results for orders placed or performed during the hospital encounter of 08/01/20  SARS Coronavirus 2 by RT PCR (hospital order, performed in [facility] hospital lab) Nasopharyngeal Nasopharyngeal Swab     Status: None   Collection Time: 08/01/20  3:47 PM   Specimen: Nasopharyngeal Swab  Result Value Ref Range Status   SARS Coronavirus 2 NEGATIVE NEGATIVE Final    Comment: (NOTE) SARS-CoV-2 target nucleic acids are NOT DETECTED.  The SARS-CoV-2 RNA is generally detectable in upper and lower respiratory specimens during the acute phase of infection. The lowest concentration of SARS-CoV-2 viral copies this assay can detect is 250 copies / mL. A negative result does not preclude SARS-CoV-2 infection and should not be used as the sole basis for treatment or other patient management decisions.  A negative result may occur with improper specimen collection / handling, submission of specimen other than nasopharyngeal swab, presence of viral mutation(s) within the areas targeted by this assay, and inadequate number of viral copies (<250 copies / mL). A negative result must be combined with clinical observations, patient history, and epidemiological information.  Fact Sheet for Patients:   StrictlyIdeas.no  Fact Sheet for Healthcare  Providers: BankingDealers.co.za  This test is not yet approved or  cleared by the [place] FDA and has been authorized for detection and/or diagnosis of SARS-CoV-2 by FDA under an Emergency Use Authorization (EUA).  This EUA will remain in effect (meaning this  test can be used) for the duration of the COVID-19 declaration under Section 564(b)(1) of the Act, 21 U.S.C. section 360bbb-3(b)(1), unless the authorization is terminated or revoked sooner.  Performed at [facility], [address] [place], [place] [address]   CULTURE, BLOOD (ROUTINE X 2) w Reflex to ID Panel     Status: None   Collection Time: 08/01/20  5:42 PM   Specimen: BLOOD  Result Value Ref Range Status   Specimen Description BLOOD BLOOD LEFT HAND  Final   Special Requests   Final    BOTTLES DRAWN AEROBIC AND ANAEROBIC Blood Culture adequate volume   Culture   Final    NO GROWTH 5 DAYS Performed at [facility], [address] [place], [place] [address]    Report Status 08/06/2020 FINAL  Final  MRSA PCR Screening     Status: None   Collection Time: 08/01/20  5:55 PM   Specimen: Nasopharyngeal  Result Value Ref Range Status   MRSA by PCR NEGATIVE NEGATIVE Final    Comment:        The GeneXpert MRSA Assay (FDA approved for NASAL specimens only), is one component of a comprehensive MRSA colonization surveillance program. It is not intended to diagnose MRSA infection nor to guide or monitor treatment for MRSA infections. Performed at [facility], [address] [place], [place] [address]   CULTURE, BLOOD (ROUTINE X 2) w Reflex to ID Panel     Status: None   Collection Time: 08/01/20  6:04 PM   Specimen: BLOOD  Result Value Ref Range Status   Specimen Description BLOOD BLOOD RIGHT HAND  Final   Special Requests   Final    BOTTLES DRAWN AEROBIC AND ANAEROBIC Blood Culture adequate volume   Culture   Final    NO GROWTH 5 DAYS Performed at [facility], [address] [place], [place] [address]    Report Status 08/06/2020 FINAL  Final  Culture, respiratory (non-expectorated)     Status: None (Preliminary result)   Collection Time: 08/06/20 12:29 PM   Specimen: Tracheal Aspirate; Respiratory  Result Value Ref Range Status   Specimen Description   Final    TRACHEAL ASPIRATE Performed at [facility], [address] [place], [place] [address]    Special Requests   Final    NONE Performed at [facility], [address] [place], [place] [address]    Gram Stain   Final    FEW WBC PRESENT, PREDOMINANTLY PMN MODERATE GRAM VARIABLE ROD    Culture   Final    TOO YOUNG TO READ Performed at [facility], [address], [place], [place] [address]    Report Status PENDING  Incomplete    Anti-infectives:  Anti-infectives (From admission, onward)   Start     Dose/Rate Route Frequency Ordered Stop   08/02/20 0000  piperacillin-tazobactam (ZOSYN) IVPB 3.375 g        3.375 g 12.5 mL/hr over 240 Minutes Intravenous Every 8 hours 08/01/20 2210     08/01/20 1530  piperacillin-tazobactam (ZOSYN) IVPB 3.375 g        3.375 g 100 mL/hr over 30 Minutes Intravenous  Once 08/01/20 1524 08/01/20 1653       Consults: Treatment Team:  Leafy Ro, MD Riki Altes, MD     PAST MEDICAL HISTORY   Past Medical History:  Diagnosis Date  . ALS (amyotrophic lateral sclerosis) (HCC)   . PEG (percutaneous  endoscopic gastrostomy) status ([place])   . Tracheostomy dependence ([place])      SURGICAL HISTORY   History reviewed. No pertinent surgical history.   FAMILY HISTORY   No family history on file.   SOCIAL HISTORY   Social History   Tobacco Use  . Smoking status: Former Smoker    Packs/day: 1.00    Years: 30.00    Pack years: 30.00    Types: Cigarettes    Quit date: 12/11/2012    Years since quitting: 7.6  . Smokeless tobacco: Never Used  Substance Use Topics  . Alcohol use: No  .  Drug use: No     MEDICATIONS   Current Medication:  Current Facility-Administered Medications:  .  baclofen (LIORESAL) tablet 10 mg, 10 mg, Per Tube, TID, Earlie Counts, NP, 10 mg at 08/07/20 1636 .  buPROPion [facility]) tablet 100 mg, 100 mg, Per Tube, TID, Earlie Counts, NP, 100 mg at 08/07/20 1636 .  chlorhexidine gluconate (MEDLINE KIT) (PERIDEX) 0.12 % solution 15 mL, 15 mL, Mouth Rinse, BID, Kasa, Kurian, MD, 15 mL at 08/07/20 0741 .  Chlorhexidine Gluconate Cloth 2 % PADS 6 each, 6 each, Topical, Daily, Flora Lipps, MD, 6 each at 08/06/20 2309 .  citalopram (CELEXA) tablet 20 mg, 20 mg, Per Tube, Daily, Mahan, Kasie J, NP, 20 mg at 08/07/20 1102 .  enoxaparin (LOVENOX) injection 40 mg, 40 mg, Subcutaneous, Q24H, Kasa, Kurian, MD, 40 mg at 08/06/20 2308 .  famotidine (PEPCID) 40 MG/5ML suspension 20 mg, 20 mg, Per Tube, BID, Mortimer Fries, Kurian, MD, 20 mg at 08/07/20 1101 .  fluticasone (FLONASE) 50 MCG/ACT nasal spray 1 spray, 1 spray, Each Nare, Daily, Mahan, Kasie J, NP, 1 spray at 08/07/20 1101 .  HYDROcodone-acetaminophen (HYCET) 7.5-325 mg/15 ml solution 10 mL, 10 mL, Oral, Q4H PRN, Tukov-Yual, Magdalene S, NP, 10 mL at 08/01/20 2046 .  MEDLINE mouth rinse, 15 mL, Mouth Rinse, 10 times per day, Flora Lipps, MD, 15 mL at 08/07/20 1738 .  melatonin tablet 7.5 mg, 7.5 mg, Per Tube, QHS, Kasa, Kurian, MD, 7.5 mg at 08/06/20 2314 .  montelukast (SINGULAIR) tablet 10 mg, 10 mg, Per Tube, QHS, Mahan, Wayna Chalet, NP, 10 mg at 08/06/20 2307 .  morphine 2 MG/ML injection 2 mg, 2 mg, Intravenous, Once, Carrie Mew, MD .  morphine 2 MG/ML injection 2 mg, 2 mg, Intravenous, Q1H PRN, Flora Lipps, MD, 2 mg at 08/07/20 1722 .  naphazoline-glycerin (CLEAR EYES REDNESS) ophth solution 1-2 drop, 1-2 drop, Both Eyes, QID PRN, Flora Lipps, MD .  ondansetron (ZOFRAN) injection 4 mg, 4 mg, Intravenous, Q6H PRN, Darel Hong D, NP, 4 mg at 08/03/20 2141 .  piperacillin-tazobactam (ZOSYN) IVPB  3.375 g, 3.375 g, Intravenous, Q8H, Tukov-Yual, Magdalene S, NP, Last Rate: 12.5 mL/hr at 08/07/20 1633, 3.375 g at 08/07/20 1633 .  polyethylene glycol (MIRALAX / GLYCOLAX) packet 17 g, 17 g, Per Tube, Daily PRN, Earlie Counts, NP .  pregabalin (LYRICA) capsule 100 mg, 100 mg, Per Tube, BID, Mariana Kaufman J, NP, 100 mg at 08/07/20 1102 .  pregabalin (LYRICA) capsule 25 mg, 25 mg, Per Tube, Daily PRN, Earlie Counts, NP .  promethazine (PHENERGAN) injection 12.5 mg, 12.5 mg, Intravenous, Q4H PRN, Flora Lipps, MD, 12.5 mg at 08/04/20 0448 .  sodium chloride flush (NS) 0.9 % injection 10-40 mL, 10-40 mL, Intracatheter, Q12H, Kasa, Kurian, MD, 10 mL at 08/07/20 1102 .  sodium chloride flush (NS) 0.9 % injection 10-40 mL, 10-40  mL, Intracatheter, PRN, Flora Lipps, MD .  sodium phosphate (FLEET) 7-19 GM/118ML enema 1 enema, 1 enema, Rectal, Daily PRN, Ronny Bacon, MD, 1 enema at 08/03/20 1300 .  TPN ADULT (ION), , Intravenous, Continuous TPN, Ottie Glazier, MD, Last Rate: 55 mL/hr at 08/07/20 1802, New Bag at 08/07/20 1802    ALLERGIES   Ciprofloxacin, Other, and Ultram [tramadol hcl]    REVIEW OF SYSTEMS     Unable to obtain due to acutely ill status with trache and sepsis.  PHYSICAL EXAMINATION   Vital Signs: Temp:  [96.9 F (36.1 C)-98.4 F (36.9 C)] 98.4 F (36.9 C) (08/28 1600) Pulse Rate:  [81-122] 103 (08/28 1800) Resp:  [12-20] 12 (08/28 1800) BP: (73-132)/(47-78) 121/76 (08/28 1800) SpO2:  [88 %-98 %] 93 % (08/28 1800) FiO2 (%):  [28 %] 28 % (08/28 1718) Weight:  [71.7 kg] 71.7 kg (08/28 0500)  GENERAL:chronically ill apprearing age appropriate HEAD: Normocephalic, atraumatic.  EYES: Pupils equal, round, reactive to light.  No scleral icterus.  MOUTH: Moist mucosal membrane. NECK: Supple. No thyromegaly. No nodules. No JVD.  PULMONARY: NIV respiratory sounds without wheezing /crackles CARDIOVASCULAR: S1 and S2. Regular rate and rhythm. No murmurs, rubs, or  gallops.  GASTROINTESTINAL: Soft, nontender, non-distended. No masses. Positive bowel sounds. No hepatosplenomegaly.  MUSCULOSKELETAL: No swelling, clubbing, or edema.  NEUROLOGIC: Mild distress due to acute illness SKIN:intact,warm,dry   PERTINENT DATA     Infusions: . piperacillin-tazobactam (ZOSYN)  IV 3.375 g (08/07/20 1633)  . TPN ADULT (ION) 55 mL/hr at 08/07/20 1802   Scheduled Medications: . baclofen  10 mg Per Tube TID  . buPROPion  100 mg Per Tube TID  . chlorhexidine gluconate (MEDLINE KIT)  15 mL Mouth Rinse BID  . Chlorhexidine Gluconate Cloth  6 each Topical Daily  . citalopram  20 mg Per Tube Daily  . enoxaparin (LOVENOX) injection  40 mg Subcutaneous Q24H  . famotidine  20 mg Per Tube BID  . fluticasone  1 spray Each Nare Daily  . mouth rinse  15 mL Mouth Rinse 10 times per day  . melatonin  7.5 mg Per Tube QHS  . montelukast  10 mg Per Tube QHS  .  morphine injection  2 mg Intravenous Once  . pregabalin  100 mg Per Tube BID  . sodium chloride flush  10-40 mL Intracatheter Q12H   PRN Medications: HYDROcodone-acetaminophen, morphine injection, naphazoline-glycerin, ondansetron (ZOFRAN) IV, polyethylene glycol, pregabalin, promethazine, sodium chloride flush, sodium phosphate Hemodynamic parameters:   Intake/Output: 08/27 0701 - 08/28 0700 In: 2442.6 [I.V.:1727.6; IV Piggyback:715.1] Out: 2175 [Urine:1725; Drains:450]  Ventilator  Settings: Vent Mode: AC FiO2 (%):  [28 %] 28 % Set Rate:  [12 bmp] 12 bmp Vt Set:  [450 mL] 450 mL PEEP:  [5 cmH20] 5 cmH20     LAB RESULTS:  Basic Metabolic Panel: Recent Labs  Lab 08/03/20 0750 08/03/20 2121 08/04/20 0447 08/04/20 0447 08/05/20 0532 08/05/20 0532 08/06/20 0315 08/06/20 0315 08/06/20 1700 08/07/20 0621  NA 143  --  146*  --  148*  --  142  --   --  141  K 2.7*   < > 3.2*   < > 3.0*   < > 2.8*   < > 3.8 3.8  CL 106  --  111  --  119*  --  110  --   --  108  CO2 17*  --  16*  --  17*  --  24  --    --  28  GLUCOSE 60*  --  71  --  58*  --  153*  --   --  175*  BUN 24*  --  19  --  13  --  7  --   --  7  CREATININE 0.49  --  0.45  --  0.46  --  <0.30*  --   --  <0.30*  CALCIUM 9.2  --  8.6*  --  9.1  --  8.4*  --   --  8.4*  MG  --   --  2.2  --  2.2  --  1.8  --  2.3 2.4  PHOS  --   --  2.9  --  1.3*  --  1.5*  --  4.0 2.9   < > = values in this interval not displayed.   Liver Function Tests: Recent Labs  Lab 08/01/20 1346 08/07/20 0621  AST 22 13*  ALT 17 11  ALKPHOS 95 63  BILITOT 0.7 0.6  PROT 8.0 5.8*  ALBUMIN 4.0 2.7*   Recent Labs  Lab 08/01/20 1346  LIPASE 26   No results for input(s): AMMONIA in the last 168 hours. CBC: Recent Labs  Lab 08/03/20 0750 08/04/20 0447 08/05/20 0532 08/06/20 0315 08/07/20 0621  WBC 17.9* 19.1* 12.9* 9.5 8.2  NEUTROABS  --   --   --   --  5.5  HGB 12.7 12.6 12.2 11.9* 11.9*  HCT 40.8 40.2 39.4 36.4 35.9*  MCV 100.0 102.6* 100.8* 95.3 95.7  PLT 299 298 275 254 251   Cardiac Enzymes: No results for input(s): CKTOTAL, CKMB, CKMBINDEX, TROPONINI in the last 168 hours. BNP: Invalid input(s): POCBNP CBG: Recent Labs  Lab 08/06/20 2359 08/07/20 0427 08/07/20 0728 08/07/20 1123 08/07/20 1532  GLUCAP 127* 71 126* 131* 139*       IMAGING RESULTS:  Imaging: DG Abd Portable 2V  Result Date: 08/06/2020 CLINICAL DATA:  Diverticulitis.  Follow-up. EXAM: PORTABLE ABDOMEN - 2 VIEW COMPARISON:  CT abdomen done yesterday. FINDINGS: Previously administered contrast now present throughout the colon. Gastrostomy tube in place. No sign of bowel obstruction. No visible free air. Curvature and chronic degenerative changes of the spine. IMPRESSION: Previously administered contrast now present throughout the colon. No sign of bowel obstruction or free air. Electronically Signed   By: Nelson Chimes M.D.   On: 08/06/2020 11:48   _0 @ No results found.    ASSESSMENT AND PLAN    -Multidisciplinary rounds held today   Sepsis  with multi organ dysfunction syndrome   - Present on admission    - due to intra-abdominal infection secondary to diverticulitis complicated by pneumoperitoneum   - trend lactate -use vasopressors to keep MAP>65 -follow ABG and LA -follow up cultures -emperic ABX-on zosyn due to severity of illness -patient not on vasopressors currently  -LR at 125/hr will decrase to 75/hr at this time -surgery on case appreciate input   Amyotrophic Lateral Sclerosis   - advanced stage   - poor prognosis   - Palliative care consultation ordered   Ventilator dependent respiratory failure  - 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  -proned to recurrent pneumonia  -Tracheal aspirate   - NIV trilogy 100 - Leak 20L/min, Vt 450-500,   5cmH20>20cmH20   Right pleural effusion   - no need for drainage at this time  - decrease IVF 125cc/h>>25cc/hr while septic once euvolemic d/c IVF   Severe protein calorie malnutrition   - low albumin   - bitemporal  wasting and severe sarcopenia   -Nutritional assessment  ID -continue IV abx as prescibed -follow up cultures  GI/Nutrition GI PROPHYLAXIS as indicated DIET-->TF's as tolerated Constipation protocol as indicated  ENDO - ICU hypoglycemic\Hyperglycemia protocol -check FSBS per protocol   ELECTROLYTES -follow labs as needed -replace as needed -pharmacy consultation   DVT/GI PRX ordered -SCDs  TRANSFUSIONS AS NEEDED MONITOR FSBS ASSESS the need for LABS as needed   Critical care provider statement:    Critical care time (minutes):  33   Critical care time was exclusive of:  Separately billable procedures and treating other patients   Critical care was necessary to treat or prevent imminent or life-threatening deterioration of the following conditions:  sepsis due to diverticulitis   Critical care was time spent personally by me on the following activities:  Development of treatment plan with patient or surrogate, discussions with consultants, evaluation  of patient's response to treatment, examination of patient, obtaining history from patient or surrogate, ordering and performing treatments and interventions, ordering and review of laboratory studies and re-evaluation of patient's condition.  I assumed direction of critical care for this patient from another provider in my specialty: no    This document was prepared using Dragon voice recognition software and may include unintentional dictation errors.    Ottie Glazier, M.D.  Division of [place]

## 2020-08-07 NOTE — Progress Notes (Signed)
Pts HR has remained NSR-ST sometimes getting in the 130's when suctioned. Still on home vent Trach O2 has remained above 88%. Still Alert and follows commands. This afternoon went in to pts room and fingers and hands were noticeably swollen, red and warm to the touch. Md notified and assessed. One time dose of Lasix 20 given. Pt has remained Afebrile. Adequate urine output. Mother updated. Will continue to monitor.

## 2020-08-07 NOTE — Progress Notes (Signed)
PHARMACY - TOTAL PARENTERAL NUTRITION CONSULT NOTE   Indication: prolonged inability for tube feeds  Patient Measurements: Height: 5' 5.98" (167.6 cm) Weight: 71.7 kg (158 lb 1.1 oz) IBW/kg (Calculated) : 59.26 TPN AdjBW (KG): 71.1 Body mass index is 25.53 kg/m.  Assessment: 49 year old female admitted with diverticulitis. Plan was to possibly restart tube feeds after holding for several days. Repeat CT A/P with increase in pneumoperitoneum although patient clinically improving. Plan to continue conservative management for now. Patient to start on TPN as tube feeds will continue to be held.  Glucose / Insulin: episodes of hypoglycemia, on D5LR Electrolytes: hypokalemia, hypophosphatemia - replacing Renal: WNL LFTs / TGs: WNL Prealbumin / albumin: albumin 4 MIVF: D5LR at 75 ml/hr GI Imaging: CT A/P w/ increase in pneumoperitoneum  Central access: PICC TPN start date: 8/27  Nutritional Goals (per RD recommendation on 8/27): kCal: 1420, Protein: 70-85 g/day, Fluid: 1.2-1.4 L/day Goal TPN rate is ~55 mL/hr (provides 81 g of protein and 1420 kcals per day)  Current Nutrition: NPO  Plan:  Will increase TPN to goal rate on 55 mL/hr Electrolytes in TPN: 50 mEq/L of Na, 50 mEq/L of K, 5 mEq/L of Ca, 5 mEq/L of Mg, and 15 mmol/L of Phos. Cl:Ac ratio 1:1 Add standard MVI and trace elements to TPN Issues with hypoglycemia - no insulin at this time  Will stop MIVF Monitor TPN labs on Mon/Thurs, daily for now s/t electrolyte abnormalities  Ronnald Ramp, PharmD, BCPS 08/07/2020,9:30 AM

## 2020-08-07 NOTE — Progress Notes (Signed)
Subjective:  CC: Brenda Anderson is a 49 y.o. female  Hospital stay day 6,   uncomplicated diverticulitis  HPI: Episodes of hypotension overnight, but stablized by this am.   ROS:  Unable to obtain secondary to patient satatus  Objective:   Temp:  [96.9 F (36.1 C)-97.7 F (36.5 C)] 97.5 F (36.4 C) (08/27 2330) Pulse Rate:  [72-112] 112 (08/28 0330) Resp:  [12-17] 12 (08/28 0330) BP: (73-130)/(47-78) 93/62 (08/28 0330) SpO2:  [95 %-98 %] 98 % (08/28 0339) FiO2 (%):  [28 %] 28 % (08/28 0339)     Height: 5' 5.98" (167.6 cm) Weight: 71.1 kg BMI (Calculated): 25.31   Intake/Output this shift:   Intake/Output Summary (Last 24 hours) at 08/07/2020 0407 Last data filed at 08/07/2020 0200 Gross per 24 hour  Intake 2336.53 ml  Output 1525 ml  Net 811.53 ml    Constitutional :  alert, cooperative, appears stated age and no distress  Respiratory:  clear to auscultation bilaterally  Cardiovascular:  regular rate and rhythm  Gastrointestinal: soft, non-tender; bowel sounds normal; no masses,  no organomegaly.   Skin: Cool and moist.   Psychiatric: Normal affect, non-agitated, not confused       LABS:  CMP Latest Ref Rng & Units 08/06/2020 08/06/2020 08/05/2020  Glucose 70 - 99 mg/dL - 209(O) 70(J)  BUN 6 - 20 mg/dL - 7 13  Creatinine 6.28 - 1.00 mg/dL - <3.66(Q) 9.47  Sodium 135 - 145 mmol/L - 142 148(H)  Potassium 3.5 - 5.1 mmol/L 3.8 2.8(L) 3.0(L)  Chloride 98 - 111 mmol/L - 110 119(H)  CO2 22 - 32 mmol/L - 24 17(L)  Calcium 8.9 - 10.3 mg/dL - 6.5(Y) 9.1  Total Protein 6.5 - 8.1 g/dL - - -  Total Bilirubin 0.3 - 1.2 mg/dL - - -  Alkaline Phos 38 - 126 U/L - - -  AST 15 - 41 U/L - - -  ALT 0 - 44 U/L - - -   CBC Latest Ref Rng & Units 08/06/2020 08/05/2020 08/04/2020  WBC 4.0 - 10.5 K/uL 9.5 12.9(H) 19.1(H)  Hemoglobin 12.0 - 15.0 g/dL 11.9(L) 12.2 12.6  Hematocrit 36 - 46 % 36.4 39.4 40.2  Platelets 150 - 400 K/uL 254 275 298    RADS: n/a Assessment:   49 y.o. female  with hypotension overnight responded to IVF bolus o/w hemodynamically stable admitted with acute uncomplicated diverticulitis found to have increase in pneumoperitoneum on CT A/P (08/26) although she is otherwise clinically improving and without overt evidence of peritonitis or sepsis.   Abdominal exam with no signs of peritonitis, possible TTP noted in RLQ/suprapubic region, with patient facial twitches noted with palpation.  She could not definitively answer yes or no when asked if she is tender in the area.  Recommend continuing current management, hold off any oral feeds for now.

## 2020-08-08 LAB — BASIC METABOLIC PANEL
Anion gap: 9 (ref 5–15)
BUN: 14 mg/dL (ref 6–20)
CO2: 30 mmol/L (ref 22–32)
Calcium: 8.5 mg/dL — ABNORMAL LOW (ref 8.9–10.3)
Chloride: 102 mmol/L (ref 98–111)
Creatinine, Ser: <0.3 mg/dL — ABNORMAL LOW (ref 0.44–1.00)
Glucose, Bld: 158 mg/dL — ABNORMAL HIGH (ref 70–99)
Potassium: 3.3 mmol/L — ABNORMAL LOW (ref 3.5–5.1)
Sodium: 141 mmol/L (ref 135–145)

## 2020-08-08 LAB — GLUCOSE, CAPILLARY
Glucose-Capillary: 145 mg/dL — ABNORMAL HIGH (ref 70–99)
Glucose-Capillary: 165 mg/dL — ABNORMAL HIGH (ref 70–99)
Glucose-Capillary: 166 mg/dL — ABNORMAL HIGH (ref 70–99)
Glucose-Capillary: 174 mg/dL — ABNORMAL HIGH (ref 70–99)
Glucose-Capillary: 179 mg/dL — ABNORMAL HIGH (ref 70–99)
Glucose-Capillary: 190 mg/dL — ABNORMAL HIGH (ref 70–99)

## 2020-08-08 LAB — MAGNESIUM: Magnesium: 2.2 mg/dL (ref 1.7–2.4)

## 2020-08-08 LAB — PHOSPHORUS: Phosphorus: 1.8 mg/dL — ABNORMAL LOW (ref 2.5–4.6)

## 2020-08-08 MED ORDER — TRACE MINERALS CU-MN-SE-ZN 300-55-60-3000 MCG/ML IV SOLN
INTRAVENOUS | Status: AC
  Filled 2020-08-08: qty 528

## 2020-08-08 MED ORDER — GLYCOPYRROLATE 0.2 MG/ML IJ SOLN
0.1000 mg | Freq: Once | INTRAMUSCULAR | Status: AC
  Administered 2020-08-08: 0.1 mg via INTRAVENOUS
  Filled 2020-08-08: qty 1

## 2020-08-08 MED ORDER — POTASSIUM CHLORIDE 10 MEQ/100ML IV SOLN
10.0000 meq | INTRAVENOUS | Status: AC
  Administered 2020-08-08 (×4): 10 meq via INTRAVENOUS
  Filled 2020-08-08 (×4): qty 100

## 2020-08-08 MED ORDER — POTASSIUM PHOSPHATES 15 MMOLE/5ML IV SOLN
20.0000 mmol | Freq: Once | INTRAVENOUS | Status: AC
  Administered 2020-08-08: 20 mmol via INTRAVENOUS
  Filled 2020-08-08: qty 6.67

## 2020-08-08 NOTE — Progress Notes (Signed)
Subjective:  CC: Brenda Anderson is a 49 y.o. female  Hospital stay day 7,   uncomplicated diverticulitis  HPI: No issues reported overnight.  ROS:  Unable to obtain secondary to patient satatus  Objective:   Temp:  [97.6 F (36.4 C)-98.9 F (37.2 C)] 98.8 F (37.1 C) (08/29 0800) Pulse Rate:  [89-122] 105 (08/29 1000) Resp:  [12-25] 15 (08/29 1000) BP: (90-132)/(52-79) 109/68 (08/29 1000) SpO2:  [88 %-95 %] 94 % (08/29 1000) FiO2 (%):  [28 %] 28 % (08/29 0802) Weight:  [71.2 kg] 71.2 kg (08/29 0500)     Height: 5' 5.98" (167.6 cm) Weight: 71.2 kg BMI (Calculated): 25.35   Intake/Output this shift:   Intake/Output Summary (Last 24 hours) at 08/08/2020 1034 Last data filed at 08/08/2020 0900 Gross per 24 hour  Intake 1956.15 ml  Output 2900 ml  Net -943.85 ml    Constitutional :  alert, cooperative, appears stated age and no distress  Respiratory:  clear to auscultation bilaterally  Cardiovascular:  regular rate and rhythm  Gastrointestinal: soft, non-tender; bowel sounds normal; no masses,  no organomegaly.   Skin: Cool and moist.   Psychiatric: Normal affect, non-agitated, not confused       LABS:  CMP Latest Ref Rng & Units 08/08/2020 08/07/2020 08/06/2020  Glucose 70 - 99 mg/dL 295(M) 841(L) -  BUN 6 - 20 mg/dL 14 7 -  Creatinine 2.44 - 1.00 mg/dL <0.10(U) <7.25(D) -  Sodium 135 - 145 mmol/L 141 141 -  Potassium 3.5 - 5.1 mmol/L 3.3(L) 3.8 3.8  Chloride 98 - 111 mmol/L 102 108 -  CO2 22 - 32 mmol/L 30 28 -  Calcium 8.9 - 10.3 mg/dL 6.6(Y) 4.0(H) -  Total Protein 6.5 - 8.1 g/dL - 5.8(L) -  Total Bilirubin 0.3 - 1.2 mg/dL - 0.6 -  Alkaline Phos 38 - 126 U/L - 63 -  AST 15 - 41 U/L - 13(L) -  ALT 0 - 44 U/L - 11 -   CBC Latest Ref Rng & Units 08/07/2020 08/06/2020 08/05/2020  WBC 4.0 - 10.5 K/uL 8.2 9.5 12.9(H)  Hemoglobin 12.0 - 15.0 g/dL 11.9(L) 11.9(L) 12.2  Hematocrit 36 - 46 % 35.9(L) 36.4 39.4  Platelets 150 - 400 K/uL 251 254 275     RADS: n/a Assessment:   49 y.o. female with hypotension overnight responded to IVF bolus o/w hemodynamically stable admitted with acute uncomplicated diverticulitis found to have increase in pneumoperitoneum on CT A/P (08/26) although she is otherwise clinically improving and without overt evidence of peritonitis or sepsis.   Abdominal exam unchanged from yesterday.  She was able to mouth "no"  When asked about pain today during abdominal exam.  Labs and vitals continue to be reassuring   Will resume clears through gtube and see how she tolerates.  Further care per ICU team, which was notified of plan and in agreement

## 2020-08-08 NOTE — Progress Notes (Signed)
PHARMACY - TOTAL PARENTERAL NUTRITION CONSULT NOTE   Indication: prolonged inability for tube feeds  Patient Measurements: Height: 5' 5.98" (167.6 cm) Weight: 71.2 kg (156 lb 15.5 oz) IBW/kg (Calculated) : 59.26 TPN AdjBW (KG): 71.1 Body mass index is 25.35 kg/m.  Assessment: 49 year old female admitted with diverticulitis. Plan was to possibly restart tube feeds after holding for several days. Repeat CT A/P with increase in pneumoperitoneum although patient clinically improving. Plan to continue conservative management for now. Patient to start on TPN as tube feeds will continue to be held.  Glucose / Insulin: episodes of hypoglycemia, on D5LR Electrolytes: hypokalemia, hypophosphatemia - replacing Renal: WNL LFTs / TGs: WNL Prealbumin / albumin: albumin 4 MIVF: D5LR at 75 ml/hr GI Imaging: CT A/P w/ increase in pneumoperitoneum  Central access: PICC TPN start date: 8/27  Nutritional Goals (per RD recommendation on 8/27): kCal: 1420, Protein: 70-85 g/day, Fluid: 1.2-1.4 L/day Goal TPN rate is ~55 mL/hr (provides 81 g of protein and 1420 kcals per day)  Current Nutrition: NPO  Plan:  Will increase TPN to goal rate on 55 mL/hr Electrolytes in TPN: 50 mEq/L of Na, 50 mEq/L of K, 5 mEq/L of Ca, 5 mEq/L of Mg, and 15 mmol/L of Phos. Cl:Ac ratio 1:1 Add standard MVI and trace elements to TPN Will add Kphos 20 mmol IV x 1 and KCl 10 mEq x 4.  Issues with hypoglycemia - no insulin at this time  Will stop MIVF Monitor TPN labs on Mon/Thurs, daily for now s/t electrolyte abnormalities  Ronnald Ramp, PharmD, BCPS 08/08/2020,9:57 AM

## 2020-08-08 NOTE — Progress Notes (Signed)
CRITICAL CARE PROGRESS NOTE    Name: Brenda Anderson MRN: [id number] DOB: [birth year]     LOS: 7   SUBJECTIVE FINDINGS & SIGNIFICANT EVENTS    Patient description:  49 y.o.femalewith a history of ALS, PEG tube, tracheostomy brought in by nurse because of abdominal distention.   08/06/20- patient is tearful during evaluation this am.  Discussed care plan with surgery team, patient developed pneumoperitoneum.  08/07/20- patient has edema x 4 extermities with mild pain, will diurese today 8/29- patient has less respiratory secretion post glyccopyrolate.  Edema has improved.    Lines/tubes : PICC Double Lumen 08/02/20 PICC Right Brachial 39 cm 1 cm (Active)  Indication for Insertion or Continuance of Line Prolonged intravenous therapies 08/05/20 2000  Exposed Catheter (cm) 1 cm 08/02/20 1600  Site Assessment Clean;Dry;Intact 08/05/20 2000  Lumen #1 Status Infusing;Flushed;Blood return noted 08/05/20 2000  Lumen #2 Status Saline locked;Flushed;Blood return noted 08/05/20 2000  Dressing Type Transparent;Occlusive 08/05/20 2000  Dressing Status Clean;Dry;Intact;Antimicrobial disc in place 08/05/20 2000  Safety [place] Not Applicable 63/87/56 4332  Line Care Connections checked and tightened 08/05/20 2000  Line Adjustment (NICU/IV Team Only) No 08/04/20 2000  Dressing Intervention New dressing 08/02/20 1600  Dressing Change Due 08/09/20 08/05/20 2000     Gastrostomy/Enterostomy Percutaneous endoscopic gastrostomy (PEG) RUQ (Active)  Surrounding Skin Dry;Intact 08/06/20 0400  Tube Status Open to gravity drainage 08/06/20 0400  Drainage Appearance Bile;Green 08/06/20 0400  Dressing Status Clean;Dry;Intact 08/06/20 0400  Dressing Intervention Dressing changed 08/06/20 0400  Dressing Type Split gauze 08/06/20 0400   Dressing Change Due 08/06/20 08/06/20 0400  Output (mL) 300 mL 08/06/20 0356     Urethral Catheter Urologist [place];Non-latex (Active)  Indication for Insertion or Continuance of Catheter Chronic catheter use 08/05/20 2000  Site Assessment Clean;Intact 08/05/20 2000  Catheter Maintenance Bag below level of bladder;Catheter secured;Drainage bag/tubing not touching floor;Insertion date on drainage bag;No dependent loops;Seal intact 08/05/20 2000  Collection Container Standard drainage bag 08/05/20 2000  Securement Method Securing device (Describe) 08/05/20 2000  Urinary Catheter Interventions (if applicable) Unclamped 95/18/84 2000  Output (mL) 225 mL 08/06/20 0356    Microbiology/Sepsis markers: Results for orders placed or performed during the hospital encounter of 08/01/20  SARS Coronavirus 2 by RT PCR (hospital order, performed in [facility] hospital lab) Nasopharyngeal Nasopharyngeal Swab     Status: None   Collection Time: 08/01/20  3:47 PM   Specimen: Nasopharyngeal Swab  Result Value Ref Range Status   SARS Coronavirus 2 NEGATIVE NEGATIVE Final    Comment: (NOTE) SARS-CoV-2 target nucleic acids are NOT DETECTED.  The SARS-CoV-2 RNA is generally detectable in upper and lower respiratory specimens during the acute phase of infection. The lowest concentration of SARS-CoV-2 viral copies this assay can detect is 250 copies / mL. A negative result does not preclude SARS-CoV-2 infection and should not be used as the sole basis for treatment or other patient management decisions.  A negative result may occur with improper specimen collection / handling, submission of specimen other than nasopharyngeal swab, presence of viral mutation(s) within the areas targeted by this assay, and inadequate number of viral copies (<250 copies / mL). A negative result must be combined with clinical observations, patient history, and epidemiological information.  Fact Sheet for Patients:    StrictlyIdeas.no  Fact Sheet for Healthcare Providers: BankingDealers.co.za  This test is not yet approved or  cleared by the [place] FDA and has been authorized for detection and/or diagnosis of SARS-CoV-2 by FDA  under an Emergency Use Authorization (EUA).  This EUA will remain in effect (meaning this test can be used) for the duration of the COVID-19 declaration under Section 564(b)(1) of the Act, 21 U.S.C. section 360bbb-3(b)(1), unless the authorization is terminated or revoked sooner.  Performed at [facility], [address] [place], [place] [address]   CULTURE, BLOOD (ROUTINE X 2) w Reflex to ID Panel     Status: None   Collection Time: 08/01/20  5:42 PM   Specimen: BLOOD  Result Value Ref Range Status   Specimen Description BLOOD BLOOD LEFT HAND  Final   Special Requests   Final    BOTTLES DRAWN AEROBIC AND ANAEROBIC Blood Culture adequate volume   Culture   Final    NO GROWTH 5 DAYS Performed at [facility], [address] [place], [place] [address]    Report Status 08/06/2020 FINAL  Final  MRSA PCR Screening     Status: None   Collection Time: 08/01/20  5:55 PM   Specimen: Nasopharyngeal  Result Value Ref Range Status   MRSA by PCR NEGATIVE NEGATIVE Final    Comment:        The GeneXpert MRSA Assay (FDA approved for NASAL specimens only), is one component of a comprehensive MRSA colonization surveillance program. It is not intended to diagnose MRSA infection nor to guide or monitor treatment for MRSA infections. Performed at [facility], [address] [place], [place] [address]   CULTURE, BLOOD (ROUTINE X 2) w Reflex to ID Panel     Status: None   Collection Time: 08/01/20  6:04 PM   Specimen: BLOOD  Result Value Ref Range Status   Specimen Description BLOOD BLOOD RIGHT HAND  Final   Special Requests   Final    BOTTLES DRAWN AEROBIC AND ANAEROBIC Blood Culture  adequate volume   Culture   Final    NO GROWTH 5 DAYS Performed at [facility], [address] [place], [place] [address]    Report Status 08/06/2020 FINAL  Final  Culture, respiratory (non-expectorated)     Status: None (Preliminary result)   Collection Time: 08/06/20 12:29 PM   Specimen: Tracheal Aspirate; Respiratory  Result Value Ref Range Status   Specimen Description   Final    TRACHEAL ASPIRATE Performed at [facility], [address] [place], [place] [address]    Special Requests   Final    NONE Performed at [facility], [address] [place], [place] [address]    Gram Stain   Final    FEW WBC PRESENT, PREDOMINANTLY PMN MODERATE GRAM VARIABLE ROD Performed at [facility], [place] [address], [place], [place] [address]    Culture ABUNDANT PSEUDOMONAS AERUGINOSA  Final   Report Status PENDING  Incomplete    Anti-infectives:  Anti-infectives (From admission, onward)   Start     Dose/Rate Route Frequency Ordered Stop   08/02/20 0000  piperacillin-tazobactam (ZOSYN) IVPB 3.375 g        3.375 g 12.5 mL/hr over 240 Minutes Intravenous Every 8 hours 08/01/20 2210     08/01/20 1530  piperacillin-tazobactam (ZOSYN) IVPB 3.375 g        3.375 g 100 mL/hr over 30 Minutes Intravenous  Once 08/01/20 1524 08/01/20 1653       Consults: Treatment Team:  Jules Husbands, MD Abbie Sons, MD     PAST MEDICAL HISTORY   Past Medical History:  Diagnosis Date  . ALS (  amyotrophic lateral sclerosis) ([place])   . PEG (percutaneous endoscopic gastrostomy) status ([place])   . Tracheostomy dependence ([place])      SURGICAL HISTORY   History reviewed. No pertinent surgical history.   FAMILY HISTORY   No family history on file.   SOCIAL HISTORY   Social History   Tobacco Use  . Smoking status: Former Smoker    Packs/day: 1.00    Years: 30.00    Pack years: 30.00    Types: Cigarettes    Quit date: 12/11/2012    Years since quitting:  7.6  . Smokeless tobacco: Never Used  Substance Use Topics  . Alcohol use: No  . Drug use: No     MEDICATIONS   Current Medication:  Current Facility-Administered Medications:  .  baclofen (LIORESAL) tablet 10 mg, 10 mg, Per Tube, TID, Earlie Counts, NP, 10 mg at 08/08/20 1541 .  buPROPion [facility]) tablet 100 mg, 100 mg, Per Tube, TID, Earlie Counts, NP, 100 mg at 08/08/20 1541 .  chlorhexidine gluconate (MEDLINE KIT) (PERIDEX) 0.12 % solution 15 mL, 15 mL, Mouth Rinse, BID, Kasa, Kurian, MD, 15 mL at 08/08/20 0817 .  Chlorhexidine Gluconate Cloth 2 % PADS 6 each, 6 each, Topical, Daily, Flora Lipps, MD, 6 each at 08/07/20 2300 .  citalopram (CELEXA) tablet 20 mg, 20 mg, Per Tube, Daily, Mahan, Kasie J, NP, 20 mg at 08/08/20 1017 .  enoxaparin (LOVENOX) injection 40 mg, 40 mg, Subcutaneous, Q24H, Kasa, Kurian, MD, 40 mg at 08/07/20 2300 .  famotidine (PEPCID) 40 MG/5ML suspension 20 mg, 20 mg, Per Tube, BID, Mortimer Fries, Kurian, MD, 20 mg at 08/08/20 1017 .  fluticasone (FLONASE) 50 MCG/ACT nasal spray 1 spray, 1 spray, Each Nare, Daily, Mahan, Kasie J, NP, 1 spray at 08/08/20 1016 .  HYDROcodone-acetaminophen (HYCET) 7.5-325 mg/15 ml solution 10 mL, 10 mL, Oral, Q4H PRN, Tukov-Yual, Magdalene S, NP, 10 mL at 08/01/20 2046 .  MEDLINE mouth rinse, 15 mL, Mouth Rinse, 10 times per day, Flora Lipps, MD, 15 mL at 08/08/20 1541 .  melatonin tablet 7.5 mg, 7.5 mg, Per Tube, QHS, Kasa, Kurian, MD, 7.5 mg at 08/07/20 2300 .  montelukast (SINGULAIR) tablet 10 mg, 10 mg, Per Tube, QHS, Mahan, Kasie J, NP, 10 mg at 08/07/20 2300 .  morphine 2 MG/ML injection 2 mg, 2 mg, Intravenous, Q1H PRN, Flora Lipps, MD, 2 mg at 08/08/20 1459 .  naphazoline-glycerin (CLEAR EYES REDNESS) ophth solution 1-2 drop, 1-2 drop, Both Eyes, QID PRN, Mortimer Fries, Kurian, MD .  ondansetron (ZOFRAN) injection 4 mg, 4 mg, Intravenous, Q6H PRN, Darel Hong D, NP, 4 mg at 08/08/20 1012 .  piperacillin-tazobactam (ZOSYN) IVPB  3.375 g, 3.375 g, Intravenous, Q8H, Tukov-Yual, Magdalene S, NP, Last Rate: 12.5 mL/hr at 08/08/20 1833, Rate Verify at 08/08/20 1833 .  polyethylene glycol (MIRALAX / GLYCOLAX) packet 17 g, 17 g, Per Tube, Daily PRN, Earlie Counts, NP, 17 g at 08/08/20 1017 .  potassium PHOSPHATE 20 mmol in dextrose 5 % 500 mL infusion, 20 mmol, Intravenous, Once, Oswald Hillock, RPH, Last Rate: 85 mL/hr at 08/08/20 1833, Rate Verify at 08/08/20 1833 .  pregabalin (LYRICA) capsule 100 mg, 100 mg, Per Tube, BID, Mahan, Kasie J, NP, 100 mg at 08/08/20 1016 .  pregabalin (LYRICA) capsule 25 mg, 25 mg, Per Tube, Daily PRN, Earlie Counts, NP .  promethazine (PHENERGAN) injection 12.5 mg, 12.5 mg, Intravenous, Q4H PRN, Flora Lipps, MD, 12.5 mg at 08/08/20 0809 .  sodium  chloride flush (NS) 0.9 % injection 10-40 mL, 10-40 mL, Intracatheter, Q12H, Kasa, Kurian, MD, 10 mL at 08/08/20 1018 .  sodium chloride flush (NS) 0.9 % injection 10-40 mL, 10-40 mL, Intracatheter, PRN, Erin Fulling, MD .  sodium phosphate (FLEET) 7-19 GM/118ML enema 1 enema, 1 enema, Rectal, Daily PRN, Campbell Lerner, MD, 1 enema at 08/03/20 1300 .  TPN ADULT (ION), , Intravenous, Continuous TPN, Ronnald Ramp, RPH, Last Rate: 55 mL/hr at 08/08/20 1833, New Bag at 08/08/20 1833    ALLERGIES   Ciprofloxacin, Other, and Ultram [tramadol hcl]    REVIEW OF SYSTEMS     Unable to obtain due to acutely ill status with trache and sepsis.  PHYSICAL EXAMINATION   Vital Signs: Temp:  [97.2 F (36.2 C)-98.9 F (37.2 C)] 97.2 F (36.2 C) (08/29 1553) Pulse Rate:  [89-127] 106 (08/29 1830) Resp:  [12-25] 12 (08/29 1830) BP: (85-126)/(52-79) 90/61 (08/29 1830) SpO2:  [90 %-97 %] 97 % (08/29 1830) FiO2 (%):  [28 %] 28 % (08/29 1526) Weight:  [71.2 kg] 71.2 kg (08/29 0500)  GENERAL:chronically ill apprearing age appropriate HEAD: Normocephalic, atraumatic.  EYES: Pupils equal, round, reactive to light.  No scleral icterus.  MOUTH: Moist  mucosal membrane. NECK: Supple. No thyromegaly. No nodules. No JVD.  PULMONARY: NIV respiratory sounds without wheezing /crackles CARDIOVASCULAR: S1 and S2. Regular rate and rhythm. No murmurs, rubs, or gallops.  GASTROINTESTINAL: Soft, nontender, non-distended. No masses. Positive bowel sounds. No hepatosplenomegaly.  MUSCULOSKELETAL: No swelling, clubbing, or edema.  NEUROLOGIC: Mild distress due to acute illness SKIN:intact,warm,dry   PERTINENT DATA     Infusions: . piperacillin-tazobactam (ZOSYN)  IV 12.5 mL/hr at 08/08/20 1833  . potassium PHOSPHATE IVPB (in mmol) 85 mL/hr at 08/08/20 1833  . TPN ADULT (ION) 55 mL/hr at 08/08/20 1833   Scheduled Medications: . baclofen  10 mg Per Tube TID  . buPROPion  100 mg Per Tube TID  . chlorhexidine gluconate (MEDLINE KIT)  15 mL Mouth Rinse BID  . Chlorhexidine Gluconate Cloth  6 each Topical Daily  . citalopram  20 mg Per Tube Daily  . enoxaparin (LOVENOX) injection  40 mg Subcutaneous Q24H  . famotidine  20 mg Per Tube BID  . fluticasone  1 spray Each Nare Daily  . mouth rinse  15 mL Mouth Rinse 10 times per day  . melatonin  7.5 mg Per Tube QHS  . montelukast  10 mg Per Tube QHS  . pregabalin  100 mg Per Tube BID  . sodium chloride flush  10-40 mL Intracatheter Q12H   PRN Medications: HYDROcodone-acetaminophen, morphine injection, naphazoline-glycerin, ondansetron (ZOFRAN) IV, polyethylene glycol, pregabalin, promethazine, sodium chloride flush, sodium phosphate Hemodynamic parameters:   Intake/Output: 08/28 0701 - 08/29 0700 In: 2021.1 [I.V.:1871.1; IV Piggyback:150.1] Out: 2900 [Urine:2750; Drains:150]  Ventilator  Settings: Vent Mode: AC FiO2 (%):  [28 %] 28 % Set Rate:  [12 bmp] 12 bmp Vt Set:  [450 mL] 450 mL PEEP:  [5 cmH20] 5 cmH20     LAB RESULTS:  Basic Metabolic Panel: Recent Labs  Lab 08/04/20 0447 08/04/20 0447 08/05/20 0532 08/05/20 0532 08/06/20 0315 08/06/20 0315 08/06/20 1700 08/06/20 1700  08/07/20 0621 08/08/20 0627  NA 146*  --  148*  --  142  --   --   --  141 141  K 3.2*   < > 3.0*   < > 2.8*   < > 3.8   < > 3.8 3.3*  CL 111  --  119*  --  110  --   --   --  108 102  CO2 16*  --  17*  --  24  --   --   --  28 30  GLUCOSE 71  --  58*  --  153*  --   --   --  175* 158*  BUN 19  --  13  --  7  --   --   --  7 14  CREATININE 0.45  --  0.46  --  <0.30*  --   --   --  <0.30* <0.30*  CALCIUM 8.6*  --  9.1  --  8.4*  --   --   --  8.4* 8.5*  MG 2.2   < > 2.2  --  1.8  --  2.3  --  2.4 2.2  PHOS 2.9   < > 1.3*  --  1.5*  --  4.0  --  2.9 1.8*   < > = values in this interval not displayed.   Liver Function Tests: Recent Labs  Lab 08/07/20 0621  AST 13*  ALT 11  ALKPHOS 63  BILITOT 0.6  PROT 5.8*  ALBUMIN 2.7*   No results for input(s): LIPASE, AMYLASE in the last 168 hours. No results for input(s): AMMONIA in the last 168 hours. CBC: Recent Labs  Lab 08/03/20 0750 08/04/20 0447 08/05/20 0532 08/06/20 0315 08/07/20 0621  WBC 17.9* 19.1* 12.9* 9.5 8.2  NEUTROABS  --   --   --   --  5.5  HGB 12.7 12.6 12.2 11.9* 11.9*  HCT 40.8 40.2 39.4 36.4 35.9*  MCV 100.0 102.6* 100.8* 95.3 95.7  PLT 299 298 275 254 251   Cardiac Enzymes: No results for input(s): CKTOTAL, CKMB, CKMBINDEX, TROPONINI in the last 168 hours. BNP: Invalid input(s): POCBNP CBG: Recent Labs  Lab 08/07/20 2344 08/08/20 0348 08/08/20 0741 08/08/20 1142 08/08/20 1601  GLUCAP 173* 145* 166* 179* 165*       IMAGING RESULTS:  Imaging: No results found. _0 @ No results found.    ASSESSMENT AND PLAN    -Multidisciplinary rounds held today   Sepsis with multi organ dysfunction syndrome   - Present on admission    - due to intra-abdominal infection secondary to diverticulitis complicated by pneumoperitoneum   - trend lactate -use vasopressors to keep MAP>65 -follow ABG and LA -follow up cultures -emperic ABX-on zosyn due to severity of illness -patient not on  vasopressors currently  -LR at 125/hr will decrase to 75/hr at this time -surgery on case appreciate input   Amyotrophic Lateral Sclerosis   - advanced stage   - poor prognosis   - Palliative care consultation ordered   Ventilator dependent respiratory failure  - 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  -proned to recurrent pneumonia  -Tracheal aspirate   - NIV trilogy 100 - Leak 20L/min, Vt 450-500,   5cmH20>20cmH20   Right pleural effusion   - no need for drainage at this time  - decrease IVF 125cc/h>>25cc/hr while septic once euvolemic d/c IVF   Severe protein calorie malnutrition   - low albumin   - bitemporal wasting and severe sarcopenia   -Nutritional assessment  ID -continue IV abx as prescibed -follow up cultures  GI/Nutrition GI PROPHYLAXIS as indicated DIET-->TF's as tolerated Constipation protocol as indicated  ENDO - ICU hypoglycemic\Hyperglycemia protocol -check FSBS per protocol   ELECTROLYTES -follow labs as needed -replace as needed -pharmacy consultation   DVT/GI PRX ordered -SCDs  TRANSFUSIONS AS NEEDED MONITOR FSBS ASSESS the need for LABS as needed   Critical care provider statement:    Critical care time (minutes):  33   Critical care time was exclusive of:  Separately billable procedures and treating other patients   Critical care was necessary to treat or prevent imminent or life-threatening deterioration of the following conditions:  sepsis due to diverticulitis   Critical care was time spent personally by me on the following activities:  Development of treatment plan with patient or surrogate, discussions with consultants, evaluation of patient's response to treatment, examination of patient, obtaining history from patient or surrogate, ordering and performing treatments and interventions, ordering and review of laboratory studies and re-evaluation of patient's condition.  I assumed direction of critical care for this patient from another provider in my  specialty: no    This document was prepared using Dragon voice recognition software and may include unintentional dictation errors.    Ottie Glazier, M.D.  Division of [place]

## 2020-08-09 ENCOUNTER — Encounter: Payer: Self-pay | Admitting: Internal Medicine

## 2020-08-09 ENCOUNTER — Inpatient Hospital Stay: Payer: Medicare HMO

## 2020-08-09 DIAGNOSIS — A419 Sepsis, unspecified organism: Secondary | ICD-10-CM

## 2020-08-09 LAB — DIFFERENTIAL
Abs Immature Granulocytes: 0.14 10*3/uL — ABNORMAL HIGH (ref 0.00–0.07)
Basophils Absolute: 0.1 10*3/uL (ref 0.0–0.1)
Basophils Relative: 0 %
Eosinophils Absolute: 0.4 10*3/uL (ref 0.0–0.5)
Eosinophils Relative: 3 %
Immature Granulocytes: 1 %
Lymphocytes Relative: 21 %
Lymphs Abs: 2.6 10*3/uL (ref 0.7–4.0)
Monocytes Absolute: 1.1 10*3/uL — ABNORMAL HIGH (ref 0.1–1.0)
Monocytes Relative: 8 %
Neutro Abs: 8.3 10*3/uL — ABNORMAL HIGH (ref 1.7–7.7)
Neutrophils Relative %: 67 %

## 2020-08-09 LAB — COMPREHENSIVE METABOLIC PANEL
ALT: 36 U/L (ref 0–44)
AST: 40 U/L (ref 15–41)
Albumin: 2.7 g/dL — ABNORMAL LOW (ref 3.5–5.0)
Alkaline Phosphatase: 76 U/L (ref 38–126)
Anion gap: 6 (ref 5–15)
BUN: 19 mg/dL (ref 6–20)
CO2: 28 mmol/L (ref 22–32)
Calcium: 8.6 mg/dL — ABNORMAL LOW (ref 8.9–10.3)
Chloride: 105 mmol/L (ref 98–111)
Creatinine, Ser: <0.3 mg/dL — ABNORMAL LOW (ref 0.44–1.00)
Glucose, Bld: 157 mg/dL — ABNORMAL HIGH (ref 70–99)
Potassium: 4.5 mmol/L (ref 3.5–5.1)
Sodium: 139 mmol/L (ref 135–145)
Total Bilirubin: 0.3 mg/dL (ref 0.3–1.2)
Total Protein: 6.4 g/dL — ABNORMAL LOW (ref 6.5–8.1)

## 2020-08-09 LAB — GLUCOSE, CAPILLARY
Glucose-Capillary: 147 mg/dL — ABNORMAL HIGH (ref 70–99)
Glucose-Capillary: 147 mg/dL — ABNORMAL HIGH (ref 70–99)
Glucose-Capillary: 147 mg/dL — ABNORMAL HIGH (ref 70–99)
Glucose-Capillary: 151 mg/dL — ABNORMAL HIGH (ref 70–99)
Glucose-Capillary: 168 mg/dL — ABNORMAL HIGH (ref 70–99)
Glucose-Capillary: 173 mg/dL — ABNORMAL HIGH (ref 70–99)

## 2020-08-09 LAB — CBC
HCT: 35.9 % — ABNORMAL LOW (ref 36.0–46.0)
Hemoglobin: 11.7 g/dL — ABNORMAL LOW (ref 12.0–15.0)
MCH: 31.5 pg (ref 26.0–34.0)
MCHC: 32.6 g/dL (ref 30.0–36.0)
MCV: 96.8 fL (ref 80.0–100.0)
Platelets: 235 10*3/uL (ref 150–400)
RBC: 3.71 MIL/uL — ABNORMAL LOW (ref 3.87–5.11)
RDW: 14.9 % (ref 11.5–15.5)
WBC: 12.6 10*3/uL — ABNORMAL HIGH (ref 4.0–10.5)
nRBC: 0 % (ref 0.0–0.2)

## 2020-08-09 LAB — PREALBUMIN: Prealbumin: 10.1 mg/dL — ABNORMAL LOW (ref 18–38)

## 2020-08-09 LAB — TRIGLYCERIDES: Triglycerides: 191 mg/dL — ABNORMAL HIGH (ref <150)

## 2020-08-09 LAB — PHOSPHORUS: Phosphorus: 4.3 mg/dL (ref 2.5–4.6)

## 2020-08-09 LAB — MAGNESIUM: Magnesium: 2.3 mg/dL (ref 1.7–2.4)

## 2020-08-09 MED ORDER — IOHEXOL 300 MG/ML  SOLN
100.0000 mL | Freq: Once | INTRAMUSCULAR | Status: AC | PRN
  Administered 2020-08-09: 100 mL via INTRAVENOUS

## 2020-08-09 MED ORDER — TRACE MINERALS CU-MN-SE-ZN 300-55-60-3000 MCG/ML IV SOLN
INTRAVENOUS | Status: AC
  Filled 2020-08-09: qty 528

## 2020-08-09 MED ORDER — IOHEXOL 9 MG/ML PO SOLN
500.0000 mL | ORAL | Status: AC
  Administered 2020-08-09 (×2): 500 mL via ORAL

## 2020-08-09 MED ORDER — MIDODRINE HCL 5 MG PO TABS
5.0000 mg | ORAL_TABLET | Freq: Three times a day (TID) | ORAL | Status: DC
  Administered 2020-08-09 – 2020-08-13 (×11): 5 mg via ORAL
  Filled 2020-08-09 (×10): qty 1

## 2020-08-09 NOTE — Progress Notes (Signed)
Follow up - Critical Care Medicine Note  Patient Details:    Brenda Anderson is an 49 y.o. 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  Patient presented with sepsis noted to have diverticulitis with contained pneumoperitoneum.  Lines, Airways, Drains: PICC Double Lumen 08/02/20 PICC Right Brachial 39 cm 1 cm (Active)  Indication for Insertion or Continuance of Line Prolonged intravenous therapies 08/09/20 1200  Exposed Catheter (cm) 1 cm 08/07/20 0900  Site Assessment Clean;Dry;Intact 08/09/20 1200  Lumen #1 Status Infusing 08/09/20 1200  Lumen #2 Status Infusing 08/09/20 1200  Dressing Type Transparent;Occlusive 08/09/20 1200  Dressing Status Clean;Dry;Intact;Antimicrobial disc in place 08/09/20 1200  Safety Lock Not Applicable 60/63/01 6010  Line Care Connections checked and tightened 08/09/20 0900  Line Adjustment (NICU/IV Team Only) No 08/06/20 2000  Dressing Intervention New dressing 08/02/20 1600  Dressing Change Due 08/09/20 08/07/20 1930     Gastrostomy/Enterostomy Percutaneous endoscopic gastrostomy (PEG) RUQ (Active)  Surrounding Skin Dry;Intact 08/09/20 1217  Tube Status Clamped 08/09/20 1217  Drainage Appearance Bile;Green 08/08/20 2000  Dressing Status Clean;Dry;Intact 08/09/20 1217  Dressing Intervention New dressing 08/08/20 0330  Dressing Type Split gauze 08/09/20 1217  Dressing Change Due 08/08/20 08/07/20 1930  Output (mL) 0 mL 08/09/20 1217     Urethral Catheter Urologist Coude;Non-latex (Active)  Indication for Insertion or Continuance of Catheter Chronic catheter use 08/09/20 0749  Site Assessment Clean;Intact 08/09/20 1217  Catheter Maintenance Bag below level of bladder;Catheter secured;No dependent loops;Drainage bag/tubing not touching floor 08/09/20 1217  Collection Container Standard drainage bag 08/09/20 1217  Securement Method Securing device (Describe) 08/09/20 1217  Urinary Catheter Interventions (if applicable) Unclamped 93/23/55 0751  Output  (mL) 150 mL 08/09/20 1217    Anti-infectives:  Anti-infectives (From admission, onward)   Start     Dose/Rate Route Frequency Ordered Stop   08/02/20 0000  piperacillin-tazobactam (ZOSYN) IVPB 3.375 g        3.375 g 12.5 mL/hr over 240 Minutes Intravenous Every 8 hours 08/01/20 2210     08/01/20 1530  piperacillin-tazobactam (ZOSYN) IVPB 3.375 g        3.375 g 100 mL/hr over 30 Minutes Intravenous  Once 08/01/20 1524 08/01/20 1653     Scheduled Meds: . baclofen  10 mg Per Tube TID  . buPROPion  100 mg Per Tube TID  . chlorhexidine gluconate (MEDLINE KIT)  15 mL Mouth Rinse BID  . Chlorhexidine Gluconate Cloth  6 each Topical Daily  . citalopram  20 mg Per Tube Daily  . enoxaparin (LOVENOX) injection  40 mg Subcutaneous Q24H  . famotidine  20 mg Per Tube BID  . fluticasone  1 spray Each Nare Daily  . mouth rinse  15 mL Mouth Rinse 10 times per day  . melatonin  7.5 mg Per Tube QHS  . midodrine  5 mg Oral TID WC  . montelukast  10 mg Per Tube QHS  . pregabalin  100 mg Per Tube BID  . sodium chloride flush  10-40 mL Intracatheter Q12H   Continuous Infusions: . piperacillin-tazobactam (ZOSYN)  IV 12.5 mL/hr at 08/09/20 1715  . TPN ADULT (ION) 55 mL/hr at 08/09/20 1715   PRN Meds:.HYDROcodone-acetaminophen, morphine injection, naphazoline-glycerin, ondansetron (ZOFRAN) IV, polyethylene glycol, pregabalin, promethazine, sodium chloride flush, sodium phosphate   Microbiology: Results for orders placed or performed during the hospital encounter of 08/01/20  SARS Coronavirus 2 by RT PCR (hospital order, performed in [facility] hospital lab) Nasopharyngeal Nasopharyngeal Swab     Status: None  Collection Time: 08/01/20  3:47 PM   Specimen: Nasopharyngeal Swab  Result Value Ref Range Status   SARS Coronavirus 2 NEGATIVE NEGATIVE Final    Comment: (NOTE) SARS-CoV-2 target nucleic acids are NOT DETECTED.  The SARS-CoV-2 RNA is generally detectable in upper and lower respiratory  specimens during the acute phase of infection. The lowest concentration of SARS-CoV-2 viral copies this assay can detect is 250 copies / mL. A negative result does not preclude SARS-CoV-2 infection and should not be used as the sole basis for treatment or other patient management decisions.  A negative result may occur with improper specimen collection / handling, submission of specimen other than nasopharyngeal swab, presence of viral mutation(s) within the areas targeted by this assay, and inadequate number of viral copies (<250 copies / mL). A negative result must be combined with clinical observations, patient history, and epidemiological information.  Fact Sheet for Patients:   StrictlyIdeas.no  Fact Sheet for Healthcare Providers: BankingDealers.co.za  This test is not yet approved or  cleared by the [place] FDA and has been authorized for detection and/or diagnosis of SARS-CoV-2 by FDA under an Emergency Use Authorization (EUA).  This EUA will remain in effect (meaning this test can be used) for the duration of the COVID-19 declaration under Section 564(b)(1) of the Act, 21 U.S.C. section 360bbb-3(b)(1), unless the authorization is terminated or revoked sooner.  Performed at [facility], [address] [place], [place] [address]   CULTURE, BLOOD (ROUTINE X 2) w Reflex to ID Panel     Status: None   Collection Time: 08/01/20  5:42 PM   Specimen: BLOOD  Result Value Ref Range Status   Specimen Description BLOOD BLOOD LEFT HAND  Final   Special Requests   Final    BOTTLES DRAWN AEROBIC AND ANAEROBIC Blood Culture adequate volume   Culture   Final    NO GROWTH 5 DAYS Performed at [facility], [address] [place], [place] [address]    Report Status 08/06/2020 FINAL  Final  MRSA PCR Screening     Status: None   Collection Time: 08/01/20  5:55 PM   Specimen: Nasopharyngeal  Result Value Ref Range  Status   MRSA by PCR NEGATIVE NEGATIVE Final    Comment:        The GeneXpert MRSA Assay (FDA approved for NASAL specimens only), is one component of a comprehensive MRSA colonization surveillance program. It is not intended to diagnose MRSA infection nor to guide or monitor treatment for MRSA infections. Performed at [facility], [address] [place], [place] [address]   CULTURE, BLOOD (ROUTINE X 2) w Reflex to ID Panel     Status: None   Collection Time: 08/01/20  6:04 PM   Specimen: BLOOD  Result Value Ref Range Status   Specimen Description BLOOD BLOOD RIGHT HAND  Final   Special Requests   Final    BOTTLES DRAWN AEROBIC AND ANAEROBIC Blood Culture adequate volume   Culture   Final    NO GROWTH 5 DAYS Performed at [facility], [address] [place], [place] [address]    Report Status 08/06/2020 FINAL  Final  Culture, respiratory (non-expectorated)     Status: None   Collection Time: 08/06/20 12:29 PM   Specimen: Tracheal Aspirate; Respiratory  Result Value Ref Range Status   Specimen Description   Final    TRACHEAL ASPIRATE Performed at [facility], [address], [place],  [address]  Special Requests   Final    NONE Performed at [facility], [place], [place] [address]    Gram Stain   Final    FEW WBC PRESENT, PREDOMINANTLY PMN MODERATE GRAM VARIABLE ROD Performed at [facility], [address] [place], [place] [address]    Culture ABUNDANT PSEUDOMONAS AERUGINOSA  Final   Report Status 08/09/2020 FINAL  Final   Organism ID, Bacteria PSEUDOMONAS AERUGINOSA  Final      Susceptibility   Pseudomonas aeruginosa - MIC*    CEFTAZIDIME 4 SENSITIVE Sensitive     CIPROFLOXACIN >=4 RESISTANT Resistant     GENTAMICIN <=1 SENSITIVE Sensitive     IMIPENEM >=16 RESISTANT Resistant     * ABUNDANT PSEUDOMONAS AERUGINOSA   Results for orders placed or performed during the hospital encounter of  08/01/20 (from the past 24 hour(s))  Glucose, capillary     Status: Abnormal   Collection Time: 08/09/20  3:36 AM  Result Value Ref Range   Glucose-Capillary 168 (H) 70 - 99 mg/dL  Magnesium     Status: None   Collection Time: 08/09/20  4:46 AM  Result Value Ref Range   Magnesium 2.3 1.7 - 2.4 mg/dL  Phosphorus     Status: None   Collection Time: 08/09/20  4:46 AM  Result Value Ref Range   Phosphorus 4.3 2.5 - 4.6 mg/dL  Comprehensive metabolic panel     Status: Abnormal   Collection Time: 08/09/20  4:46 AM  Result Value Ref Range   Sodium 139 135 - 145 mmol/L   Potassium 4.5 3.5 - 5.1 mmol/L   Chloride 105 98 - 111 mmol/L   CO2 28 22 - 32 mmol/L   Glucose, Bld 157 (H) 70 - 99 mg/dL   BUN 19 6 - 20 mg/dL   Creatinine, Ser <0.30 (L) 0.44 - 1.00 mg/dL   Calcium 8.6 (L) 8.9 - 10.3 mg/dL   Total Protein 6.4 (L) 6.5 - 8.1 g/dL   Albumin 2.7 (L) 3.5 - 5.0 g/dL   AST 40 15 - 41 U/L   ALT 36 0 - 44 U/L   Alkaline Phosphatase 76 38 - 126 U/L   Total Bilirubin 0.3 0.3 - 1.2 mg/dL   GFR calc non Af Amer NOT CALCULATED >60 mL/min   GFR calc Af Amer NOT CALCULATED >60 mL/min   Anion gap 6 5 - 15  CBC     Status: Abnormal   Collection Time: 08/09/20  4:46 AM  Result Value Ref Range   WBC 12.6 (H) 4.0 - 10.5 K/uL   RBC 3.71 (L) 3.87 - 5.11 MIL/uL   Hemoglobin 11.7 (L) 12.0 - 15.0 g/dL   HCT 35.9 (L) 36 - 46 %   MCV 96.8 80.0 - 100.0 fL   MCH 31.5 26.0 - 34.0 pg   MCHC 32.6 30.0 - 36.0 g/dL   RDW 14.9 11.5 - 15.5 %   Platelets 235 150 - 400 K/uL   nRBC 0.0 0.0 - 0.2 %  Differential     Status: Abnormal   Collection Time: 08/09/20  4:46 AM  Result Value Ref Range   Neutrophils Relative % 67 %   Neutro Abs 8.3 (H) 1.7 - 7.7 K/uL   Lymphocytes Relative 21 %   Lymphs Abs 2.6 0.7 - 4.0 K/uL   Monocytes Relative 8 %   Monocytes Absolute 1.1 (H) 0 - 1 K/uL   Eosinophils Relative 3 %   Eosinophils Absolute 0.4 0 -  0 K/uL   Basophils Relative 0 %   Basophils Absolute 0.1 0 - 0 K/uL    Immature Granulocytes 1 %   Abs Immature Granulocytes 0.14 (H) 0.00 - 0.07 K/uL  Triglycerides     Status: Abnormal   Collection Time: 08/09/20  4:46 AM  Result Value Ref Range   Triglycerides 191 (H) <150 mg/dL  Glucose, capillary     Status: Abnormal   Collection Time: 08/09/20  7:22 AM  Result Value Ref Range   Glucose-Capillary 151 (H) 70 - 99 mg/dL  Prealbumin     Status: Abnormal   Collection Time: 08/09/20  8:16 AM  Result Value Ref Range   Prealbumin 10.1 (L) 18 - 38 mg/dL  Glucose, capillary     Status: Abnormal   Collection Time: 08/09/20 11:48 AM  Result Value Ref Range   Glucose-Capillary 147 (H) 70 - 99 mg/dL  Glucose, capillary     Status: Abnormal   Collection Time: 08/09/20  4:09 PM  Result Value Ref Range   Glucose-Capillary 173 (H) 70 - 99 mg/dL  Glucose, capillary     Status: Abnormal   Collection Time: 08/09/20  7:35 PM  Result Value Ref Range   Glucose-Capillary 147 (H) 70 - 99 mg/dL  Glucose, capillary     Status: Abnormal   Collection Time: 08/09/20 11:36 PM  Result Value Ref Range   Glucose-Capillary 147 (H) 70 - 99 mg/dL    Best Practice/Protocols:  VTE Prophylaxis: Lovenox (prophylaxtic dose) GI Prophylaxis: Antihistamine ICU hypo/hyperglycemia  Events:   Studies: CT ABDOMEN PELVIS W CONTRAST  Result Date: 08/09/2020 CLINICAL DATA:  Suspected diverticulitis, follow-up pneumoperitoneum EXAM: CT ABDOMEN AND PELVIS WITH CONTRAST TECHNIQUE: Multidetector CT imaging of the abdomen and pelvis was performed using the standard protocol following bolus administration of intravenous contrast. CONTRAST:  135m OMNIPAQUE IOHEXOL 300 MG/ML  SOLN COMPARISON:  08/05/2020 FINDINGS: Lower chest: Heterogeneous airspace opacity of the right lung base with a small associated right pleural effusion. Bilateral breast implants Hepatobiliary: No solid liver abnormality is seen. No gallstones, gallbladder wall thickening, or biliary dilatation. Pancreas: Unremarkable.  No pancreatic ductal dilatation or surrounding inflammatory changes. Spleen: Normal in size without significant abnormality. Adrenals/Urinary Tract: Adrenal glands are unremarkable. Redemonstrated nonobstructive right nephrolithiasis. No hydronephrosis. Foley catheter in the urinary bladder. Stomach/Bowel: Redemonstrated percutaneous gastrostomy tube. Appendix appears normal. No evidence of bowel wall thickening, distention, or inflammatory changes. Severe pancolonic diverticulosis. Vascular/Lymphatic: No significant vascular findings are present. No enlarged abdominal or pelvic lymph nodes. Reproductive: No mass or other significant abnormality. Other: No abdominal wall hernia or abnormality. Small volume pneumoperitoneum seen on prior examination is persistent although reduced in volume (series 2, image 46). Musculoskeletal: No acute or significant osseous findings. Profound sarcopenia throughout. IMPRESSION: 1. Small volume pneumoperitoneum seen on prior examination is persistent although reduced in volume. As on prior examination, there is no definite etiology identified. Of note, mesenteric air loculations seen on prior examination are resolved. 2. Previously seen rectal wall thickening is resolved. 3. Heterogeneous airspace opacity of the right lung base with a small associated right pleural effusion, most consistent with infection or aspiration. 4. Severe pancolonic diverticulosis without evidence of acute diverticulitis. 5. Redemonstrated percutaneous gastrostomy tube. 6. Nonobstructive right nephrolithiasis. Electronically Signed   By: AEddie CandleM.D.   On: 08/09/2020 13:07   CT ABDOMEN PELVIS W CONTRAST  Result Date: 08/05/2020 CLINICAL DATA:  Abdominal abscess or infection suspected, history of diverticulitis, persistent leukocytosis EXAM: CT ABDOMEN AND PELVIS WITH CONTRAST  TECHNIQUE: Multidetector CT imaging of the abdomen and pelvis was performed using the standard protocol following bolus  administration of intravenous contrast. CONTRAST:  176m OMNIPAQUE IOHEXOL 300 MG/ML SOLN, additional oral enteric contrast COMPARISON:  08/01/2020 FINDINGS: Lower chest: Small right pleural effusion and associated atelectasis or consolidation. Bilateral breast implants. Hepatobiliary: No solid liver abnormality is seen. No gallstones, gallbladder wall thickening, or biliary dilatation. Pancreas: Unremarkable. No pancreatic ductal dilatation or surrounding inflammatory changes. Spleen: Normal in size without significant abnormality. Adrenals/Urinary Tract: Adrenal glands are unremarkable. Nonobstructive right nephrolithiasis. No hydronephrosis. Foley catheter in the urinary bladder, containing air within the bladder lumen. Bladder is unremarkable. Stomach/Bowel: Percutaneous gastrostomy. Appendix is not clearly visualized and may be surgically absent. Severe pancolonic diverticulosis. There is wall thickening of the distal sigmoid colon and rectum with interval disimpaction of large stool balls previously seen in the rectum. Vascular/Lymphatic: Aortic atherosclerosis. No enlarged abdominal or pelvic lymph nodes. Reproductive: No mass or other significant abnormality. Other: No abdominal wall hernia or abnormality. There is small volume pneumoperitoneum in the ventral abdomen, with small loculations of air in the small bowel mesentery and sigmoid mesocolon (series 5, image 25, series 2, image 70). Musculoskeletal: No acute or significant osseous findings. Severe sarcopenia throughout the included musculature. IMPRESSION: 1. There is small volume pneumoperitoneum in the ventral abdomen, with small loculations of air in the small bowel mesentery and sigmoid mesocolon. Findings are consistent with bowel perforation although of uncertain etiology, possibly related to mechanical disimpaction or perforated stercoral ulceration given previously noted large stool balls in the rectum. 2. There is wall thickening of the distal  sigmoid colon and rectum suggestive of nonspecific infectious or inflammatory proctocolitis. 3. Severe pancolonic diverticulosis without evidence of acute diverticulitis. 4. Small right pleural effusion and associated atelectasis or consolidation. 5. Nonobstructive right nephrolithiasis. 6. Severe sarcopenia. 7. Aortic Atherosclerosis (ICD10-I70.0). These results will be called to the ordering clinician or representative by the Radiologist Assistant, and communication documented in the PACS or [employer] Electronically Signed   By: AEddie CandleM.D.   On: 08/05/2020 11:35   CT ABDOMEN PELVIS W CONTRAST  Result Date: 08/01/2020 CLINICAL DATA:  Abdominal distension, lower abdominal pain EXAM: CT ABDOMEN AND PELVIS WITH CONTRAST TECHNIQUE: Multidetector CT imaging of the abdomen and pelvis was performed using the standard protocol following bolus administration of intravenous contrast. CONTRAST:  1032mOMNIPAQUE IOHEXOL 300 MG/ML  SOLN COMPARISON:  May 01, 2013 FINDINGS: Lower chest: Basilar atelectasis. No consolidation. No pleural effusion. Hepatobiliary: Liver without focal, suspicious lesion. Portal vein is patent. No pericholecystic stranding. No biliary duct dilation. Pancreas: Pancreas is normal without ductal dilation or inflammation. Spleen: Spleen normal in size and contour. Adrenals/Urinary Tract: Adrenal glands are normal. No signs of hydronephrosis. Nephrolithiasis. Large calculus in the RIGHT upper pole measuring approximately 1.6 x 1.0 cm. Smaller calculi in the RIGHT renal pelvis and a smaller interpolar calculus in an interpolar calyx measuring approximately 7 mm. Normal appearance of the LEFT kidney Stomach/Bowel: Signs of acute diverticulitis along the descending sigmoid junction. Rectal distension up to 8 cm.  Pancolonic diverticulosis. Gastric tube in situ similar to the prior study. No acute small bowel process. Vascular/Lymphatic: Scattered atheromatous plaque of the abdominal aorta  tracking in the iliac vessels. No adenopathy in the retroperitoneum. No adenopathy in the pelvis. Reproductive: No adnexal mass. Other: No free air.  No abscess. Musculoskeletal: Thinning of the sacrum and marked osteopenia with loss of muscular density, replacement with fatty atrophy. Bilateral breast implants. No acute  musculoskeletal process. IMPRESSION: 1. Signs of acute diverticulitis along the descending sigmoid junction in the setting of pancolonic diverticulosis. No abscess or free air. 2. Fecal impaction 3. RIGHT-sided nephrolithiasis with large calculi as described. 4. Muscular atrophy and osteopenia likely related to primary diagnosis of ALS. Thinning of the sacrococcygeal region with respect of bone may relate to prior pressure ulceration. There is no stranding in the fat to suggest decubitus changes at this time in this location. Mild stranding is noted overlying the RIGHT ischium. 5. Aortic atherosclerosis. Aortic Atherosclerosis (ICD10-I70.0). Electronically Signed   By: Zetta Bills M.D.   On: 08/01/2020 15:21   DG Chest Port 1 View  Result Date: 08/04/2020 CLINICAL DATA:  Leukocytosis. EXAM: PORTABLE CHEST 1 VIEW COMPARISON:  Chest radiograph 08/31/2016. Lung bases from abdominal CT 08/01/2020 FINDINGS: Tracheostomy tube tip at the thoracic inlet. Right upper extremity PICC tip in the lower SVC. Chronic elevation of right hemidiaphragm. Adjacent compressive atelectasis/scarring at the right lung base. There is mild biapical pleuroparenchymal scarring. No acute or confluent airspace disease. No pleural effusion or pneumothorax. No acute osseous abnormalities are seen. IMPRESSION: 1. Chronic elevation of right hemidiaphragm with chronic atelectasis or scarring at the right lung base. 2. Tracheostomy tube tip at the thoracic inlet. 3. Right upper extremity PICC tip in the lower SVC. Electronically Signed   By: Keith Rake M.D.   On: 08/04/2020 17:28   DG Abd Portable 2V  Result Date:  08/06/2020 CLINICAL DATA:  Diverticulitis.  Follow-up. EXAM: PORTABLE ABDOMEN - 2 VIEW COMPARISON:  CT abdomen done yesterday. FINDINGS: Previously administered contrast now present throughout the colon. Gastrostomy tube in place. No sign of bowel obstruction. No visible free air. Curvature and chronic degenerative changes of the spine. IMPRESSION: Previously administered contrast now present throughout the colon. No sign of bowel obstruction or free air. Electronically Signed   By: Nelson Chimes M.D.   On: 08/06/2020 11:48   [place] EKG SITE RITE  Result Date: 08/02/2020 If [facility] image not attached, placement could not be confirmed due to current cardiac rhythm.   Consults: Treatment Team:  Jules Husbands, MD Abbie Sons, MD   Subjective:    Overnight Issues: Overnight no issues.  Remains on home vent without distress.  Does not endorse pain by mouthing "no"  Objective:  Vital signs for last 24 hours: Temp:  [97.2 F (36.2 C)-98.9 F (37.2 C)] 98.9 F (37.2 C) (08/30 1300) Pulse Rate:  [87-142] 132 (08/30 1300) Resp:  [12-31] 26 (08/30 1300) BP: (77-114)/(50-72) 114/60 (08/30 1300) SpO2:  [93 %-98 %] 97 % (08/30 1300) FiO2 (%):  [28 %] 28 % (08/30 1110) Weight:  [70.9 kg] 70.9 kg (08/30 0445)  Hemodynamic parameters for last 24 hours:    Intake/Output from previous day: 08/29 0701 - 08/30 0700 In: 1385.9 [I.V.:651.5; IV Piggyback:734.4] Out: 2250 [Urine:1800; Drains:450]  Intake/Output this shift: Total I/O In: -  Out: 950 [Urine:850; Drains:100]  Vent settings for last 24 hours: Vent Mode: AC FiO2 (%):  [28 %] 28 % Set Rate:  [12 bmp] 12 bmp Vt Set:  [450 mL] 450 mL PEEP:  [5 cmH20] 5 cmH20  Physical Exam:  GENERAL: Chronically ill appearing female, laying in bed, on chronic vent via Trach, in NAD. Temporal wasting. 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 Derrill Kay, MD [facility] 08/09/2020  *Care during the described time interval was provided by me and/or other providers on the critical care team.  I have reviewed this patient's available data, including medical history, events of note, physical examination and test results as part of my evaluation.  **This note was dictated using voice recognition software/Dragon.  Despite best efforts to proofread, errors can occur which can change the meaning.  Any change was purely unintentional.

## 2020-08-09 NOTE — Progress Notes (Signed)
PHARMACY - TOTAL PARENTERAL NUTRITION CONSULT NOTE   Indication: prolonged inability for tube feeds  Patient Measurements: Height: 5' 5.98" (167.6 cm) Weight: 70.9 kg (156 lb 4.9 oz) IBW/kg (Calculated) : 59.26 TPN AdjBW (KG): 71.1 Body mass index is 25.24 kg/m.  Assessment: 50 year old female admitted with diverticulitis. Plan was to possibly restart tube feeds after holding for several days. Repeat CT A/P with increase in pneumoperitoneum although patient clinically improving. Plan to continue conservative management for now. Patient to start on TPN as tube feeds will continue to be held.  Glucose / Insulin: no SSI BG 158 - 190 Electrolytes: WNL today Renal: Scr<1, stable LFTs / TGs: TG 194-->191 Prealbumin / albumin: albumin 4 MIVF: none GI Imaging:  8/27 No sign of bowel obstruction or free air  Central access: PICC TPN start date: 8/27  Nutritional Goals (per RD recommendation on 8/27): kCal: 1420, Protein: 70-85 g/day, Fluid: 1.2-1.4 L/day Goal TPN rate is ~55 mL/hr (provides 81 g of protein and 1420 kcals per day)  Current Nutrition: CLD through PEG  Plan:   continue TPN at goal rate of 55 mL/hr  Electrolytes in TPN: 50 mEq/L of Na, 50 mEq/L of K, 5 mEq/L of Ca, 5 mEq/L of Mg, and 15 mmol/L of Phos. Cl:Ac ratio 1:1  Add standard MVI and trace elements to TPN  Issues with hypoglycemia - no SSI insulin at this time  Monitor TPN labs on Mon/Thurs, daily for now s/t electrolyte abnormalities  Lowella Bandy, PharmD, BCPS 08/09/2020,8:32 AM

## 2020-08-09 NOTE — Progress Notes (Signed)
[facility] SURGICAL PROGRESS NOTE (cpt [phone])  Hospital Day(s): 8.   Interval History:  Patient seen and examined no acute events or new complaints overnight.  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  No electrolyte derangements Currently on TPN (55 ml/hr) + CLD through PEG  Review of Systems:  Unable to reliably preform secondary to non-verbal status  Vital signs in last 24 hours: [min-max] current  Temp:  [97.2 F (36.2 C)-98.9 F (37.2 C)] 98.7 F (37.1 C) (08/30 0400) Pulse Rate:  [94-127] 122 (08/30 0730) Resp:  [12-25] 20 (08/30 0730) BP: (83-124)/(60-77) 105/72 (08/30 0730) SpO2:  [92 %-97 %] 95 % (08/30 0745) FiO2 (%):  [28 %] 28 % (08/30 0745) Weight:  [70.9 kg] 70.9 kg (08/30 0445)     Height: 5' 5.98" (167.6 cm) Weight: 70.9 kg BMI (Calculated): 25.24   Intake/Output last 2 shifts:  08/29 0701 - 08/30 0700 In: 1385.9 [I.V.:651.5; IV Piggyback:734.4] Out: 2250 [Urine:1800; Drains:450]   Physical Exam:  Constitutional: Somnolent, arouses, shakes head to answer very simple questions HENT: normocephalic without obvious abnormality, tracheostomy present Respiratory:On ventilator Cardiovascular:regular rateand sinus rhythm  Gastrointestinal: soft,I am unable to illicit tenderness nor any evidence of peritonitis although this is very limited given her lack of participation in examination and 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 Genitourinary: Foley in palce   Labs:  CBC Latest Ref Rng & Units 08/09/2020 08/07/2020 08/06/2020  WBC 4.0 - 10.5 K/uL 12.6(H) 8.2 9.5  Hemoglobin 12.0 - 15.0 g/dL 11.7(L) 11.9(L) 11.9(L)  Hematocrit 36 - 46 % 35.9(L) 35.9(L) 36.4  Platelets 150 - 400 K/uL 235 251 254   CMP Latest Ref Rng & Units 08/09/2020 08/08/2020  08/07/2020  Glucose 70 - 99 mg/dL 884(Z) 660(Y) 301(S)  BUN 6 - 20 mg/dL 19 14 7   Creatinine 0.44 - 1.00 mg/dL ) <0.10(X) <3.23(F)  Sodium 135 - 145 mmol/L 139 141 141  Potassium 3.5 - 5.1 mmol/L 4.5 3.3(L) 3.8  Chloride 98 - 111 mmol/L 105 102 108  CO2 22 - 32 mmol/L 28 30 28   Calcium 8.9 - 10.3 mg/dL <5.73(U) ) 2.0(U)  Total Protein 6.5 - 8.1 g/dL 6.4(L) - 5.8(L)  Total Bilirubin 0.3 - 1.2 mg/dL 0.3 - 0.6  Alkaline Phos 38 - 126 U/L 76 - 63  AST 15 - 41 U/L 40 - 13(L)  ALT 0 - 44 U/L 36 - 11     Imaging studies: No new pertinent imaging studies   Assessment/Plan: (ICD-10's: K30.92) 49 y.o. female with slight new increase in leukocytosis admitted with acute uncomplicated diverticulitis found to have increase in pneumoperitoneum on CT A/P (08/26) although she is otherwise clinically improving and without overt evidence of peritonitis or sepsis   - Plan for repeat CT Abdomen/pelvis this morning to reassess intra-abdominal process given lack of subjective input ad previous pneumoperitoneum  - I am okay with CLD through PEG for now; continue TPN goal rate  - Continue IV Abx (Zosyn) - Monitor leukocytosis, electrolytes  - Monitor abdominal examination - Pain control prn; antiemetics prn - No emergent surgical intervention - Appreciatepalliative consultation; full scope - Further management per primary service; we will follow   All of the above findings and recommendations were discussed with the medical team.  -- 42, PA-C [facility]  08/09/2020, 7:52 AM [phone] M-F: 7am - 4pm

## 2020-08-09 NOTE — TOC Initial Note (Signed)
Transition of Care [facility]) - Progression Note    Patient Details  Name: BATHSHEBA DURRETT MRN: [id number] Date of Birth: 10/21/1971  Transition [facility]) CM/SW Contact  Marina Goodell Phone Number: [phone] 08/09/2020, 2:33 PM  Clinical Narrative:     Patient presents to [facility] due to lower abdominal pain.  Patient lives at home with Paulene Floor (Mother) [phone].  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  Patietn is active with [facility] and has 14 hour a day RN care. Attending recommended LTACH for continued care for patient.  This CSW spoke with patient's mother, Ms. Katrinka Blazing stated she would prefer if the patient came directly home from hospital.  Ms. Katrinka Blazing stated she would like to speak with Attending to receive more information about why LTAC is recommended and if the patient returning home with home health, directly from the hospital is an option. TOC will continue to follow.      Expected Discharge Plan and Services                                                 Social Determinants of Health (SDOH) Interventions    Readmission Risk Interventions No flowsheet data found.

## 2020-08-09 NOTE — Progress Notes (Signed)
Transported pt to CT and back from CT on her home ventilator. No issues with transport.

## 2020-08-10 ENCOUNTER — Inpatient Hospital Stay: Payer: Medicare HMO

## 2020-08-10 DIAGNOSIS — A419 Sepsis, unspecified organism: Secondary | ICD-10-CM

## 2020-08-10 LAB — CBC WITH DIFFERENTIAL/PLATELET
Abs Immature Granulocytes: 0.18 10*3/uL — ABNORMAL HIGH (ref 0.00–0.07)
Basophils Absolute: 0.1 10*3/uL (ref 0.0–0.1)
Basophils Relative: 1 %
Eosinophils Absolute: 0.7 10*3/uL — ABNORMAL HIGH (ref 0.0–0.5)
Eosinophils Relative: 5 %
HCT: 34 % — ABNORMAL LOW (ref 36.0–46.0)
Hemoglobin: 11.2 g/dL — ABNORMAL LOW (ref 12.0–15.0)
Immature Granulocytes: 1 %
Lymphocytes Relative: 19 %
Lymphs Abs: 3 10*3/uL (ref 0.7–4.0)
MCH: 31.4 pg (ref 26.0–34.0)
MCHC: 32.9 g/dL (ref 30.0–36.0)
MCV: 95.2 fL (ref 80.0–100.0)
Monocytes Absolute: 1.7 10*3/uL — ABNORMAL HIGH (ref 0.1–1.0)
Monocytes Relative: 11 %
Neutro Abs: 9.7 10*3/uL — ABNORMAL HIGH (ref 1.7–7.7)
Neutrophils Relative %: 63 %
Platelets: 285 10*3/uL (ref 150–400)
RBC: 3.57 MIL/uL — ABNORMAL LOW (ref 3.87–5.11)
RDW: 14.8 % (ref 11.5–15.5)
WBC: 15.3 10*3/uL — ABNORMAL HIGH (ref 4.0–10.5)
nRBC: 0 % (ref 0.0–0.2)

## 2020-08-10 LAB — CULTURE, RESPIRATORY W GRAM STAIN

## 2020-08-10 LAB — BASIC METABOLIC PANEL
Anion gap: 5 (ref 5–15)
BUN: 23 mg/dL — ABNORMAL HIGH (ref 6–20)
CO2: 29 mmol/L (ref 22–32)
Calcium: 8.7 mg/dL — ABNORMAL LOW (ref 8.9–10.3)
Chloride: 104 mmol/L (ref 98–111)
Creatinine, Ser: <0.3 mg/dL — ABNORMAL LOW (ref 0.44–1.00)
Glucose, Bld: 150 mg/dL — ABNORMAL HIGH (ref 70–99)
Potassium: 4.4 mmol/L (ref 3.5–5.1)
Sodium: 138 mmol/L (ref 135–145)

## 2020-08-10 LAB — GLUCOSE, CAPILLARY
Glucose-Capillary: 116 mg/dL — ABNORMAL HIGH (ref 70–99)
Glucose-Capillary: 127 mg/dL — ABNORMAL HIGH (ref 70–99)
Glucose-Capillary: 130 mg/dL — ABNORMAL HIGH (ref 70–99)
Glucose-Capillary: 141 mg/dL — ABNORMAL HIGH (ref 70–99)
Glucose-Capillary: 144 mg/dL — ABNORMAL HIGH (ref 70–99)
Glucose-Capillary: 149 mg/dL — ABNORMAL HIGH (ref 70–99)
Glucose-Capillary: 151 mg/dL — ABNORMAL HIGH (ref 70–99)

## 2020-08-10 LAB — PHOSPHORUS: Phosphorus: 2.8 mg/dL (ref 2.5–4.6)

## 2020-08-10 LAB — MAGNESIUM: Magnesium: 2.4 mg/dL (ref 1.7–2.4)

## 2020-08-10 MED ORDER — VITAL HIGH PROTEIN PO LIQD: 1000.0000 mL | ORAL | Status: DC

## 2020-08-10 MED ORDER — METRONIDAZOLE IN NACL 5-0.79 MG/ML-% IV SOLN
500.0000 mg | Freq: Three times a day (TID) | INTRAVENOUS | Status: DC
  Administered 2020-08-10 – 2020-08-12 (×7): 500 mg via INTRAVENOUS
  Filled 2020-08-10 (×9): qty 100

## 2020-08-10 MED ORDER — SODIUM CHLORIDE 0.9 % IV SOLN
200.0000 mg | Freq: Once | INTRAVENOUS | Status: AC
  Administered 2020-08-10: 200 mg via INTRAVENOUS
  Filled 2020-08-10: qty 200

## 2020-08-10 MED ORDER — SODIUM CHLORIDE 0.9 % IV SOLN
100.0000 mg | INTRAVENOUS | Status: DC
  Administered 2020-08-11 – 2020-08-12 (×2): 100 mg via INTRAVENOUS
  Filled 2020-08-10 (×3): qty 100

## 2020-08-10 MED ORDER — TRACE MINERALS CU-MN-SE-ZN 300-55-60-3000 MCG/ML IV SOLN
INTRAVENOUS | Status: AC
  Filled 2020-08-10: qty 288

## 2020-08-10 MED ORDER — OSMOLITE 1.5 CAL PO LIQD
1000.0000 mL | ORAL | Status: DC
  Administered 2020-08-10: 1000 mL

## 2020-08-10 MED ORDER — SODIUM CHLORIDE 0.9 % IV SOLN
2.0000 g | Freq: Three times a day (TID) | INTRAVENOUS | Status: DC
  Administered 2020-08-10 – 2020-08-12 (×6): 2 g via INTRAVENOUS
  Filled 2020-08-10 (×9): qty 2

## 2020-08-10 NOTE — Progress Notes (Signed)
Follow up - Critical Care Medicine Note  Patient Details:    Brenda Anderson is an 49 y.o. 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  Patient presented with sepsis noted to have diverticulitis with contained pneumoperitoneum.  Lines, Airways, Drains: PICC Double Lumen 08/02/20 PICC Right Brachial 39 cm 1 cm (Active)  Indication for Insertion or Continuance of Line Prolonged intravenous therapies 08/09/20 1200  Exposed Catheter (cm) 1 cm 08/07/20 0900  Site Assessment Clean;Dry;Intact 08/09/20 1200  Lumen #1 Status Infusing 08/09/20 1200  Lumen #2 Status Infusing 08/09/20 1200  Dressing Type Transparent;Occlusive 08/09/20 1200  Dressing Status Clean;Dry;Intact;Antimicrobial disc in place 08/09/20 1200  Safety Lock Not Applicable 31/54/00 8676  Line Care Connections checked and tightened 08/09/20 0900  Line Adjustment (NICU/IV Team Only) No 08/06/20 2000  Dressing Intervention New dressing 08/02/20 1600  Dressing Change Due 08/09/20 08/07/20 1930     Gastrostomy/Enterostomy Percutaneous endoscopic gastrostomy (PEG) RUQ (Active)  Surrounding Skin Dry;Intact 08/09/20 1217  Tube Status Clamped 08/09/20 1217  Drainage Appearance Bile;Green 08/08/20 2000  Dressing Status Clean;Dry;Intact 08/09/20 1217  Dressing Intervention New dressing 08/08/20 0330  Dressing Type Split gauze 08/09/20 1217  Dressing Change Due 08/08/20 08/07/20 1930  Output (mL) 0 mL 08/09/20 1217     Urethral Catheter Urologist Coude;Non-latex (Active)  Indication for Insertion or Continuance of Catheter Chronic catheter use 08/09/20 0749  Site Assessment Clean;Intact 08/09/20 1217  Catheter Maintenance Bag below level of bladder;Catheter secured;No dependent loops;Drainage bag/tubing not touching floor 08/09/20 1217  Collection Container Standard drainage bag 08/09/20 1217  Securement Method Securing device (Describe) 08/09/20 1217  Urinary Catheter Interventions (if applicable) Unclamped 19/50/93 0751  Output  (mL) 150 mL 08/09/20 1217    Anti-infectives:  Anti-infectives (From admission, onward)   Start     Dose/Rate Route Frequency Ordered Stop   08/11/20 1200  anidulafungin (ERAXIS) 100 mg in sodium chloride 0.9 % 100 mL IVPB        100 mg 78 mL/hr over 100 Minutes Intravenous Every 24 hours 08/10/20 0958     08/10/20 2200  metroNIDAZOLE (FLAGYL) IVPB 500 mg        500 mg 100 mL/hr over 60 Minutes Intravenous Every 8 hours 08/10/20 1325     08/10/20 1400  ceFEPIme (MAXIPIME) 2 g in sodium chloride 0.9 % 100 mL IVPB        2 g 200 mL/hr over 30 Minutes Intravenous Every 8 hours 08/10/20 1135     08/10/20 1200  anidulafungin (ERAXIS) 200 mg in sodium chloride 0.9 % 200 mL IVPB        200 mg 78 mL/hr over 200 Minutes Intravenous  Once 08/10/20 0958     08/02/20 0000  piperacillin-tazobactam (ZOSYN) IVPB 3.375 g  Status:  Discontinued        3.375 g 12.5 mL/hr over 240 Minutes Intravenous Every 8 hours 08/01/20 2210 08/10/20 1135   08/01/20 1530  piperacillin-tazobactam (ZOSYN) IVPB 3.375 g        3.375 g 100 mL/hr over 30 Minutes Intravenous  Once 08/01/20 1524 08/01/20 1653     Scheduled Meds: . baclofen  10 mg Per Tube TID  . buPROPion  100 mg Per Tube TID  . chlorhexidine gluconate (MEDLINE KIT)  15 mL Mouth Rinse BID  . Chlorhexidine Gluconate Cloth  6 each Topical Daily  . citalopram  20 mg Per Tube Daily  . enoxaparin (LOVENOX) injection  40 mg Subcutaneous Q24H  . famotidine  20 mg Per Tube  BID  . feeding supplement (OSMOLITE 1.5 CAL)  1,000 mL Per Tube Q24H  . fluticasone  1 spray Each Nare Daily  . mouth rinse  15 mL Mouth Rinse 10 times per day  . melatonin  7.5 mg Per Tube QHS  . midodrine  5 mg Oral TID WC  . montelukast  10 mg Per Tube QHS  . pregabalin  100 mg Per Tube BID  . sodium chloride flush  10-40 mL Intracatheter Q12H   Continuous Infusions: . [START ON 08/11/2020] anidulafungin    . anidulafungin 78 mL/hr at 08/10/20 1158  . ceFEPime (MAXIPIME) IV    .  metronidazole    . TPN ADULT (ION) 55 mL/hr at 08/10/20 1158  . TPN ADULT (ION)     PRN Meds:.HYDROcodone-acetaminophen, morphine injection, naphazoline-glycerin, ondansetron (ZOFRAN) IV, polyethylene glycol, pregabalin, promethazine, sodium chloride flush, sodium phosphate   Microbiology: Results for orders placed or performed during the hospital encounter of 08/01/20  SARS Coronavirus 2 by RT PCR (hospital order, performed in [facility] hospital lab) Nasopharyngeal Nasopharyngeal Swab     Status: None   Collection Time: 08/01/20  3:47 PM   Specimen: Nasopharyngeal Swab  Result Value Ref Range Status   SARS Coronavirus 2 NEGATIVE NEGATIVE Final    Comment: (NOTE) SARS-CoV-2 target nucleic acids are NOT DETECTED.  The SARS-CoV-2 RNA is generally detectable in upper and lower respiratory specimens during the acute phase of infection. The lowest concentration of SARS-CoV-2 viral copies this assay can detect is 250 copies / mL. A negative result does not preclude SARS-CoV-2 infection and should not be used as the sole basis for treatment or other patient management decisions.  A negative result may occur with improper specimen collection / handling, submission of specimen other than nasopharyngeal swab, presence of viral mutation(s) within the areas targeted by this assay, and inadequate number of viral copies (<250 copies / mL). A negative result must be combined with clinical observations, patient history, and epidemiological information.  Fact Sheet for Patients:   StrictlyIdeas.no  Fact Sheet for Healthcare Providers: BankingDealers.co.za  This test is not yet approved or  cleared by the [place] FDA and has been authorized for detection and/or diagnosis of SARS-CoV-2 by FDA under an Emergency Use Authorization (EUA).  This EUA will remain in effect (meaning this test can be used) for the duration of the COVID-19 declaration  under Section 564(b)(1) of the Act, 21 U.S.C. section 360bbb-3(b)(1), unless the authorization is terminated or revoked sooner.  Performed at [facility], [address] [place], [place] [address]   CULTURE, BLOOD (ROUTINE X 2) w Reflex to ID Panel     Status: None   Collection Time: 08/01/20  5:42 PM   Specimen: BLOOD  Result Value Ref Range Status   Specimen Description BLOOD BLOOD LEFT HAND  Final   Special Requests   Final    BOTTLES DRAWN AEROBIC AND ANAEROBIC Blood Culture adequate volume   Culture   Final    NO GROWTH 5 DAYS Performed at [facility], [address], [place], [place] [address]    Report Status 08/06/2020 FINAL  Final  MRSA PCR Screening     Status: None   Collection Time: 08/01/20  5:55 PM   Specimen: Nasopharyngeal  Result Value Ref Range Status   MRSA by PCR NEGATIVE NEGATIVE Final    Comment:        The GeneXpert MRSA Assay (FDA approved for NASAL specimens only), is one component  of a comprehensive MRSA colonization surveillance program. It is not intended to diagnose MRSA infection nor to guide or monitor treatment for MRSA infections. Performed at [facility], [address] [place], [place] [address]   CULTURE, BLOOD (ROUTINE X 2) w Reflex to ID Panel     Status: None   Collection Time: 08/01/20  6:04 PM   Specimen: BLOOD  Result Value Ref Range Status   Specimen Description BLOOD BLOOD RIGHT HAND  Final   Special Requests   Final    BOTTLES DRAWN AEROBIC AND ANAEROBIC Blood Culture adequate volume   Culture   Final    NO GROWTH 5 DAYS Performed at [facility], [address] [place], [place] [address]    Report Status 08/06/2020 FINAL  Final  Culture, respiratory (non-expectorated)     Status: None   Collection Time: 08/06/20 12:29 PM   Specimen: Tracheal Aspirate; Respiratory  Result Value Ref Range Status   Specimen Description   Final    TRACHEAL ASPIRATE Performed at [facility], [address], [place], [place] [address]    Special Requests   Final    NONE Performed at [facility], [address] [place], [place] [address]    Gram Stain   Final    FEW WBC PRESENT, PREDOMINANTLY PMN MODERATE GRAM VARIABLE ROD Performed at [facility], [place] [address], [place],  [address]    Culture ABUNDANT PSEUDOMONAS AERUGINOSA  Final   Report Status 08/10/2020 FINAL  Final   Organism ID, Bacteria PSEUDOMONAS AERUGINOSA  Final      Susceptibility   Pseudomonas aeruginosa - MIC*    CEFTAZIDIME 4 SENSITIVE Sensitive     CIPROFLOXACIN >=4 RESISTANT Resistant     GENTAMICIN <=1 SENSITIVE Sensitive     IMIPENEM >=16 RESISTANT Resistant     PIP/TAZO >=128 RESISTANT Resistant     CEFEPIME 8 SENSITIVE Sensitive     * ABUNDANT PSEUDOMONAS AERUGINOSA   Results for orders placed or performed during the hospital encounter of 08/01/20 (from the past 24 hour(s))  Glucose, capillary     Status: Abnormal   Collection Time: 08/09/20  4:09 PM  Result Value Ref Range   Glucose-Capillary 173 (H) 70 - 99 mg/dL  Glucose, capillary     Status: Abnormal   Collection Time: 08/09/20  7:35 PM  Result Value Ref Range   Glucose-Capillary 147 (H) 70 - 99 mg/dL  Glucose, capillary     Status: Abnormal   Collection Time: 08/09/20 11:36 PM  Result Value Ref Range   Glucose-Capillary 147 (H) 70 - 99 mg/dL  Glucose, capillary     Status: Abnormal   Collection Time: 08/10/20  4:04 AM  Result Value Ref Range   Glucose-Capillary 151 (H) 70 - 99 mg/dL  Magnesium     Status: None   Collection Time: 08/10/20  5:22 AM  Result Value Ref Range   Magnesium 2.4 1.7 - 2.4 mg/dL  Phosphorus     Status: None   Collection Time: 08/10/20  5:22 AM  Result Value Ref Range   Phosphorus 2.8 2.5 - 4.6 mg/dL  Basic metabolic panel     Status: Abnormal   Collection Time: 08/10/20  5:22 AM  Result Value Ref Range   Sodium 138 135 - 145 mmol/L   Potassium 4.4 3.5 - 5.1  mmol/L   Chloride 104 98 - 111 mmol/L   CO2 29 22 - 32 mmol/L   Glucose, Bld 150 (  H) 70 - 99 mg/dL   BUN 23 (H) 6 - 20 mg/dL   Creatinine, Ser <0.30 (L) 0.44 - 1.00 mg/dL   Calcium 8.7 (L) 8.9 - 10.3 mg/dL   GFR calc non Af Amer NOT CALCULATED >60 mL/min   GFR calc Af Amer NOT CALCULATED >60 mL/min   Anion gap 5 5 - 15  CBC with Differential/Platelet     Status: Abnormal   Collection Time: 08/10/20  5:22 AM  Result Value Ref Range   WBC 15.3 (H) 4.0 - 10.5 K/uL   RBC 3.57 (L) 3.87 - 5.11 MIL/uL   Hemoglobin 11.2 (L) 12.0 - 15.0 g/dL   HCT 34.0 (L) 36 - 46 %   MCV 95.2 80.0 - 100.0 fL   MCH 31.4 26.0 - 34.0 pg   MCHC 32.9 30.0 - 36.0 g/dL   RDW 14.8 11.5 - 15.5 %   Platelets 285 150 - 400 K/uL   nRBC 0.0 0.0 - 0.2 %   Neutrophils Relative % 63 %   Neutro Abs 9.7 (H) 1.7 - 7.7 K/uL   Lymphocytes Relative 19 %   Lymphs Abs 3.0 0.7 - 4.0 K/uL   Monocytes Relative 11 %   Monocytes Absolute 1.7 (H) 0 - 1 K/uL   Eosinophils Relative 5 %   Eosinophils Absolute 0.7 (H) 0 - 0 K/uL   Basophils Relative 1 %   Basophils Absolute 0.1 0 - 0 K/uL   Immature Granulocytes 1 %   Abs Immature Granulocytes 0.18 (H) 0.00 - 0.07 K/uL  Glucose, capillary     Status: Abnormal   Collection Time: 08/10/20  7:41 AM  Result Value Ref Range   Glucose-Capillary 144 (H) 70 - 99 mg/dL  Glucose, capillary     Status: Abnormal   Collection Time: 08/10/20 11:34 AM  Result Value Ref Range   Glucose-Capillary 149 (H) 70 - 99 mg/dL    Best Practice/Protocols:  VTE Prophylaxis: Lovenox (prophylaxtic dose) GI Prophylaxis: Antihistamine ICU hypo/hyperglycemia  Events:   Studies: DG Abd 1 View  Result Date: 08/10/2020 CLINICAL DATA:  Diverticulitis. EXAM: ABDOMEN - 1 VIEW COMPARISON:  CT 08/09/2020.  Abdomen series 08/06/2020. FINDINGS: Percutaneous gastrostomy tube noted in stable position. Air-filled loops of slightly prominent small bowel noted. Oral contrast noted in the colon. Findings suggest mild  adynamic ileus. Free air present best demonstrated on prior CT. Spine scoliosis concave right. Diffuse degenerative change lumbar spine. IMPRESSION: 1.  Percutaneous gastrostomy tube in stable position. 2. Air-filled loops of slightly prominent small bowel noted. Oral contrast noted in the colon. Findings suggest mild adynamic ileus. Free air present best demonstrated on prior CT. Electronically Signed   By: Marcello Moores  Register   On: 08/10/2020 12:44   CT ABDOMEN PELVIS W CONTRAST  Result Date: 08/09/2020 CLINICAL DATA:  Suspected diverticulitis, follow-up pneumoperitoneum EXAM: CT ABDOMEN AND PELVIS WITH CONTRAST TECHNIQUE: Multidetector CT imaging of the abdomen and pelvis was performed using the standard protocol following bolus administration of intravenous contrast. CONTRAST:  173m OMNIPAQUE IOHEXOL 300 MG/ML  SOLN COMPARISON:  08/05/2020 FINDINGS: Lower chest: Heterogeneous airspace opacity of the right lung base with a small associated right pleural effusion. Bilateral breast implants Hepatobiliary: No solid liver abnormality is seen. No gallstones, gallbladder wall thickening, or biliary dilatation. Pancreas: Unremarkable. No pancreatic ductal dilatation or surrounding inflammatory changes. Spleen: Normal in size without significant abnormality. Adrenals/Urinary Tract: Adrenal glands are unremarkable. Redemonstrated nonobstructive right nephrolithiasis. No hydronephrosis. Foley catheter in the urinary bladder. Stomach/Bowel: Redemonstrated percutaneous gastrostomy  tube. Appendix appears normal. No evidence of bowel wall thickening, distention, or inflammatory changes. Severe pancolonic diverticulosis. Vascular/Lymphatic: No significant vascular findings are present. No enlarged abdominal or pelvic lymph nodes. Reproductive: No mass or other significant abnormality. Other: No abdominal wall hernia or abnormality. Small volume pneumoperitoneum seen on prior examination is persistent although reduced in  volume (series 2, image 46). Musculoskeletal: No acute or significant osseous findings. Profound sarcopenia throughout. IMPRESSION: 1. Small volume pneumoperitoneum seen on prior examination is persistent although reduced in volume. As on prior examination, there is no definite etiology identified. Of note, mesenteric air loculations seen on prior examination are resolved. 2. Previously seen rectal wall thickening is resolved. 3. Heterogeneous airspace opacity of the right lung base with a small associated right pleural effusion, most consistent with infection or aspiration. 4. Severe pancolonic diverticulosis without evidence of acute diverticulitis. 5. Redemonstrated percutaneous gastrostomy tube. 6. Nonobstructive right nephrolithiasis. Electronically Signed   By: Eddie Candle M.D.   On: 08/09/2020 13:07   CT ABDOMEN PELVIS W CONTRAST  Result Date: 08/05/2020 CLINICAL DATA:  Abdominal abscess or infection suspected, history of diverticulitis, persistent leukocytosis EXAM: CT ABDOMEN AND PELVIS WITH CONTRAST TECHNIQUE: Multidetector CT imaging of the abdomen and pelvis was performed using the standard protocol following bolus administration of intravenous contrast. CONTRAST:  116m OMNIPAQUE IOHEXOL 300 MG/ML SOLN, additional oral enteric contrast COMPARISON:  08/01/2020 FINDINGS: Lower chest: Small right pleural effusion and associated atelectasis or consolidation. Bilateral breast implants. Hepatobiliary: No solid liver abnormality is seen. No gallstones, gallbladder wall thickening, or biliary dilatation. Pancreas: Unremarkable. No pancreatic ductal dilatation or surrounding inflammatory changes. Spleen: Normal in size without significant abnormality. Adrenals/Urinary Tract: Adrenal glands are unremarkable. Nonobstructive right nephrolithiasis. No hydronephrosis. Foley catheter in the urinary bladder, containing air within the bladder lumen. Bladder is unremarkable. Stomach/Bowel: Percutaneous gastrostomy.  Appendix is not clearly visualized and may be surgically absent. Severe pancolonic diverticulosis. There is wall thickening of the distal sigmoid colon and rectum with interval disimpaction of large stool balls previously seen in the rectum. Vascular/Lymphatic: Aortic atherosclerosis. No enlarged abdominal or pelvic lymph nodes. Reproductive: No mass or other significant abnormality. Other: No abdominal wall hernia or abnormality. There is small volume pneumoperitoneum in the ventral abdomen, with small loculations of air in the small bowel mesentery and sigmoid mesocolon (series 5, image 25, series 2, image 70). Musculoskeletal: No acute or significant osseous findings. Severe sarcopenia throughout the included musculature. IMPRESSION: 1. There is small volume pneumoperitoneum in the ventral abdomen, with small loculations of air in the small bowel mesentery and sigmoid mesocolon. Findings are consistent with bowel perforation although of uncertain etiology, possibly related to mechanical disimpaction or perforated stercoral ulceration given previously noted large stool balls in the rectum. 2. There is wall thickening of the distal sigmoid colon and rectum suggestive of nonspecific infectious or inflammatory proctocolitis. 3. Severe pancolonic diverticulosis without evidence of acute diverticulitis. 4. Small right pleural effusion and associated atelectasis or consolidation. 5. Nonobstructive right nephrolithiasis. 6. Severe sarcopenia. 7. Aortic Atherosclerosis (ICD10-I70.0). These results will be called to the ordering clinician or representative by the Radiologist Assistant, and communication documented in the PACS or [employer] Electronically Signed   By: AEddie CandleM.D.   On: 08/05/2020 11:35   CT ABDOMEN PELVIS W CONTRAST  Result Date: 08/01/2020 CLINICAL DATA:  Abdominal distension, lower abdominal pain EXAM: CT ABDOMEN AND PELVIS WITH CONTRAST TECHNIQUE: Multidetector CT imaging of the abdomen  and pelvis was performed using the standard protocol following  bolus administration of intravenous contrast. CONTRAST:  173m OMNIPAQUE IOHEXOL 300 MG/ML  SOLN COMPARISON:  May 01, 2013 FINDINGS: Lower chest: Basilar atelectasis. No consolidation. No pleural effusion. Hepatobiliary: Liver without focal, suspicious lesion. Portal vein is patent. No pericholecystic stranding. No biliary duct dilation. Pancreas: Pancreas is normal without ductal dilation or inflammation. Spleen: Spleen normal in size and contour. Adrenals/Urinary Tract: Adrenal glands are normal. No signs of hydronephrosis. Nephrolithiasis. Large calculus in the RIGHT upper pole measuring approximately 1.6 x 1.0 cm. Smaller calculi in the RIGHT renal pelvis and a smaller interpolar calculus in an interpolar calyx measuring approximately 7 mm. Normal appearance of the LEFT kidney Stomach/Bowel: Signs of acute diverticulitis along the descending sigmoid junction. Rectal distension up to 8 cm.  Pancolonic diverticulosis. Gastric tube in situ similar to the prior study. No acute small bowel process. Vascular/Lymphatic: Scattered atheromatous plaque of the abdominal aorta tracking in the iliac vessels. No adenopathy in the retroperitoneum. No adenopathy in the pelvis. Reproductive: No adnexal mass. Other: No free air.  No abscess. Musculoskeletal: Thinning of the sacrum and marked osteopenia with loss of muscular density, replacement with fatty atrophy. Bilateral breast implants. No acute musculoskeletal process. IMPRESSION: 1. Signs of acute diverticulitis along the descending sigmoid junction in the setting of pancolonic diverticulosis. No abscess or free air. 2. Fecal impaction 3. RIGHT-sided nephrolithiasis with large calculi as described. 4. Muscular atrophy and osteopenia likely related to primary diagnosis of ALS. Thinning of the sacrococcygeal region with respect of bone may relate to prior pressure ulceration. There is no stranding in the fat to  suggest decubitus changes at this time in this location. Mild stranding is noted overlying the RIGHT ischium. 5. Aortic atherosclerosis. Aortic Atherosclerosis (ICD10-I70.0). Electronically Signed   By: GZetta BillsM.D.   On: 08/01/2020 15:21   DG Chest Port 1 View  Result Date: 08/04/2020 CLINICAL DATA:  Leukocytosis. EXAM: PORTABLE CHEST 1 VIEW COMPARISON:  Chest radiograph 08/31/2016. Lung bases from abdominal CT 08/01/2020 FINDINGS: Tracheostomy tube tip at the thoracic inlet. Right upper extremity PICC tip in the lower SVC. Chronic elevation of right hemidiaphragm. Adjacent compressive atelectasis/scarring at the right lung base. There is mild biapical pleuroparenchymal scarring. No acute or confluent airspace disease. No pleural effusion or pneumothorax. No acute osseous abnormalities are seen. IMPRESSION: 1. Chronic elevation of right hemidiaphragm with chronic atelectasis or scarring at the right lung base. 2. Tracheostomy tube tip at the thoracic inlet. 3. Right upper extremity PICC tip in the lower SVC. Electronically Signed   By: MKeith RakeM.D.   On: 08/04/2020 17:28   DG Abd Portable 2V  Result Date: 08/06/2020 CLINICAL DATA:  Diverticulitis.  Follow-up. EXAM: PORTABLE ABDOMEN - 2 VIEW COMPARISON:  CT abdomen done yesterday. FINDINGS: Previously administered contrast now present throughout the colon. Gastrostomy tube in place. No sign of bowel obstruction. No visible free air. Curvature and chronic degenerative changes of the spine. IMPRESSION: Previously administered contrast now present throughout the colon. No sign of bowel obstruction or free air. Electronically Signed   By: MNelson ChimesM.D.   On: 08/06/2020 11:48   [place] SITE RITE  Result Date: 08/02/2020 [facility], placement could not be confirmed due to current cardiac rhythm.   Consults: Treatment Team:  PJules Husbands MD SAbbie Sons MD   Subjective:    Overnight Issues: Overnight no  issues.  Remains on home vent without distress.Tearful today.Some abdominal discomfort but no frank pain.  Objective:  Vital signs for last 24 hours: Temp:  [98 F (36.7 C)-98.8 F (37.1 C)] 98.3 F (36.8 C) (08/31 1200) Pulse Rate:  [72-126] 72 (08/31 1300) Resp:  [12-27] 12 (08/31 1300) BP: (82-123)/(46-87) 91/53 (08/31 1300) SpO2:  [84 %-99 %] 99 % (08/31 1300) FiO2 (%):  [28 %] 28 % (08/31 1200) Weight:  [71.9 kg] 71.9 kg (08/31 0500)  Hemodynamic parameters for last 24 hours:    Intake/Output from previous day: 08/30 0701 - 08/31 0700 In: 1307.2 [I.V.:1211.1; IV Piggyback:96.1] Out: 2000 [Urine:1850; Drains:150]  Intake/Output this shift: Total I/O In: 1176.7 [I.V.:1027.2; IV Piggyback:149.5] Out: 425 [Urine:425]  Vent settings for last 24 hours: Vent Mode: AC FiO2 (%):  [28 %] 28 % Set Rate:  [12 bmp] 12 bmp Vt Set:  [450 mL] 450 mL PEEP:  [5 cmH20] 5 cmH20 Plateau Pressure:  [8 cmH20] 8 cmH20  Physical Exam:  GENERAL: Chronically ill appearing female, laying in bed, on chronic vent via Trach, in NAD. Temporal wasting. NECK: Neck supple, Tracheostomy in place  PULMONARY: Clear to auscultation bilaterally, no wheezing or rales, symmetrical CARDIOVASCULAR:Regular rate & rhythm, s1s2, no M/R/G  GASTROINTESTINAL: Soft,distended,+tenderness to LLQ, no guarding or rebound tenderness, BS+ x4  MUSCULOSKELETAL: No swelling, no clubbing, no edema. Diffuse muscle wasting. 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  No obvious rashes, lesions, or ulcerations   Assessment/Plan:  Acute SEPSIS due to acute DIVERTICULITIS Contained pneumoperitoneum Pseudomonas aeruginosa pneumonia  -Monitor fever curve  -Trend WBC 's, on upward trend -Pseudomonas aeruginosa is multidrug-resistant -Check with microbiology lab sensitivities updated -Patient will be placed on cefepime for pneumonia coverage -Flagyl for diverticulitis -Add antifungal due to contain  pneumoperitoneum/perforation -General Surgery following, appreciate input ~ No need for surgical intervention at this time per Surgery -Pain control -PRN Antiemetics -Follow abdominal exam -Follow-up with general surgery with regards to the above -Patient's mother wants her to come home -Determining home antibiotic regimen   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  Discussed with patient's mother, Early Osmond, at length via telephone conversation today,she was updated in full.  Critical Care Total Time*: 35 Minutes  C. Derrill Kay, MD [facility] 08/10/2020  *Care during the described time interval was provided by me and/or other providers on the critical care team.  I have reviewed this patient's available data, including medical history, events of note, physical examination and test results as part of my evaluation.  **This note was dictated using voice recognition software/Dragon.  Despite best efforts to proofread, errors can occur which can change the meaning.  Any change was purely unintentional.

## 2020-08-10 NOTE — Progress Notes (Signed)
Pt resting in bed, on home vent support, with chronic trach. VSS. Son at bedside. No s/s of distress noted. Pt denies pain at this time but did require morphine once throughout shift. Tube feeds started today and TPN decreased.

## 2020-08-10 NOTE — Progress Notes (Signed)
PHARMACY - TOTAL PARENTERAL NUTRITION CONSULT NOTE   Indication: prolonged inability for tube feeds  Patient Measurements: Height: 5' 5.98" (167.6 cm) Weight: 71.9 kg (158 lb 8.2 oz) IBW/kg (Calculated) : 59.26 TPN AdjBW (KG): 71.1 Body mass index is 25.6 kg/m.  Assessment: 49 year old female admitted with diverticulitis. Plan was to possibly restart tube feeds after holding for several days. Repeat CT A/P with increase in pneumoperitoneum although patient clinically improving. Plan to continue conservative management for now. Patient to start on TPN as tube feeds will continue to be held.  Glucose / Insulin: no SSI BG 147 - 173 Electrolytes: WNL today Renal: Scr<1, stable LFTs / TGs: TG 194-->191 Prealbumin / albumin: albumin 4 MIVF: none GI Imaging:  8/27 No sign of bowel obstruction or free air  Central access: PICC 8/27 TPN start date: 8/27  Nutritional Goals (per RD recommendation on 8/27): kCal: 1420, Protein: 70-85 g/day, Fluid: 1.2-1.4 L/day Goal TPN rate is ~55 mL/hr (provides 81 g of protein and 1420 kcals per day)  Current Nutrition: CLD through PEG  Plan:   Tube feeds are being restarted (per RD recommendations) and TPN is being tapered  taper TPN to 30 mL/hr  Electrolytes in TPN: 50 mEq/L of Na, 50 mEq/L of K, 5 mEq/L of Ca, 5 mEq/L of Mg, and 15 mmol/L of Phos. Cl:Ac ratio 1:1  Add standard MVI and trace elements to TPN  Issues with hypoglycemia - no SSI insulin at this time  Monitor TPN labs on Mon/Thurs, daily for now s/t electrolyte abnormalities  Lowella Bandy, PharmD, BCPS 08/10/2020,7:10 AM

## 2020-08-10 NOTE — Progress Notes (Signed)
Nutrition Follow-up  DOCUMENTATION CODES:   Not applicable  INTERVENTION:  Continue TPN per pharmacy. Plan is to wean to 1/2 rate today.  Initiate Osmolite 1.5 Cal at 20 mL/hr today.  Tomorrow recommend advancing to Osmolite 1.5 Cal at 40 mL/hr per tube + PROSource TF 45 mL once daily per tube. Provides 1480 kcal, 71 grams of protein, 730 mL H2O daily.  NUTRITION DIAGNOSIS:   Inadequate oral intake related to inability to eat, dysphagia as evidenced by NPO status (reliance on tube feeds and free water flush via G-tube to meet calorie/protein/hydration needs.).  Ongoing.  GOAL:   Patient will meet greater than or equal to 90% of their needs  Met with TPN.  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  8/27 TPN initiated   Patient is currently on ventilator support via trach on home vent MV: 5 L/min Temp (24hrs), Avg:98.3 F (36.8 C), Min:98 F (36.7 C), Max:98.8 F (37.1 C)  Medications reviewed and include: famotidine, anidulafungin, cefepime.  Labs reviewed: CBG 144-151, BUN 23, Creatinine <0.3. Potassium and Phosphorus WNL.  I/O: 1850 mL  UOP yesterday (1.1 mL/kg/hr)  Weight trend: 71.9 kg on 8/31; +0.8 kg from 8/22  Enteral Access: 18 Fr. G-tube present on admission  IV Access: right brachial double lumen PICC placed 8/23  TPN Prescriptions: Clinosol 15% 60 grams/L, dextrose 16%, SMOFlipid 30 grams/L at 55 mL/hr (1431 kcal, 79.2 grams protein daily)  Per Surgery okay to resume tube feeds today and begin weaning TPN. Discussed with RN and on rounds. Plan after discussion with Surgery PA in secure chat is to resume fiber-free Osmolite at this time. Possible transition back to [place] closer to discharge or in outpatient setting. Met with patient and discussed plan of  switching to Osmolite for now and resuming tube feeds at low rate today. Patient nodded in agreement to plan.  Diet Order:   Diet Order            Diet NPO time specified  Diet effective now                EDUCATION NEEDS:   No education needs have been identified at this time  Skin:  Skin Assessment: Reviewed RN Assessment  Last BM:  08/05/2020 per chart  Height:   Ht Readings from Last 1 Encounters:  08/09/20 5' 5.98" (1.676 m)   Weight:   Wt Readings from Last 1 Encounters:  08/10/20 71.9 kg   BMI:  Body mass index is 25.6 kg/m.  Estimated Nutritional Needs:   Kcal:  1455  Protein:  70-85 grams  Fluid:  1.2-1.4 L/day  Jacklynn Barnacle, MS, RD, LDN Pager number available on Amion

## 2020-08-10 NOTE — TOC Progression Note (Signed)
Transition of Care [facility]) - Progression Note    Patient Details  Name: AHLAM PISCITELLI MRN: [id number] Date of Birth: 1971-03-24  [facility]) CM/SW Contact  Marina Goodell Phone Number:  [phone] 08/10/2020, 12:35 PM  Clinical Narrative:     CSW spoke with Molly Maduro from [facility] and to update on patient's current disposition, with the patient's mother, Ms. Smith's, permission. CSW spoke with patient's mother, Ms. Katrinka Blazing, and she had some questions about the Attending's recommendation during rounds on 8/30, for LTAC disposition.  Ms. Katrinka Blazing also has questions about the patient's CT scan for her abdomen. This CSW reached out to Drs. Jayme Cloud and [employer] and requested for one of them to contact Ms. Smith.  Dr. Jayme Cloud stated she would call Ms. Katrinka Blazing sometime today.  CSW spoke with Ms. Katrinka Blazing and updated her on the phone call from Dr. Jayme Cloud sometime today.  Ms. Katrinka Blazing verbalized understanding.        Expected Discharge Plan and Services                                                 Social Determinants of Health (SDOH) Interventions    Readmission Risk Interventions No flowsheet data found.

## 2020-08-10 NOTE — Progress Notes (Signed)
[facility] SURGICAL PROGRESS NOTE (cpt [id number])  Hospital Day(s): 9.   Interval History:  Patient seen and examined no acute events or new complaints overnight.  Patient resting comfortably, does shake her head "no" when asked about abdominal pain Relatively stable, but persistent, leukocytosis to 15.3K Renal function remains normal with sCr - <0.30, good UO - 1.8L No electrolyte derangement Repeat CT Abdomen/Pelvis yesterday (08/30) was improved Remains NPO, TPN  Review of Systems:  Unable to reliably preform secondary to non-verbal status   Vital signs in last 24 hours: [min-max] current  Temp:  [98 F (36.7 C)-98.9 F (37.2 C)] 98.3 F (36.8 C) (08/31 0400) Pulse Rate:  [82-142] 91 (08/31 0700) Resp:  [12-31] 17 (08/31 0700) BP: (77-123)/(46-87) 101/61 (08/31 0700) SpO2:  [84 %-99 %] 99 % (08/31 0700) FiO2 (%):  [28 %] 28 % (08/31 0242) Weight:  [71.9 kg] 71.9 kg (08/31 0500)     Height: 5' 5.98" (167.6 cm) Weight: 71.9 kg BMI (Calculated): 25.6   Intake/Output last 2 shifts:  08/30 0701 - 08/31 0700 In: 1307.2 [I.V.:1211.1; IV Piggyback:96.1] Out: 2000 [Urine:1850; Drains:150]   Physical Exam:  Constitutional:Somnolent, arouses, shakes head to answer very simple questions HENT: normocephalic without obvious abnormality, tracheostomy present Respiratory:On ventilator Cardiovascular:regular rateand sinus rhythm  Gastrointestinal: soft,very mild LLQ soreness if any, no evidence of peritonitis although this is very limited given her lack of participation in examination and 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 Genitourinary: Foley in palce  Labs:  CBC Latest Ref Rng & Units 08/10/2020 08/09/2020 08/07/2020  WBC 4.0 - 10.5 K/uL 15.3(H) 12.6(H) 8.2  Hemoglobin 12.0 - 15.0 g/dL 11.2(L) 11.7(L) 11.9(L)  Hematocrit 36 - 46 % 34.0(L) 35.9(L) 35.9(L)  Platelets 150 - 400 K/uL 285 235 251   CMP Latest Ref Rng & Units 08/10/2020 08/09/2020 08/08/2020  Glucose 70 - 99  mg/dL 053(Z) 767(H) 419(F)  BUN 6 - 20 mg/dL 79(K) 19 14  Creatinine 0.44 - 1.00 mg/dL <2.40(X) <7.35(H) <2.99(M)  Sodium 135 - 145 mmol/L 138 139 141  Potassium 3.5 - 5.1 mmol/L 4.4 4.5 3.3(L)  Chloride 98 - 111 mmol/L 104 105 102  CO2 22 - 32 mmol/L 29 28 30   Calcium 8.9 - 10.3 mg/dL ) 4.2(A) 8.3(M)  Total Protein 6.5 - 8.1 g/dL - 6.4(L) -  Total Bilirubin 0.3 - 1.2 mg/dL - 0.3 -  Alkaline Phos 38 - 126 U/L - 76 -  AST 15 - 41 U/L - 40 -  ALT 0 - 44 U/L - 36 -     Imaging studies: No new pertinent imaging studies   Assessment/Plan: (ICD-10's: K68.92) 49 y.o. female with acute uncomplicated diverticulitisfound to have increase in pneumoperitoneum on CT A/P (08/26) which is improved on repeat imaging (08/30), and she is otherwise clinically improving and without overt evidence of peritonitis or sepsis   - Okay with resuming tube feedings and weaning from TPN    - Continue IV Abx (Zosyn) - Monitor leukocytosis, electrolytes  - Monitor abdominal examination - Pain control prn; antiemetics prn - No emergent surgical intervention - Appreciatepalliative consultation; full scope - Further management per primary service; we will follow  All of the above findings and recommendations were discussed with themedical team.  -- 06-[birth year]-1999, PA-C [facility] 08/10/2020, 7:48 AM [phone] M-F: 7am - 4pm

## 2020-08-11 LAB — GLUCOSE, CAPILLARY
Glucose-Capillary: 140 mg/dL — ABNORMAL HIGH (ref 70–99)
Glucose-Capillary: 138 mg/dL — ABNORMAL HIGH (ref 70–99)
Glucose-Capillary: 151 mg/dL — ABNORMAL HIGH (ref 70–99)
Glucose-Capillary: 124 mg/dL — ABNORMAL HIGH (ref 70–99)
Glucose-Capillary: 110 mg/dL — ABNORMAL HIGH (ref 70–99)

## 2020-08-11 LAB — CBC
HCT: 32.5 % — ABNORMAL LOW (ref 36.0–46.0)
Hemoglobin: 10.8 g/dL — ABNORMAL LOW (ref 12.0–15.0)
MCH: 32.3 pg (ref 26.0–34.0)
MCHC: 33.2 g/dL (ref 30.0–36.0)
MCV: 97.3 fL (ref 80.0–100.0)
Platelets: 328 10*3/uL (ref 150–400)
RBC: 3.34 MIL/uL — ABNORMAL LOW (ref 3.87–5.11)
RDW: 15.4 % (ref 11.5–15.5)
WBC: 14.4 10*3/uL — ABNORMAL HIGH (ref 4.0–10.5)
nRBC: 0 % (ref 0.0–0.2)

## 2020-08-11 LAB — BASIC METABOLIC PANEL
Anion gap: 8 (ref 5–15)
BUN: 21 mg/dL — ABNORMAL HIGH (ref 6–20)
CO2: 27 mmol/L (ref 22–32)
Calcium: 8.8 mg/dL — ABNORMAL LOW (ref 8.9–10.3)
Chloride: 103 mmol/L (ref 98–111)
Creatinine, Ser: <0.3 mg/dL — ABNORMAL LOW (ref 0.44–1.00)
Glucose, Bld: 139 mg/dL — ABNORMAL HIGH (ref 70–99)
Potassium: 4.3 mmol/L (ref 3.5–5.1)
Sodium: 138 mmol/L (ref 135–145)

## 2020-08-11 LAB — MAGNESIUM: Magnesium: 2.3 mg/dL (ref 1.7–2.4)

## 2020-08-11 LAB — PHOSPHORUS: Phosphorus: 3.5 mg/dL (ref 2.5–4.6)

## 2020-08-11 MED ORDER — ALPRAZOLAM 0.25 MG PO TABS
0.2500 mg | ORAL_TABLET | Freq: Once | ORAL | Status: AC
  Administered 2020-08-11: 0.25 mg
  Filled 2020-08-11: qty 1

## 2020-08-11 MED ORDER — PROSOURCE TF PO LIQD
45.0000 mL | Freq: Every day | ORAL | Status: DC
  Administered 2020-08-11 – 2020-08-13 (×3): 45 mL
  Filled 2020-08-11: qty 45

## 2020-08-11 MED ORDER — OSMOLITE 1.5 CAL PO LIQD
1000.0000 mL | ORAL | Status: DC
  Administered 2020-08-11 – 2020-08-12 (×2): 1000 mL

## 2020-08-11 NOTE — Progress Notes (Signed)
Follow up - Critical Care Medicine Note  Patient Details:    Brenda Anderson is an 49 y.o. 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  Patient presented with sepsis noted to have diverticulitis with contained pneumoperitoneum.  08/11/20- no events today, discussed with TCC regarding LTAC. Plan to potentially d/c home. Mother of patient does not wish for LTAC. Patient had large loose stool bm today.   Lines, Airways, Drains: PICC Double Lumen 08/02/20 PICC Right Brachial 39 cm 1 cm (Active)  Indication for Insertion or Continuance of Line Prolonged intravenous therapies 08/09/20 1200  Exposed Catheter (cm) 1 cm 08/07/20 0900  Site Assessment Clean;Dry;Intact 08/09/20 1200  Lumen #1 Status Infusing 08/09/20 1200  Lumen #2 Status Infusing 08/09/20 1200  Dressing Type Transparent;Occlusive 08/09/20 1200  Dressing Status Clean;Dry;Intact;Antimicrobial disc in place 08/09/20 1200  Safety Lock Not Applicable 77/82/42 3536  Line Care Connections checked and tightened 08/09/20 0900  Line Adjustment (NICU/IV Team Only) No 08/06/20 2000  Dressing Intervention New dressing 08/02/20 1600  Dressing Change Due 08/09/20 08/07/20 1930     Gastrostomy/Enterostomy Percutaneous endoscopic gastrostomy (PEG) RUQ (Active)  Surrounding Skin Dry;Intact 08/09/20 1217  Tube Status Clamped 08/09/20 1217  Drainage Appearance Bile;Green 08/08/20 2000  Dressing Status Clean;Dry;Intact 08/09/20 1217  Dressing Intervention New dressing 08/08/20 0330  Dressing Type Split gauze 08/09/20 1217  Dressing Change Due 08/08/20 08/07/20 1930  Output (mL) 0 mL 08/09/20 1217     Urethral Catheter Urologist Coude;Non-latex (Active)  Indication for Insertion or Continuance of Catheter Chronic catheter use 08/09/20 0749  Site Assessment Clean;Intact 08/09/20 1217  Catheter Maintenance Bag below level of bladder;Catheter secured;No dependent loops;Drainage bag/tubing not touching floor 08/09/20 1217  Collection Container  Standard drainage bag 08/09/20 1217  Securement Method Securing device (Describe) 08/09/20 1217  Urinary Catheter Interventions (if applicable) Unclamped 14/43/15 0751  Output (mL) 150 mL 08/09/20 1217    Anti-infectives:  Anti-infectives (From admission, onward)   Start     Dose/Rate Route Frequency Ordered Stop   08/11/20 1200  anidulafungin (ERAXIS) 100 mg in sodium chloride 0.9 % 100 mL IVPB        100 mg 78 mL/hr over 100 Minutes Intravenous Every 24 hours 08/10/20 0958     08/10/20 2200  metroNIDAZOLE (FLAGYL) IVPB 500 mg        500 mg 100 mL/hr over 60 Minutes Intravenous Every 8 hours 08/10/20 1325     08/10/20 1400  ceFEPIme (MAXIPIME) 2 g in sodium chloride 0.9 % 100 mL IVPB        2 g 200 mL/hr over 30 Minutes Intravenous Every 8 hours 08/10/20 1135     08/10/20 1200  anidulafungin (ERAXIS) 200 mg in sodium chloride 0.9 % 200 mL IVPB        200 mg 78 mL/hr over 200 Minutes Intravenous  Once 08/10/20 0958 08/10/20 1458   08/02/20 0000  piperacillin-tazobactam (ZOSYN) IVPB 3.375 g  Status:  Discontinued        3.375 g 12.5 mL/hr over 240 Minutes Intravenous Every 8 hours 08/01/20 2210 08/10/20 1135   08/01/20 1530  piperacillin-tazobactam (ZOSYN) IVPB 3.375 g        3.375 g 100 mL/hr over 30 Minutes Intravenous  Once 08/01/20 1524 08/01/20 1653     Scheduled Meds: . baclofen  10 mg Per Tube TID  . buPROPion  100 mg Per Tube TID  . chlorhexidine gluconate (MEDLINE KIT)  15 mL Mouth Rinse BID  . Chlorhexidine Gluconate Cloth  6 each Topical Daily  . citalopram  20 mg Per Tube Daily  . famotidine  20 mg Per Tube BID  . feeding supplement (OSMOLITE 1.5 CAL)  1,000 mL Per Tube Q24H  . feeding supplement (PROSource TF)  45 mL Per Tube Daily  . fluticasone  1 spray Each Nare Daily  . mouth rinse  15 mL Mouth Rinse 10 times per day  . melatonin  7.5 mg Per Tube QHS  . midodrine  5 mg Oral TID WC  . montelukast  10 mg Per Tube QHS  . pregabalin  100 mg Per Tube BID  .  sodium chloride flush  10-40 mL Intracatheter Q12H   Continuous Infusions: . anidulafungin 100 mg (08/11/20 1308)  . ceFEPime (MAXIPIME) IV 2 g (08/11/20 1310)  . metronidazole 500 mg (08/11/20 1314)  . TPN ADULT (ION) 30 mL/hr at 08/10/20 1739   PRN Meds:.HYDROcodone-acetaminophen, morphine injection, naphazoline-glycerin, ondansetron (ZOFRAN) IV, polyethylene glycol, pregabalin, promethazine, sodium chloride flush, sodium phosphate   Microbiology: Results for orders placed or performed during the hospital encounter of 08/01/20  SARS Coronavirus 2 by RT PCR (hospital order, performed in [facility] hospital lab) Nasopharyngeal Nasopharyngeal Swab     Status: None   Collection Time: 08/01/20  3:47 PM   Specimen: Nasopharyngeal Swab  Result Value Ref Range Status   SARS Coronavirus 2 NEGATIVE NEGATIVE Final    Comment: (NOTE) SARS-CoV-2 target nucleic acids are NOT DETECTED.  The SARS-CoV-2 RNA is generally detectable in upper and lower respiratory specimens during the acute phase of infection. The lowest concentration of SARS-CoV-2 viral copies this assay can detect is 250 copies / mL. A negative result does not preclude SARS-CoV-2 infection and should not be used as the sole basis for treatment or other patient management decisions.  A negative result may occur with improper specimen collection / handling, submission of specimen other than nasopharyngeal swab, presence of viral mutation(s) within the areas targeted by this assay, and inadequate number of viral copies (<250 copies / mL). A negative result must be combined with clinical observations, patient history, and epidemiological information.  Fact Sheet for Patients:   StrictlyIdeas.no  Fact Sheet for Healthcare Providers: BankingDealers.co.za  This test is not yet approved or  cleared by the [place] FDA and has been authorized for detection and/or diagnosis of  SARS-CoV-2 by FDA under an Emergency Use Authorization (EUA).  This EUA will remain in effect (meaning this test can be used) for the duration of the COVID-19 declaration under Section 564(b)(1) of the Act, 21 U.S.C. section 360bbb-3(b)(1), unless the authorization is terminated or revoked sooner.  Performed at [facility], [address] [place], [place] [address]   CULTURE, BLOOD (ROUTINE X 2) w Reflex to ID Panel     Status: None   Collection Time: 08/01/20  5:42 PM   Specimen: BLOOD  Result Value Ref Range Status   Specimen Description BLOOD BLOOD LEFT HAND  Final   Special Requests   Final    BOTTLES DRAWN AEROBIC AND ANAEROBIC Blood Culture adequate volume   Culture   Final    NO GROWTH 5 DAYS Performed at [facility], [address], [place], [place] [address]    Report Status 08/06/2020 FINAL  Final  MRSA PCR Screening     Status: None   Collection Time: 08/01/20  5:55 PM   Specimen: Nasopharyngeal  Result Value Ref Range Status   MRSA by PCR NEGATIVE NEGATIVE Final  Comment:        The GeneXpert MRSA Assay (FDA approved for NASAL specimens only), is one component of a comprehensive MRSA colonization surveillance program. It is not intended to diagnose MRSA infection nor to guide or monitor treatment for MRSA infections. Performed at [facility], [address] [place], [place] [address]   CULTURE, BLOOD (ROUTINE X 2) w Reflex to ID Panel     Status: None   Collection Time: 08/01/20  6:04 PM   Specimen: BLOOD  Result Value Ref Range Status   Specimen Description BLOOD BLOOD RIGHT HAND  Final   Special Requests   Final    BOTTLES DRAWN AEROBIC AND ANAEROBIC Blood Culture adequate volume   Culture   Final    NO GROWTH 5 DAYS Performed at [facility], [address], [place], [place] [address]    Report Status 08/06/2020 FINAL  Final  Culture, respiratory (non-expectorated)     Status: None   Collection Time:  08/06/20 12:29 PM   Specimen: Tracheal Aspirate; Respiratory  Result Value Ref Range Status   Specimen Description   Final    TRACHEAL ASPIRATE Performed at [facility], [address] [place], [place] [address]    Special Requests   Final    NONE Performed at [facility], [address] [place], [place] [address]    Gram Stain   Final    FEW WBC PRESENT, PREDOMINANTLY PMN MODERATE GRAM VARIABLE ROD Performed at [facility], [address] [place], [place] [address]    Culture ABUNDANT PSEUDOMONAS AERUGINOSA  Final   Report Status 08/10/2020 FINAL  Final   Organism ID, Bacteria PSEUDOMONAS AERUGINOSA  Final      Susceptibility   Pseudomonas aeruginosa - MIC*    CEFTAZIDIME 4 SENSITIVE Sensitive     CIPROFLOXACIN >=4 RESISTANT Resistant     GENTAMICIN <=1 SENSITIVE Sensitive     IMIPENEM >=16 RESISTANT Resistant     PIP/TAZO >=128 RESISTANT Resistant     CEFEPIME 8 SENSITIVE Sensitive     * ABUNDANT PSEUDOMONAS AERUGINOSA   Results for orders placed or performed during the hospital encounter of 08/01/20 (from the past 24 hour(s))  Glucose, capillary     Status: Abnormal   Collection Time: 08/10/20  3:30 PM  Result Value Ref Range   Glucose-Capillary 141 (H) 70 - 99 mg/dL  Glucose, capillary     Status: Abnormal   Collection Time: 08/10/20  7:51 PM  Result Value Ref Range   Glucose-Capillary 130 (H) 70 - 99 mg/dL  Glucose, capillary     Status: Abnormal   Collection Time: 08/10/20  8:50 PM  Result Value Ref Range   Glucose-Capillary 116 (H) 70 - 99 mg/dL  Glucose, capillary     Status: Abnormal   Collection Time: 08/10/20 11:30 PM  Result Value Ref Range   Glucose-Capillary 127 (H) 70 - 99 mg/dL  Glucose, capillary     Status: Abnormal   Collection Time: 08/11/20  4:06 AM  Result Value Ref Range   Glucose-Capillary 140 (H) 70 - 99 mg/dL  Magnesium     Status: None   Collection Time: 08/11/20  5:18 AM  Result Value Ref Range    Magnesium 2.3 1.7 - 2.4 mg/dL  Phosphorus     Status: None   Collection Time: 08/11/20  5:18 AM  Result Value Ref Range   Phosphorus 3.5 2.5 - 4.6 mg/dL  Basic metabolic panel  Status: Abnormal   Collection Time: 08/11/20  5:18 AM  Result Value Ref Range   Sodium 138 135 - 145 mmol/L   Potassium 4.3 3.5 - 5.1 mmol/L   Chloride 103 98 - 111 mmol/L   CO2 27 22 - 32 mmol/L   Glucose, Bld 139 (H) 70 - 99 mg/dL   BUN 21 (H) 6 - 20 mg/dL   Creatinine, Ser <0.30 (L) 0.44 - 1.00 mg/dL   Calcium 8.8 (L) 8.9 - 10.3 mg/dL   GFR calc non Af Amer NOT CALCULATED >60 mL/min   GFR calc Af Amer NOT CALCULATED >60 mL/min   Anion gap 8 5 - 15  Glucose, capillary     Status: Abnormal   Collection Time: 08/11/20  6:57 AM  Result Value Ref Range   Glucose-Capillary 138 (H) 70 - 99 mg/dL  CBC     Status: Abnormal   Collection Time: 08/11/20  7:58 AM  Result Value Ref Range   WBC 14.4 (H) 4.0 - 10.5 K/uL   RBC 3.34 (L) 3.87 - 5.11 MIL/uL   Hemoglobin 10.8 (L) 12.0 - 15.0 g/dL   HCT 32.5 (L) 36 - 46 %   MCV 97.3 80.0 - 100.0 fL   MCH 32.3 26.0 - 34.0 pg   MCHC 33.2 30.0 - 36.0 g/dL   RDW 15.4 11.5 - 15.5 %   Platelets 328 150 - 400 K/uL   nRBC 0.0 0.0 - 0.2 %  Glucose, capillary     Status: Abnormal   Collection Time: 08/11/20 11:39 AM  Result Value Ref Range   Glucose-Capillary 151 (H) 70 - 99 mg/dL    Best Practice/Protocols:  VTE Prophylaxis: Lovenox (prophylaxtic dose) GI Prophylaxis: Antihistamine ICU hypo/hyperglycemia  Events:   Studies: DG Abd 1 View  Result Date: 08/10/2020 CLINICAL DATA:  Diverticulitis. EXAM: ABDOMEN - 1 VIEW COMPARISON:  CT 08/09/2020.  Abdomen series 08/06/2020. FINDINGS: Percutaneous gastrostomy tube noted in stable position. Air-filled loops of slightly prominent small bowel noted. Oral contrast noted in the colon. Findings suggest mild adynamic ileus. Free air present best demonstrated on prior CT. Spine scoliosis concave right. Diffuse degenerative  change lumbar spine. IMPRESSION: 1.  Percutaneous gastrostomy tube in stable position. 2. Air-filled loops of slightly prominent small bowel noted. Oral contrast noted in the colon. Findings suggest mild adynamic ileus. Free air present best demonstrated on prior CT. Electronically Signed   By: Marcello Moores  Register   On: 08/10/2020 12:44   CT ABDOMEN PELVIS W CONTRAST  Result Date: 08/09/2020 CLINICAL DATA:  Suspected diverticulitis, follow-up pneumoperitoneum EXAM: CT ABDOMEN AND PELVIS WITH CONTRAST TECHNIQUE: Multidetector CT imaging of the abdomen and pelvis was performed using the standard protocol following bolus administration of intravenous contrast. CONTRAST:  153m OMNIPAQUE IOHEXOL 300 MG/ML  SOLN COMPARISON:  08/05/2020 FINDINGS: Lower chest: Heterogeneous airspace opacity of the right lung base with a small associated right pleural effusion. Bilateral breast implants Hepatobiliary: No solid liver abnormality is seen. No gallstones, gallbladder wall thickening, or biliary dilatation. Pancreas: Unremarkable. No pancreatic ductal dilatation or surrounding inflammatory changes. Spleen: Normal in size without significant abnormality. Adrenals/Urinary Tract: Adrenal glands are unremarkable. Redemonstrated nonobstructive right nephrolithiasis. No hydronephrosis. Foley catheter in the urinary bladder. Stomach/Bowel: Redemonstrated percutaneous gastrostomy tube. Appendix appears normal. No evidence of bowel wall thickening, distention, or inflammatory changes. Severe pancolonic diverticulosis. Vascular/Lymphatic: No significant vascular findings are present. No enlarged abdominal or pelvic lymph nodes. Reproductive: No mass or other significant abnormality. Other: No abdominal wall hernia or abnormality. Small  volume pneumoperitoneum seen on prior examination is persistent although reduced in volume (series 2, image 46). Musculoskeletal: No acute or significant osseous findings. Profound sarcopenia throughout.  IMPRESSION: 1. Small volume pneumoperitoneum seen on prior examination is persistent although reduced in volume. As on prior examination, there is no definite etiology identified. Of note, mesenteric air loculations seen on prior examination are resolved. 2. Previously seen rectal wall thickening is resolved. 3. Heterogeneous airspace opacity of the right lung base with a small associated right pleural effusion, most consistent with infection or aspiration. 4. Severe pancolonic diverticulosis without evidence of acute diverticulitis. 5. Redemonstrated percutaneous gastrostomy tube. 6. Nonobstructive right nephrolithiasis. Electronically Signed   By: Eddie Candle M.D.   On: 08/09/2020 13:07   CT ABDOMEN PELVIS W CONTRAST  Result Date: 08/05/2020 CLINICAL DATA:  Abdominal abscess or infection suspected, history of diverticulitis, persistent leukocytosis EXAM: CT ABDOMEN AND PELVIS WITH CONTRAST TECHNIQUE: Multidetector CT imaging of the abdomen and pelvis was performed using the standard protocol following bolus administration of intravenous contrast. CONTRAST:  138m OMNIPAQUE IOHEXOL 300 MG/ML SOLN, additional oral enteric contrast COMPARISON:  08/01/2020 FINDINGS: Lower chest: Small right pleural effusion and associated atelectasis or consolidation. Bilateral breast implants. Hepatobiliary: No solid liver abnormality is seen. No gallstones, gallbladder wall thickening, or biliary dilatation. Pancreas: Unremarkable. No pancreatic ductal dilatation or surrounding inflammatory changes. Spleen: Normal in size without significant abnormality. Adrenals/Urinary Tract: Adrenal glands are unremarkable. Nonobstructive right nephrolithiasis. No hydronephrosis. Foley catheter in the urinary bladder, containing air within the bladder lumen. Bladder is unremarkable. Stomach/Bowel: Percutaneous gastrostomy. Appendix is not clearly visualized and may be surgically absent. Severe pancolonic diverticulosis. There is wall  thickening of the distal sigmoid colon and rectum with interval disimpaction of large stool balls previously seen in the rectum. Vascular/Lymphatic: Aortic atherosclerosis. No enlarged abdominal or pelvic lymph nodes. Reproductive: No mass or other significant abnormality. Other: No abdominal wall hernia or abnormality. There is small volume pneumoperitoneum in the ventral abdomen, with small loculations of air in the small bowel mesentery and sigmoid mesocolon (series 5, image 25, series 2, image 70). Musculoskeletal: No acute or significant osseous findings. Severe sarcopenia throughout the included musculature. IMPRESSION: 1. There is small volume pneumoperitoneum in the ventral abdomen, with small loculations of air in the small bowel mesentery and sigmoid mesocolon. Findings are consistent with bowel perforation although of uncertain etiology, possibly related to mechanical disimpaction or perforated stercoral ulceration given previously noted large stool balls in the rectum. 2. There is wall thickening of the distal sigmoid colon and rectum suggestive of nonspecific infectious or inflammatory proctocolitis. 3. Severe pancolonic diverticulosis without evidence of acute diverticulitis. 4. Small right pleural effusion and associated atelectasis or consolidation. 5. Nonobstructive right nephrolithiasis. 6. Severe sarcopenia. 7. Aortic Atherosclerosis (ICD10-I70.0). These results will be called to the ordering clinician or representative by the Radiologist Assistant, and communication documented in the PACS or [employer] Electronically Signed   By: AEddie CandleM.D.   On: 08/05/2020 11:35   CT ABDOMEN PELVIS W CONTRAST  Result Date: 08/01/2020 CLINICAL DATA:  Abdominal distension, lower abdominal pain EXAM: CT ABDOMEN AND PELVIS WITH CONTRAST TECHNIQUE: Multidetector CT imaging of the abdomen and pelvis was performed using the standard protocol following bolus administration of intravenous contrast.  CONTRAST:  1042mOMNIPAQUE IOHEXOL 300 MG/ML  SOLN COMPARISON:  May 01, 2013 FINDINGS: Lower chest: Basilar atelectasis. No consolidation. No pleural effusion. Hepatobiliary: Liver without focal, suspicious lesion. Portal vein is patent. No pericholecystic stranding. No biliary duct dilation.  Pancreas: Pancreas is normal without ductal dilation or inflammation. Spleen: Spleen normal in size and contour. Adrenals/Urinary Tract: Adrenal glands are normal. No signs of hydronephrosis. Nephrolithiasis. Large calculus in the RIGHT upper pole measuring approximately 1.6 x 1.0 cm. Smaller calculi in the RIGHT renal pelvis and a smaller interpolar calculus in an interpolar calyx measuring approximately 7 mm. Normal appearance of the LEFT kidney Stomach/Bowel: Signs of acute diverticulitis along the descending sigmoid junction. Rectal distension up to 8 cm.  Pancolonic diverticulosis. Gastric tube in situ similar to the prior study. No acute small bowel process. Vascular/Lymphatic: Scattered atheromatous plaque of the abdominal aorta tracking in the iliac vessels. No adenopathy in the retroperitoneum. No adenopathy in the pelvis. Reproductive: No adnexal mass. Other: No free air.  No abscess. Musculoskeletal: Thinning of the sacrum and marked osteopenia with loss of muscular density, replacement with fatty atrophy. Bilateral breast implants. No acute musculoskeletal process. IMPRESSION: 1. Signs of acute diverticulitis along the descending sigmoid junction in the setting of pancolonic diverticulosis. No abscess or free air. 2. Fecal impaction 3. RIGHT-sided nephrolithiasis with large calculi as described. 4. Muscular atrophy and osteopenia likely related to primary diagnosis of ALS. Thinning of the sacrococcygeal region with respect of bone may relate to prior pressure ulceration. There is no stranding in the fat to suggest decubitus changes at this time in this location. Mild stranding is noted overlying the RIGHT ischium.  5. Aortic atherosclerosis. Aortic Atherosclerosis (ICD10-I70.0). Electronically Signed   By: Zetta Bills M.D.   On: 08/01/2020 15:21   DG Chest Port 1 View  Result Date: 08/04/2020 CLINICAL DATA:  Leukocytosis. EXAM: PORTABLE CHEST 1 VIEW COMPARISON:  Chest radiograph 08/31/2016. Lung bases from abdominal CT 08/01/2020 FINDINGS: Tracheostomy tube tip at the thoracic inlet. Right upper extremity PICC tip in the lower SVC. Chronic elevation of right hemidiaphragm. Adjacent compressive atelectasis/scarring at the right lung base. There is mild biapical pleuroparenchymal scarring. No acute or confluent airspace disease. No pleural effusion or pneumothorax. No acute osseous abnormalities are seen. IMPRESSION: 1. Chronic elevation of right hemidiaphragm with chronic atelectasis or scarring at the right lung base. 2. Tracheostomy tube tip at the thoracic inlet. 3. Right upper extremity PICC tip in the lower SVC. Electronically Signed   By: Keith Rake M.D.   On: 08/04/2020 17:28   DG Abd Portable 2V  Result Date: 08/06/2020 CLINICAL DATA:  Diverticulitis.  Follow-up. EXAM: PORTABLE ABDOMEN - 2 VIEW COMPARISON:  CT abdomen done yesterday. FINDINGS: Previously administered contrast now present throughout the colon. Gastrostomy tube in place. No sign of bowel obstruction. No visible free air. Curvature and chronic degenerative changes of the spine. IMPRESSION: Previously administered contrast now present throughout the colon. No sign of bowel obstruction or free air. Electronically Signed   By: Nelson Chimes M.D.   On: 08/06/2020 11:48   [place] EKG SITE RITE  Result Date: 08/02/2020 If [facility] image not attached, placement could not be confirmed due to current cardiac rhythm.   Consults: Treatment Team:  Jules Husbands, MD Abbie Sons, MD   Subjective:    Overnight Issues: Overnight no issues.  Remains on home vent without distress.Tearful today.Some abdominal discomfort but no frank  pain.  Objective:  Vital signs for last 24 hours: Temp:  [98.2 F (36.8 C)-98.9 F (37.2 C)] 98.2 F (36.8 C) (09/01 0900) Pulse Rate:  [82-121] 91 (09/01 1100) Resp:  [12-24] 19 (09/01 1100) BP: (81-113)/(50-67) 93/55 (09/01 1100) SpO2:  [93 %-99 %] 93 % (  09/01 1100) FiO2 (%):  [28 %] 28 % (09/01 0900) Weight:  [75.2 kg] 75.2 kg (09/01 0500)  Hemodynamic parameters for last 24 hours:    Intake/Output from previous day: 08/31 0701 - 09/01 0700 In: 1787.6 [I.V.:1304; IV Piggyback:483.6] Out: 1500 [Urine:1500]  Intake/Output this shift: No intake/output data recorded.  Vent settings for last 24 hours: Vent Mode: AC FiO2 (%):  [28 %] 28 % Set Rate:  [12 bmp] 12 bmp Vt Set:  [450 mL] 450 mL PEEP:  [5 cmH20] 5 cmH20  Physical Exam:  GENERAL: Chronically ill appearing female, laying in bed, on chronic vent via Trach, in NAD. Temporal wasting. NECK: Neck supple, Tracheostomy in place  PULMONARY: Clear to auscultation bilaterally, no wheezing or rales, symmetrical CARDIOVASCULAR:Regular rate & rhythm, s1s2, no M/R/G  GASTROINTESTINAL: Soft,distended,+tenderness to LLQ, no guarding or rebound tenderness, BS+ x4  MUSCULOSKELETAL: No swelling, no clubbing, no edema. Diffuse muscle wasting. 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  No obvious rashes, lesions, or ulcerations   Assessment/Plan:  Acute SEPSIS due to acute DIVERTICULITIS Contained pneumoperitoneum Pseudomonas aeruginosa pneumonia  -Monitor fever curve  -Trend WBC 's, on upward trend -Pseudomonas aeruginosa is multidrug-resistant -Check with microbiology lab sensitivities updated -Patient will be placed on cefepime for pneumonia coverage -Flagyl for diverticulitis -Add antifungal due to contain pneumoperitoneum/perforation -General Surgery following, appreciate input ~ No need for surgical intervention at this time per Surgery -Pain control -PRN Antiemetics -Follow abdominal exam -Follow-up  with general surgery with regards to the above -Patient's mother wants her to come home -Determining home antibiotic regimen   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  Discussed with patient's mother, Early Osmond, at length via telephone conversation today,she was updated in full.  Critical Care Total Time*: 33 Minutes   Ottie Glazier, M.D.  [facility] [place]   08/11/2020  *Care during the described time interval was provided by me and/or other providers on the critical care team.  I have reviewed this patient's available data, including medical history, events of note, physical examination and test results as part of my evaluation.  **This note was dictated using voice recognition software/Dragon.  Despite best efforts to proofread, errors can occur which can change the meaning.  Any change was purely unintentional.

## 2020-08-11 NOTE — Progress Notes (Signed)
[facility] SURGICAL PROGRESS NOTE (cpt [phone])  Hospital Day(s): 10.   Interval History:  Patient seen and examined no acute events or new complaints overnight.  Patient resting comfortably, does shake her head "no" when asked about abdominal pain CBC is pending Renal function remains normal with sCr - <0.30, good UO - 1.8L No electrolyte derangement Repeat CT Abdomen/Pelvis yesterday (08/30) was improved She was re-started on tube feedings on 08/31, advancing as tolerates   Review of Systems:  Unable to reliably preform secondary to non-verbal status  Vital signs in last 24 hours: [min-max] current  Temp:  [98.3 F (36.8 C)-98.9 F (37.2 C)] 98.4 F (36.9 C) (09/01 0400) Pulse Rate:  [72-110] 103 (09/01 0500) Resp:  [12-24] 16 (09/01 0500) BP: (81-113)/(50-67) 100/60 (09/01 0500) SpO2:  [94 %-99 %] 94 % (09/01 0500) FiO2 (%):  [28 %] 28 % (09/01 0328) Weight:  [75.2 kg] 75.2 kg (09/01 0500)     Height: 5' 5.98" (167.6 cm) Weight: 75.2 kg BMI (Calculated): 26.77   Intake/Output last 2 shifts:  08/31 0701 - 09/01 0700 In: 1787.6 [I.V.:1304; IV Piggyback:483.6] Out: 1500 [Urine:1500]   Physical Exam:  Constitutional:Somnolent,arouses, shakes head to answer very simple questions HENT: normocephalic without obvious abnormality, tracheostomy present Respiratory:On ventilator Cardiovascular: mildly tachycardic to 106and sinus rhythm  Gastrointestinal: soft,very mild LLQ soreness if any, no evidence of peritonitis although this is very limited given her lack of participation in examination and 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 Genitourinary: Foley in palce   Labs:  CBC Latest Ref Rng & Units 08/10/2020 08/09/2020 08/07/2020  WBC 4.0 - 10.5 K/uL 15.3(H) 12.6(H) 8.2  Hemoglobin 12.0 - 15.0 g/dL 11.2(L) 11.7(L) 11.9(L)  Hematocrit 36 - 46 % 34.0(L) 35.9(L) 35.9(L)  Platelets 150 - 400 K/uL 285 235 251   CMP Latest Ref Rng & Units 08/11/2020 08/10/2020 08/09/2020   Glucose 70 - 99 mg/dL 413(K) 440(N) 027(O)  BUN 6 - 20 mg/dL 53(G) 64(Q) 19  Creatinine 0.44 - 1.00 mg/dL <0.34(V) <4.25(Z) <5.63(O)  Sodium 135 - 145 mmol/L 138 138 139  Potassium 3.5 - 5.1 mmol/L 4.3 4.4 4.5  Chloride 98 - 111 mmol/L 103 104 105  CO2 22 - 32 mmol/L 27 29 28   Calcium 8.9 - 10.3 mg/dL ) 7.5(I) 4.3(P)  Total Protein 6.5 - 8.1 g/dL - - 6.4(L)  Total Bilirubin 0.3 - 1.2 mg/dL - - 0.3  Alkaline Phos 38 - 126 U/L - - 76  AST 15 - 41 U/L - - 40  ALT 0 - 44 U/L - - 36     Imaging studies: No new pertinent imaging studies   Assessment/Plan: (ICD-10's: K37.92) 49 y.o. female with acute, initially uncomplicated, diverticulitisfound to have increase in pneumoperitoneum on CT A/P (08/26) which is improved on repeat imaging (08/30), and she is otherwise clinically improving and without overt evidence of peritonitis or sepsis   - Continue tube feedings; advance to goal   - Continue IV Abx (Zosyn); recommend completing 14 days minimum, can be transitioned to PO Augmentin for home - Monitor leukocytosis, electrolytes - Monitor abdominal examination - Pain control prn; antiemetics prn - No emergent surgical intervention - Appreciatepalliative consultation; full scope   - Further management per primary team   - General surgery will sign off and follow peripherally for now, ensure leukocytosis comes down, continue ABx as described above, please call with questions/concerns    All of the above findings and recommendations were discussed with the medical team.  --  Lynden Oxford, PA-C [facility] 08/11/2020, 7:37 AM [phone] M-F: 7am - 4pm

## 2020-08-12 LAB — GLUCOSE, CAPILLARY
Glucose-Capillary: 118 mg/dL — ABNORMAL HIGH (ref 70–99)
Glucose-Capillary: 125 mg/dL — ABNORMAL HIGH (ref 70–99)
Glucose-Capillary: 127 mg/dL — ABNORMAL HIGH (ref 70–99)
Glucose-Capillary: 128 mg/dL — ABNORMAL HIGH (ref 70–99)

## 2020-08-12 LAB — BASIC METABOLIC PANEL
Anion gap: 8 (ref 5–15)
BUN: 16 mg/dL (ref 6–20)
CO2: 28 mmol/L (ref 22–32)
Calcium: 8.7 mg/dL — ABNORMAL LOW (ref 8.9–10.3)
Chloride: 102 mmol/L (ref 98–111)
Creatinine, Ser: <0.3 mg/dL — ABNORMAL LOW (ref 0.44–1.00)
Glucose, Bld: 127 mg/dL — ABNORMAL HIGH (ref 70–99)
Potassium: 4 mmol/L (ref 3.5–5.1)
Sodium: 138 mmol/L (ref 135–145)

## 2020-08-12 LAB — CBC
HCT: 33.4 % — ABNORMAL LOW (ref 36.0–46.0)
Hemoglobin: 10.6 g/dL — ABNORMAL LOW (ref 12.0–15.0)
MCH: 31.5 pg (ref 26.0–34.0)
MCHC: 31.7 g/dL (ref 30.0–36.0)
MCV: 99.4 fL (ref 80.0–100.0)
Platelets: 328 10*3/uL (ref 150–400)
RBC: 3.36 MIL/uL — ABNORMAL LOW (ref 3.87–5.11)
RDW: 14.9 % (ref 11.5–15.5)
WBC: 14.3 10*3/uL — ABNORMAL HIGH (ref 4.0–10.5)
nRBC: 0 % (ref 0.0–0.2)

## 2020-08-12 LAB — PHOSPHORUS: Phosphorus: 3.5 mg/dL (ref 2.5–4.6)

## 2020-08-12 LAB — MAGNESIUM: Magnesium: 2.4 mg/dL (ref 1.7–2.4)

## 2020-08-12 LAB — PROCALCITONIN: Procalcitonin: <0.1 ng/mL

## 2020-08-12 NOTE — TOC Progression Note (Signed)
Transition of Care [facility]) - Progression Note    Patient Details  Name: Brenda Anderson MRN: [id number] Date of Birth: 1970-12-13  Transition [facility] CM/SW Contact  Marina Goodell Phone Number: [phone] 08/12/2020, 2:34 PM  Clinical Narrative:     CSW spoke with patient mother Ms. Brenda Anderson, with an update on patient disposition. Ms. Brenda Anderson stated she was looking forward to her daughter returning home and that Dr. Karna Christmas updated her on the conversation about goals he had with the patient.  The CSW stated I would contact Ron RN, from [place] to update him on patient disposition.  This CSW spoke with Ms. Brenda Anderson about transportation time-line for d/c and she verbalized understanding.  This CSW contacted Ron RN, from [facility] to update on patient disposition.  [employer] fundraiser stated they will need an order with this exact phrasing, "resume private duty nursing", there will also need another order placed for changes in the patient's tube feeding regiment.  CSW updated Attending on order requests.       Expected Discharge Plan and Services                                                 Social Determinants of Health (SDOH) Interventions    Readmission Risk Interventions No flowsheet data found.

## 2020-08-12 NOTE — TOC Progression Note (Signed)
Transition of Care [facility]) - Progression Note    Patient Details  Name: Brenda Anderson MRN: [id number] Date of Birth: [birth year]  Transition of Care [facility]) CM/SW Contact  Marina Goodell Phone Number: [phone] 08/12/2020, 5:08 PM  Clinical Narrative:     CSW faxed home health order information to Ron RN at [place] [phone] (fax).  CSW called Zach at [facility] to update on patient disposition and changes in feeding regiment.       Expected Discharge Plan and Services                                                 Social Determinants of Health (SDOH) Interventions    Readmission Risk Interventions No flowsheet data found.

## 2020-08-12 NOTE — Progress Notes (Signed)
Pt resting in bed quietly. Pt made comfort care today. Dr. Karna Christmas wants to keep home vent in place until pt arrives home. Family aware and have visited today. Plan is for pt to discharge home tomorrow.

## 2020-08-12 NOTE — Consult Note (Signed)
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 Tube feeds were resumed and TPN was stopped on 08/11/20  Goal of Therapy:  Electrolytes WNL  Plan:   No electrolyte replacement warranted for today  Re-check electrolytes in am 9/3  Lowella Bandy ,PharmD Clinical Pharmacist 08/12/2020 9:09 AM

## 2020-08-12 NOTE — Progress Notes (Signed)
/Follow up - Critical Care Medicine Note / Patient Details:    Brenda Anderson is an 49 y.o. 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  Patient presented with sepsis noted to have diverticulitis with contained pneumoperitoneum.  08/11/20- no events today, discussed with TCC regarding LTAC. Plan to potentially d/c home. Mother of patient does not wish for LTAC. Patient had large loose stool bm today.  08/12/20-I had a lengthy goals of care discussion with patient at bedside today.  We reviewed her ongoing progressive chronic lung disease with Leverne Humbles as well as multiple comorbid conditions with concomitant recent complications including perforated viscus with pneumoperitoneum from diverticulitis.  Patient does report worsening clinical deterioration over some time now and after further discourse regarding her goals, she feels as if the medical therapy is at this point futile.  She does wish to go home and we discussed options including de-escalation of therapy and to focus more on comfort.  Patient does feel that at this time he would be more appropriate to provide her with pain medication as well as any supportive care including nourishment on comfort measures and to send her home.  I have also communicated all this with her mother who agrees that this is appropriate and that we should respect patient's wishes.  This has been discussed with social worker Adelene Amas will be in contact with mother as well as [facility] to accomodate patient. Goal status is DNR comfort care.     Lines, Airways, Drains: PICC Double Lumen 08/02/20 PICC Right Brachial 39 cm 1 cm (Active)  Indication for Insertion or Continuance of Line Prolonged intravenous therapies 08/09/20 1200  Exposed Catheter (cm) 1 cm 08/07/20 0900  Site Assessment Clean;Dry;Intact 08/09/20 1200  Lumen #1 Status Infusing 08/09/20 1200  Lumen #2 Status Infusing 08/09/20 1200  Dressing Type Transparent;Occlusive 08/09/20 1200   Dressing Status Clean;Dry;Intact;Antimicrobial disc in place 08/09/20 1200  Safety Lock Not Applicable 83/15/17 6160  Line Care Connections checked and tightened 08/09/20 0900  Line Adjustment (NICU/IV Team Only) No 08/06/20 2000  Dressing Intervention New dressing 08/02/20 1600  Dressing Change Due 08/09/20 08/07/20 1930     Gastrostomy/Enterostomy Percutaneous endoscopic gastrostomy (PEG) RUQ (Active)  Surrounding Skin Dry;Intact 08/09/20 1217  Tube Status Clamped 08/09/20 1217  Drainage Appearance Bile;Green 08/08/20 2000  Dressing Status Clean;Dry;Intact 08/09/20 1217  Dressing Intervention New dressing 08/08/20 0330  Dressing Type Split gauze 08/09/20 1217  Dressing Change Due 08/08/20 08/07/20 1930  Output (mL) 0 mL 08/09/20 1217     Urethral Catheter Urologist Coude;Non-latex (Active)  Indication for Insertion or Continuance of Catheter Chronic catheter use 08/09/20 0749  Site Assessment Clean;Intact 08/09/20 1217  Catheter Maintenance Bag below level of bladder;Catheter secured;No dependent loops;Drainage bag/tubing not touching floor 08/09/20 1217  Collection Container Standard drainage bag 08/09/20 1217  Securement Method Securing device (Describe) 08/09/20 1217  Urinary Catheter Interventions (if applicable) Unclamped 73/71/06 0751  Output (mL) 150 mL 08/09/20 1217    Anti-infectives:  Anti-infectives (From admission, onward)   Start     Dose/Rate Route Frequency Ordered Stop   08/11/20 1200  anidulafungin (ERAXIS) 100 mg in sodium chloride 0.9 % 100 mL IVPB        100 mg 78 mL/hr over 100 Minutes Intravenous Every 24 hours 08/10/20 0958     08/10/20 2200  metroNIDAZOLE (FLAGYL) IVPB 500 mg        500 mg 100 mL/hr over 60 Minutes Intravenous Every 8 hours 08/10/20 1325  08/10/20 1400  ceFEPIme (MAXIPIME) 2 g in sodium chloride 0.9 % 100 mL IVPB        2 g 200 mL/hr over 30 Minutes Intravenous Every 8 hours 08/10/20 1135     08/10/20 1200  anidulafungin (ERAXIS)  200 mg in sodium chloride 0.9 % 200 mL IVPB        200 mg 78 mL/hr over 200 Minutes Intravenous  Once 08/10/20 0958 08/10/20 1458   08/02/20 0000  piperacillin-tazobactam (ZOSYN) IVPB 3.375 g  Status:  Discontinued        3.375 g 12.5 mL/hr over 240 Minutes Intravenous Every 8 hours 08/01/20 2210 08/10/20 1135   08/01/20 1530  piperacillin-tazobactam (ZOSYN) IVPB 3.375 g        3.375 g 100 mL/hr over 30 Minutes Intravenous  Once 08/01/20 1524 08/01/20 1653     Scheduled Meds: . baclofen  10 mg Per Tube TID  . buPROPion  100 mg Per Tube TID  . chlorhexidine gluconate (MEDLINE KIT)  15 mL Mouth Rinse BID  . Chlorhexidine Gluconate Cloth  6 each Topical Daily  . citalopram  20 mg Per Tube Daily  . famotidine  20 mg Per Tube BID  . feeding supplement (OSMOLITE 1.5 CAL)  1,000 mL Per Tube Q24H  . feeding supplement (PROSource TF)  45 mL Per Tube Daily  . fluticasone  1 spray Each Nare Daily  . mouth rinse  15 mL Mouth Rinse 10 times per day  . melatonin  7.5 mg Per Tube QHS  . midodrine  5 mg Oral TID WC  . montelukast  10 mg Per Tube QHS  . pregabalin  100 mg Per Tube BID  . sodium chloride flush  10-40 mL Intracatheter Q12H   Continuous Infusions: . anidulafungin 78 mL/hr at 08/12/20 1151  . ceFEPime (MAXIPIME) IV Stopped (08/12/20 0655)  . metronidazole Stopped (08/12/20 6644)   PRN Meds:.HYDROcodone-acetaminophen, morphine injection, naphazoline-glycerin, ondansetron (ZOFRAN) IV, polyethylene glycol, pregabalin, promethazine, sodium chloride flush, sodium phosphate   Microbiology: Results for orders placed or performed during the hospital encounter of 08/01/20  SARS Coronavirus 2 by RT PCR (hospital order, performed in [facility] hospital lab) Nasopharyngeal Nasopharyngeal Swab     Status: None   Collection Time: 08/01/20  3:47 PM   Specimen: Nasopharyngeal Swab  Result Value Ref Range Status   SARS Coronavirus 2 NEGATIVE NEGATIVE Final    Comment: (NOTE) SARS-CoV-2 target  nucleic acids are NOT DETECTED.  The SARS-CoV-2 RNA is generally detectable in upper and lower respiratory specimens during the acute phase of infection. The lowest concentration of SARS-CoV-2 viral copies this assay can detect is 250 copies / mL. A negative result does not preclude SARS-CoV-2 infection and should not be used as the sole basis for treatment or other patient management decisions.  A negative result may occur with improper specimen collection / handling, submission of specimen other than nasopharyngeal swab, presence of viral mutation(s) within the areas targeted by this assay, and inadequate number of viral copies (<250 copies / mL). A negative result must be combined with clinical observations, patient history, and epidemiological information.  Fact Sheet for Patients:   StrictlyIdeas.no  Fact Sheet for Healthcare Providers: BankingDealers.co.za  This test is not yet approved or  cleared by the [place] FDA and has been authorized for detection and/or diagnosis of SARS-CoV-2 by FDA under an Emergency Use Authorization (EUA).  This EUA will remain in effect (meaning this test can be used) for the duration of  the COVID-19 declaration under Section 564(b)(1) of the Act, 21 U.S.C. section 360bbb-3(b)(1), unless the authorization is terminated or revoked sooner.  Performed at [facility], [address] [place], [place] [address]   CULTURE, BLOOD (ROUTINE X 2) w Reflex to ID Panel     Status: None   Collection Time: 08/01/20  5:42 PM   Specimen: BLOOD  Result Value Ref Range Status   Specimen Description BLOOD BLOOD LEFT HAND  Final   Special Requests   Final    BOTTLES DRAWN AEROBIC AND ANAEROBIC Blood Culture adequate volume   Culture   Final    NO GROWTH 5 DAYS Performed at [facility], [address] [place], [place] [address]    Report Status 08/06/2020 FINAL  Final  MRSA PCR  Screening     Status: None   Collection Time: 08/01/20  5:55 PM   Specimen: Nasopharyngeal  Result Value Ref Range Status   MRSA by PCR NEGATIVE NEGATIVE Final    Comment:        The GeneXpert MRSA Assay (FDA approved for NASAL specimens only), is one component of a comprehensive MRSA colonization surveillance program. It is not intended to diagnose MRSA infection nor to guide or monitor treatment for MRSA infections. Performed at [facility] [address] [place], [place] [address]   CULTURE, BLOOD (ROUTINE X 2) w Reflex to ID Panel     Status: None   Collection Time: 08/01/20  6:04 PM   Specimen: BLOOD  Result Value Ref Range Status   Specimen Description BLOOD BLOOD RIGHT HAND  Final   Special Requests   Final    BOTTLES DRAWN AEROBIC AND ANAEROBIC Blood Culture adequate volume   Culture   Final    NO GROWTH 5 DAYS Performed at [facility], [address] [place], [place] [address]    Report Status 08/06/2020 FINAL  Final  Culture, respiratory (non-expectorated)     Status: None   Collection Time: 08/06/20 12:29 PM   Specimen: Tracheal Aspirate; Respiratory  Result Value Ref Range Status   Specimen Description   Final    TRACHEAL ASPIRATE Performed at [facility], [address] [place], [place] [address]    Special Requests   Final    NONE Performed at [facility], [address] [place], [place] [address]    Gram Stain   Final    FEW WBC PRESENT, PREDOMINANTLY PMN MODERATE GRAM VARIABLE ROD Performed at [facility], [address], [place],  [address]    Culture ABUNDANT PSEUDOMONAS AERUGINOSA  Final   Report Status 08/10/2020 FINAL  Final   Organism ID, Bacteria PSEUDOMONAS AERUGINOSA  Final      Susceptibility   Pseudomonas aeruginosa - MIC*    CEFTAZIDIME 4 SENSITIVE Sensitive     CIPROFLOXACIN >=4 RESISTANT Resistant     GENTAMICIN <=1 SENSITIVE Sensitive     IMIPENEM >=16 RESISTANT  Resistant     PIP/TAZO >=128 RESISTANT Resistant     CEFEPIME 8 SENSITIVE Sensitive     * ABUNDANT PSEUDOMONAS AERUGINOSA   Results for orders placed or performed during the hospital encounter of 08/01/20 (from the past 24 hour(s))  Glucose, capillary     Status: Abnormal   Collection Time: 08/11/20  3:59 PM  Result Value Ref Range   Glucose-Capillary 124 (H) 70 - 99 mg/dL  Glucose, capillary     Status: Abnormal   Collection Time: 08/11/20  8:11 PM  Result Value Ref Range   Glucose-Capillary 110 (H) 70 - 99 mg/dL  Glucose, capillary     Status: Abnormal   Collection Time: 08/12/20 12:25 AM  Result Value Ref Range   Glucose-Capillary 118 (H) 70 - 99 mg/dL  Magnesium     Status: None   Collection Time: 08/12/20  4:41 AM  Result Value Ref Range   Magnesium 2.4 1.7 - 2.4 mg/dL  Phosphorus     Status: None   Collection Time: 08/12/20  4:41 AM  Result Value Ref Range   Phosphorus 3.5 2.5 - 4.6 mg/dL  Basic metabolic panel     Status: Abnormal   Collection Time: 08/12/20  4:41 AM  Result Value Ref Range   Sodium 138 135 - 145 mmol/L   Potassium 4.0 3.5 - 5.1 mmol/L   Chloride 102 98 - 111 mmol/L   CO2 28 22 - 32 mmol/L   Glucose, Bld 127 (H) 70 - 99 mg/dL   BUN 16 6 - 20 mg/dL   Creatinine, Ser <0.30 (L) 0.44 - 1.00 mg/dL   Calcium 8.7 (L) 8.9 - 10.3 mg/dL   GFR calc non Af Amer NOT CALCULATED >60 mL/min   GFR calc Af Amer NOT CALCULATED >60 mL/min   Anion gap 8 5 - 15  CBC     Status: Abnormal   Collection Time: 08/12/20  4:41 AM  Result Value Ref Range   WBC 14.3 (H) 4.0 - 10.5 K/uL   RBC 3.36 (L) 3.87 - 5.11 MIL/uL   Hemoglobin 10.6 (L) 12.0 - 15.0 g/dL   HCT 33.4 (L) 36 - 46 %   MCV 99.4 80.0 - 100.0 fL   MCH 31.5 26.0 - 34.0 pg   MCHC 31.7 30.0 - 36.0 g/dL   RDW 14.9 11.5 - 15.5 %   Platelets 328 150 - 400 K/uL   nRBC 0.0 0.0 - 0.2 %  Glucose, capillary     Status: Abnormal   Collection Time: 08/12/20  4:42 AM  Result Value Ref Range   Glucose-Capillary 127 (H)  70 - 99 mg/dL   Comment 1 Notify RN    Comment 2 Document in Chart   Glucose, capillary     Status: Abnormal   Collection Time: 08/12/20  7:01 AM  Result Value Ref Range   Glucose-Capillary 128 (H) 70 - 99 mg/dL  Glucose, capillary     Status: Abnormal   Collection Time: 08/12/20 11:17 AM  Result Value Ref Range   Glucose-Capillary 125 (H) 70 - 99 mg/dL    Best Practice/Protocols:  VTE Prophylaxis: Lovenox (prophylaxtic dose) GI Prophylaxis: Antihistamine ICU hypo/hyperglycemia  Events:   Studies: DG Abd 1 View  Result Date: 08/10/2020 CLINICAL DATA:  Diverticulitis. EXAM: ABDOMEN - 1 VIEW COMPARISON:  CT 08/09/2020.  Abdomen series 08/06/2020. FINDINGS: Percutaneous gastrostomy tube noted in stable position. Air-filled loops of slightly prominent small bowel noted. Oral contrast noted in the colon. Findings suggest mild adynamic ileus. Free air present best demonstrated on prior CT. Spine scoliosis concave right. Diffuse degenerative change lumbar spine. IMPRESSION: 1.  Percutaneous gastrostomy tube in stable position. 2. Air-filled loops of slightly prominent small bowel noted. Oral contrast noted in the colon. Findings suggest mild adynamic ileus. Free air present best demonstrated on prior CT. Electronically Signed   By: Marcello Moores  Register   On: 08/10/2020 12:44   CT ABDOMEN PELVIS W CONTRAST  Result Date: 08/09/2020 CLINICAL DATA:  Suspected diverticulitis, follow-up pneumoperitoneum EXAM: CT ABDOMEN AND PELVIS WITH CONTRAST  TECHNIQUE: Multidetector CT imaging of the abdomen and pelvis was performed using the standard protocol following bolus administration of intravenous contrast. CONTRAST:  18m OMNIPAQUE IOHEXOL 300 MG/ML  SOLN COMPARISON:  08/05/2020 FINDINGS: Lower chest: Heterogeneous airspace opacity of the right lung base with a small associated right pleural effusion. Bilateral breast implants Hepatobiliary: No solid liver abnormality is seen. No gallstones, gallbladder wall  thickening, or biliary dilatation. Pancreas: Unremarkable. No pancreatic ductal dilatation or surrounding inflammatory changes. Spleen: Normal in size without significant abnormality. Adrenals/Urinary Tract: Adrenal glands are unremarkable. Redemonstrated nonobstructive right nephrolithiasis. No hydronephrosis. Foley catheter in the urinary bladder. Stomach/Bowel: Redemonstrated percutaneous gastrostomy tube. Appendix appears normal. No evidence of bowel wall thickening, distention, or inflammatory changes. Severe pancolonic diverticulosis. Vascular/Lymphatic: No significant vascular findings are present. No enlarged abdominal or pelvic lymph nodes. Reproductive: No mass or other significant abnormality. Other: No abdominal wall hernia or abnormality. Small volume pneumoperitoneum seen on prior examination is persistent although reduced in volume (series 2, image 46). Musculoskeletal: No acute or significant osseous findings. Profound sarcopenia throughout. IMPRESSION: 1. Small volume pneumoperitoneum seen on prior examination is persistent although reduced in volume. As on prior examination, there is no definite etiology identified. Of note, mesenteric air loculations seen on prior examination are resolved. 2. Previously seen rectal wall thickening is resolved. 3. Heterogeneous airspace opacity of the right lung base with a small associated right pleural effusion, most consistent with infection or aspiration. 4. Severe pancolonic diverticulosis without evidence of acute diverticulitis. 5. Redemonstrated percutaneous gastrostomy tube. 6. Nonobstructive right nephrolithiasis. Electronically Signed   By: AEddie CandleM.D.   On: 08/09/2020 13:07   CT ABDOMEN PELVIS W CONTRAST  Result Date: 08/05/2020 CLINICAL DATA:  Abdominal abscess or infection suspected, history of diverticulitis, persistent leukocytosis EXAM: CT ABDOMEN AND PELVIS WITH CONTRAST TECHNIQUE: Multidetector CT imaging of the abdomen and pelvis was  performed using the standard protocol following bolus administration of intravenous contrast. CONTRAST:  1029mOMNIPAQUE IOHEXOL 300 MG/ML SOLN, additional oral enteric contrast COMPARISON:  08/01/2020 FINDINGS: Lower chest: Small right pleural effusion and associated atelectasis or consolidation. Bilateral breast implants. Hepatobiliary: No solid liver abnormality is seen. No gallstones, gallbladder wall thickening, or biliary dilatation. Pancreas: Unremarkable. No pancreatic ductal dilatation or surrounding inflammatory changes. Spleen: Normal in size without significant abnormality. Adrenals/Urinary Tract: Adrenal glands are unremarkable. Nonobstructive right nephrolithiasis. No hydronephrosis. Foley catheter in the urinary bladder, containing air within the bladder lumen. Bladder is unremarkable. Stomach/Bowel: Percutaneous gastrostomy. Appendix is not clearly visualized and may be surgically absent. Severe pancolonic diverticulosis. There is wall thickening of the distal sigmoid colon and rectum with interval disimpaction of large stool balls previously seen in the rectum. Vascular/Lymphatic: Aortic atherosclerosis. No enlarged abdominal or pelvic lymph nodes. Reproductive: No mass or other significant abnormality. Other: No abdominal wall hernia or abnormality. There is small volume pneumoperitoneum in the ventral abdomen, with small loculations of air in the small bowel mesentery and sigmoid mesocolon (series 5, image 25, series 2, image 70). Musculoskeletal: No acute or significant osseous findings. Severe sarcopenia throughout the included musculature. IMPRESSION: 1. There is small volume pneumoperitoneum in the ventral abdomen, with small loculations of air in the small bowel mesentery and sigmoid mesocolon. Findings are consistent with bowel perforation although of uncertain etiology, possibly related to mechanical disimpaction or perforated stercoral ulceration given previously noted large stool balls in  the rectum. 2. There is wall thickening of the distal sigmoid colon and rectum suggestive of nonspecific infectious or inflammatory proctocolitis. 3. Severe  pancolonic diverticulosis without evidence of acute diverticulitis. 4. Small right pleural effusion and associated atelectasis or consolidation. 5. Nonobstructive right nephrolithiasis. 6. Severe sarcopenia. 7. Aortic Atherosclerosis (ICD10-I70.0). These results will be called to the ordering clinician or representative by the Radiologist Assistant, and communication documented in the PACS or [employer]. Electronically Signed   By: Eddie Candle M.D.   On: 08/05/2020 11:35   CT ABDOMEN PELVIS W CONTRAST  Result Date: 08/01/2020 CLINICAL DATA:  Abdominal distension, lower abdominal pain EXAM: CT ABDOMEN AND PELVIS WITH CONTRAST TECHNIQUE: Multidetector CT imaging of the abdomen and pelvis was performed using the standard protocol following bolus administration of intravenous contrast. CONTRAST:  134m OMNIPAQUE IOHEXOL 300 MG/ML  SOLN COMPARISON:  May 01, 2013 FINDINGS: Lower chest: Basilar atelectasis. No consolidation. No pleural effusion. Hepatobiliary: Liver without focal, suspicious lesion. Portal vein is patent. No pericholecystic stranding. No biliary duct dilation. Pancreas: Pancreas is normal without ductal dilation or inflammation. Spleen: Spleen normal in size and contour. Adrenals/Urinary Tract: Adrenal glands are normal. No signs of hydronephrosis. Nephrolithiasis. Large calculus in the RIGHT upper pole measuring approximately 1.6 x 1.0 cm. Smaller calculi in the RIGHT renal pelvis and a smaller interpolar calculus in an interpolar calyx measuring approximately 7 mm. Normal appearance of the LEFT kidney Stomach/Bowel: Signs of acute diverticulitis along the descending sigmoid junction. Rectal distension up to 8 cm.  Pancolonic diverticulosis. Gastric tube in situ similar to the prior study. No acute small bowel process. Vascular/Lymphatic:  Scattered atheromatous plaque of the abdominal aorta tracking in the iliac vessels. No adenopathy in the retroperitoneum. No adenopathy in the pelvis. Reproductive: No adnexal mass. Other: No free air.  No abscess. Musculoskeletal: Thinning of the sacrum and marked osteopenia with loss of muscular density, replacement with fatty atrophy. Bilateral breast implants. No acute musculoskeletal process. IMPRESSION: 1. Signs of acute diverticulitis along the descending sigmoid junction in the setting of pancolonic diverticulosis. No abscess or free air. 2. Fecal impaction 3. RIGHT-sided nephrolithiasis with large calculi as described. 4. Muscular atrophy and osteopenia likely related to primary diagnosis of ALS. Thinning of the sacrococcygeal region with respect of bone may relate to prior pressure ulceration. There is no stranding in the fat to suggest decubitus changes at this time in this location. Mild stranding is noted overlying the RIGHT ischium. 5. Aortic atherosclerosis. Aortic Atherosclerosis (ICD10-I70.0). Electronically Signed   By: GZetta BillsM.D.   On: 08/01/2020 15:21   DG Chest Port 1 View  Result Date: 08/04/2020 CLINICAL DATA:  Leukocytosis. EXAM: PORTABLE CHEST 1 VIEW COMPARISON:  Chest radiograph 08/31/2016. Lung bases from abdominal CT 08/01/2020 FINDINGS: Tracheostomy tube tip at the thoracic inlet. Right upper extremity PICC tip in the lower SVC. Chronic elevation of right hemidiaphragm. Adjacent compressive atelectasis/scarring at the right lung base. There is mild biapical pleuroparenchymal scarring. No acute or confluent airspace disease. No pleural effusion or pneumothorax. No acute osseous abnormalities are seen. IMPRESSION: 1. Chronic elevation of right hemidiaphragm with chronic atelectasis or scarring at the right lung base. 2. Tracheostomy tube tip at the thoracic inlet. 3. Right upper extremity PICC tip in the lower SVC. Electronically Signed   By: MKeith RakeM.D.   On:  08/04/2020 17:28   DG Abd Portable 2V  Result Date: 08/06/2020 CLINICAL DATA:  Diverticulitis.  Follow-up. EXAM: PORTABLE ABDOMEN - 2 VIEW COMPARISON:  CT abdomen done yesterday. FINDINGS: Previously administered contrast now present throughout the colon. Gastrostomy tube in place. No sign of bowel obstruction. No visible free  air. Curvature and chronic degenerative changes of the spine. IMPRESSION: Previously administered contrast now present throughout the colon. No sign of bowel obstruction or free air. Electronically Signed   By: Nelson Chimes M.D.   On: 08/06/2020 11:48   [place] EKG SITE RITE  Result Date: 08/02/2020 If [facility] image not attached, placement could not be confirmed due to current cardiac rhythm.   Consults: Treatment Team:  Abbie Sons, MD   Subjective:    Overnight Issues: Overnight no issues.  Remains on home vent without distress.Tearful today.Some abdominal discomfort but no frank pain.  Objective:  Vital signs for last 24 hours: Temp:  [97.9 F (36.6 C)-99 F (37.2 C)] 98.6 F (37 C) (09/02 1200) Pulse Rate:  [77-125] 109 (09/02 1200) Resp:  [13-25] 17 (09/02 1200) BP: (98-127)/(58-70) 117/67 (09/02 1200) SpO2:  [92 %-98 %] 92 % (09/02 1200) FiO2 (%):  [28 %] 28 % (09/02 1200) Weight:  [73.4 kg] 73.4 kg (09/02 0453)  Hemodynamic parameters for last 24 hours:    Intake/Output from previous day: 09/01 0701 - 09/02 0700 In: 1082.2 [I.V.:300; NG/GT:148.7; IV Piggyback:543.6] Out: 525 [Urine:525]  Intake/Output this shift: Total I/O In: 223.6 [IV Piggyback:223.6] Out: 426 [Urine:425; Stool:1]  Vent settings for last 24 hours: Vent Mode: AC FiO2 (%):  [28 %] 28 % Set Rate:  [12 bmp] 12 bmp Vt Set:  [450 mL] 450 mL PEEP:  [5 cmH20] 5 cmH20  Physical Exam:  GENERAL: Chronically ill appearing female, laying in bed, on chronic vent via Trach, in NAD. Temporal wasting. NECK: Neck supple, Tracheostomy in place  PULMONARY: Clear to auscultation  bilaterally, no wheezing or rales, symmetrical CARDIOVASCULAR:Regular rate & rhythm, s1s2, no M/R/G  GASTROINTESTINAL: Soft,distended,+tenderness to LLQ, no guarding or rebound tenderness, BS+ x4  MUSCULOSKELETAL: No swelling, no clubbing, no edema. Diffuse muscle wasting. 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  No obvious rashes, lesions, or ulcerations   Assessment/Plan:  Acute SEPSIS due to acute DIVERTICULITIS Contained pneumoperitoneum Pseudomonas aeruginosa pneumonia  -Monitor fever curve  -Trend WBC 's, on upward trend -Pseudomonas aeruginosa is multidrug-resistant -Check with microbiology lab sensitivities updated -Patient will be placed on cefepime for pneumonia coverage -Flagyl for diverticulitis -Add antifungal due to contain pneumoperitoneum/perforation -General Surgery following, appreciate input ~ No need for surgical intervention at this time per Surgery -Pain control -PRN Antiemetics -Follow abdominal exam -Follow-up with general surgery with regards to the above -Patient's mother wants her to come home -Determining home antibiotic regimen   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  Discussed with patient's mother, Early Osmond, at length via telephone conversation today,she was updated in full.  Critical Care Total Time*: 109 Minutes   Ottie Glazier, M.D.  [facility]   08/12/2020  *Care during the described time interval was provided by me and/or other providers on the critical care team.  I have reviewed this patient's available data, including medical history, events of note, physical examination and test  results as part of my evaluation.  **This note was dictated using voice recognition software/Dragon.  Despite best efforts to proofread, errors can occur which can change the meaning.  Any change was purely unintentional.

## 2020-08-13 DIAGNOSIS — A419 Sepsis, unspecified organism: Secondary | ICD-10-CM

## 2020-08-13 MED ORDER — FREE WATER
90.0000 mL | Status: DC
  Administered 2020-08-13 (×2): 90 mL

## 2020-08-13 MED ORDER — CHLORHEXIDINE GLUCONATE CLOTH 2 % EX PADS: 6.0000 | MEDICATED_PAD | Freq: Every day | CUTANEOUS | 3 refills | Status: DC

## 2020-08-13 MED ORDER — OSMOLITE 1.5 CAL PO LIQD: 1000.0000 mL | ORAL | 0 refills | Status: DC

## 2020-08-13 MED ORDER — PROSOURCE TF PO LIQD: 45.0000 mL | Freq: Every day | ORAL | 3 refills | Status: DC

## 2020-08-13 MED ORDER — FREE WATER: 90.0000 mL | 0 refills | Status: DC

## 2020-08-13 MED ORDER — BACLOFEN 10 MG PO TABS: 10.0000 mg | ORAL_TABLET | Freq: Three times a day (TID) | ORAL | 0 refills | Status: DC

## 2020-08-13 NOTE — Discharge Summary (Signed)
Physician Discharge Summary  Patient ID: Brenda Anderson MRN: [id number] DOB/AGE: 51/09/1971 51 y.o.  Admit date: 08/01/2020 Discharge date: 08/13/2020  Admission Diagnoses:  "49 y.o.femalewith a history of ALS, PEG tube, tracheostomy brought in by nurse because of abdominal distention. Nurse notes that patient has not had large bowel movements recently but has been having small bowel movements. This morning noted that the patient had bloating and distention in the abdomen and seem to be tender to palpation. Patient is able to mouth responses to questions. Nurse mentions that 99.3 axillary temp this morning" Brenda Glad MD   Discharge Diagnoses:  Active Problems:   Septic shock (HCC)   Diverticulitis   Ventilator dependent (HCC)   Advanced care planning/counseling discussion   Goals of care, counseling/discussion   Palliative care by specialist   Discharged Condition: comfort  Hospital Course: Brenda Anderson is an 49 y.o. 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  Patient presented with sepsis noted to have diverticulitis with contained pneumoperitoneum.    "08/12/20-I had a lengthy goals of care discussion with patient at bedside today.  We reviewed her ongoing progressive chronic lung disease with Brenda Anderson as well as multiple comorbid conditions with concomitant recent complications including perforated viscus with pneumoperitoneum from diverticulitis.  Patient does report worsening clinical deterioration over some time now and after further discourse regarding her goals, she feels as if the medical therapy is at this point futile.  She does wish to go home and we discussed options including de-escalation of therapy and to focus more on comfort.  Patient does feel that at this time he would be more appropriate to provide her with pain medication as well as any supportive care including nourishment on comfort measures and to send her home.  I have also communicated all this with  her mother who agrees that this is appropriate and that we should respect patient's wishes.  This has been discussed with social worker Brenda Anderson will be in contact with mother as well as Brenda Anderson home health to accomodate patient. Goal status is DNR comfort care."  Brenda Anderson   Discharge Exam: Blood pressure 116/69, pulse 90, temperature 98.2 F (36.8 C), temperature source Oral, resp. rate (!) 24, height 5' 5.98" (1.676 m), weight 73.4 kg, SpO2 98 %.  HEENT: trach EOMI LUNGS: ronchi HEART: RRR ABD: SNT  Discharge Disposition: HOME/HOME HEALTH    Allergies as of 08/13/2020      Reactions   Ciprofloxacin Nausea Only, Nausea And Vomiting   Other    purfume   Ultram [tramadol Hcl]       Medication List    TAKE these medications   baclofen 10 MG tablet Commonly known as: LIORESAL Place 1 tablet (10 mg total) into feeding tube 3 (three) times daily. What changed:   how much to take  how to take this  when to take this   buPROPion 100 MG tablet Commonly known as: WELLBUTRIN Place 1 tablet into feeding tube 3 (three) times daily.   Chlorhexidine Gluconate Cloth 2 % Pads Apply 6 each topically daily.   citalopram 20 MG tablet Commonly known as: CELEXA Take 20 mg by mouth daily.   feeding supplement (OSMOLITE 1.5 CAL) Liqd Place 1,000 mLs into feeding tube daily.   feeding supplement (PROSource TF) liquid Place 45 mLs into feeding tube daily. Start taking on: August 14, 2020   free water Soln Place 90 mLs into feeding tube every 4 (four) hours.   Melatonin 1  MG/ML Liqd Place 8 mLs into feeding tube at bedtime.   montelukast 10 MG tablet Commonly known as: SINGULAIR Take 10 mg by mouth at bedtime.   polyethylene glycol 17 g packet Commonly known as: MIRALAX / GLYCOLAX Take 17 g by mouth daily as needed.   pregabalin 25 MG capsule Commonly known as: LYRICA TAKE 1 CAPSULE BY MOUTH EVERY DAY AS NEEDED FOR BURNING PAIN (G TUBE)   pregabalin 100 MG  capsule Commonly known as: LYRICA SMARTSIG:1 Capsule(s) Gastro Tube Twice Daily   Tylenol 325 MG tablet Generic drug: acetaminophen   Vitamin D3 125 MCG (5000 UT) Caps 1 SOFTGEL CAPSULE ONCE A DAY GTUBE        Signed: Elyn Anderson 08/13/2020, 4:51 PM

## 2020-08-13 NOTE — Progress Notes (Signed)
Nutrition Follow-up  DOCUMENTATION CODES:   Not applicable  INTERVENTION:  Continue Osmolite 1.5 Cal at 40 mL/hr per tube + PROSource TF 45 mL once daily per tube. Provides 1480 kcal, 71 grams of protein, 730 mL H2O daily.  Provide free water flush of 90 mL Q4hrs. This provides 1270 mL H2O daily including water in tube feeding.  NUTRITION DIAGNOSIS:   Inadequate oral intake related to inability to eat, dysphagia as evidenced by NPO status (reliance on tube feeds and free water flush via G-tube to meet calorie/protein/hydration needs.).  Ongoing.  GOAL:   Patient will meet greater than or equal to 90% of their needs  Met with TF regimen.  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  Discussed with RN and on rounds. Plan is for patient to discharge home today and transition to comfort care after discharge. Plan is to continue tube feeds. Called patient's mother Early Osmond and discussed tube feed change to fiber-free formula. Per surgeon patient to remain on fiber-free formula for 3 weeks before transitioning back to Jevity.  Medications reviewed.  Labs reviewed: CBG 118-128, Creatinine <0.3.  Patient not currently on free water flush. Discussed with MD and okay to initiate 90 mL Q4hrs.  Diet Order:   Diet Order            Diet NPO time specified  Diet effective now                EDUCATION NEEDS:   No education needs have been identified at this time  Skin:  Skin Assessment: Reviewed RN Assessment  Last BM:  08/05/2020 per chart  Height:   Ht Readings from Last 1 Encounters:  08/09/20 5' 5.98" (1.676 m)   Weight:   Wt Readings from Last 1 Encounters:  08/12/20 73.4 kg   BMI:  Body mass index is 26.13 kg/m.  Estimated Nutritional Needs:   Kcal:   1455  Protein:  70-85 grams  Fluid:  1.2-1.4 L/day  Jacklynn Barnacle, MS, RD, LDN Pager number available on Amion

## 2020-08-13 NOTE — TOC Progression Note (Signed)
Transition of Care [facility]) - Progression Note    Patient Details  Name: Brenda Anderson MRN: [id number] Date of Birth: 15-Dec-1970  [facility]) CM/SW Contact  [name], [place] Phone Number:[phone] 08/13/2020, 4:27 PM  Clinical Narrative:     CSW spoke with Felix Pacini, RD and she confirmed Osmolite 1.5 can be replaced with Osmolite 1.2 49ml/hr X 24 hr until [employer] can deliver the Osmolite 1.5 next week.       Expected Discharge Plan and Services           Expected Discharge Date: 08/13/20                                     Social Determinants of Health (SDOH) Interventions    Readmission Risk Interventions No flowsheet data found.

## 2020-08-13 NOTE — TOC Transition Note (Signed)
Transition of Care [facility]) - Progression Note    Patient Details  Name: Brenda Anderson MRN: [id number] Date of Birth: 1971-06-07  Transition of Care [facility]) CM/SW Contact  Brenda Anderson Phone Number: [phone] 08/13/2020, 5:39 PM  Clinical Narrative:     Patient d/c home with [facility] home health.  Genella Rife RN from [place] to transport with patient vis EMS. Patient's mother Ms. Brenda Anderson, took box of Osmolite 1.2. TOC consult complete.       Expected Discharge Plan and Services           Expected Discharge Date: 08/13/20                                     Social Determinants of Health (SDOH) Interventions    Readmission Risk Interventions No flowsheet data found.

## 2020-08-13 NOTE — Consult Note (Signed)
PHARMACY CONSULT NOTE - FOLLOW UP  Pharmacy Consult for Electrolyte Monitoring and Replacement   Recent Labs: Potassium (mmol/L)  Date Value  08/12/2020 4.0  04/01/2014 4.0   Magnesium (mg/dL)  Date Value  02/40/9735 2.4  05/07/2013 1.9   Calcium (mg/dL)  Date Value  32/99/2426 8.7 (L)   Calcium, Total (mg/dL)  Date Value  83/41/9622 9.2   Albumin (g/dL)  Date Value  29/79/8921 2.7 (L)  05/07/2013 1.7 (L)   Phosphorus (mg/dL)  Date Value  19/41/7408 3.5  05/07/2013 2.8   Sodium (mmol/L)  Date Value  08/12/2020 138  04/01/2014 141   Corrected Ca: 9.7 mg/dL  Assessment: 49 year old female with PMHx of ALS with chronic trach and vent dependent, G-tube admitted with sepsis and acute diverticulitis.TPN was started on 8/27 TPN. Tube feeds were resumed and TPN was stopped on 08/11/20  Goal of Therapy:  Electrolytes WNL  Plan:   No electrolyte replacement warranted for today  Re-check electrolytes in am 9/4  Brenda Anderson 08/13/2020 7:29 AM

## 2020-08-13 NOTE — Progress Notes (Signed)
Pt discharged home with home health nurse and EMS, report given. Pt continued on her home vent fio2 28%.

## 2022-01-10 ENCOUNTER — Telehealth: Payer: Self-pay

## 2022-01-10 NOTE — Telephone Encounter (Signed)
Call to schedule home palliative consult, no answer and mailbox full. Will attempt another call to schedule visit.

## 2022-01-16 ENCOUNTER — Telehealth: Payer: Self-pay | Admitting: Nurse Practitioner

## 2022-01-16 NOTE — Telephone Encounter (Signed)
Attempted to contact patient's mother Early Osmond, regarding the Palliative referral with no answer - left message with reason for call along with my name and call back number, requesting a return call to schedule visit.

## 2022-01-17 ENCOUNTER — Telehealth: Payer: Self-pay | Admitting: Nurse Practitioner

## 2022-01-17 NOTE — Telephone Encounter (Signed)
Rec'd return call from patient's mother Particia Nearing, regarding the Palliative referral/services.  After discussing this with her she was in agreement with scheduling visit.  I have scheduled a MyChart palliative Consult visit for 01/24/22 @ 2 PM

## 2022-01-19 ENCOUNTER — Telehealth: Payer: Medicare HMO | Admitting: Nurse Practitioner

## 2022-01-19 ENCOUNTER — Encounter: Payer: Self-pay | Admitting: Nurse Practitioner

## 2022-01-19 ENCOUNTER — Other Ambulatory Visit: Payer: Self-pay

## 2022-01-19 DIAGNOSIS — G1221 Amyotrophic lateral sclerosis: Secondary | ICD-10-CM

## 2022-01-19 DIAGNOSIS — Z515 Encounter for palliative care: Secondary | ICD-10-CM

## 2022-01-19 DIAGNOSIS — G894 Chronic pain syndrome: Secondary | ICD-10-CM

## 2022-01-19 DIAGNOSIS — R5381 Other malaise: Secondary | ICD-10-CM

## 2022-01-19 NOTE — Progress Notes (Signed)
[facility] Consult Note Telephone: [phone]  Fax: [phone]   Date of encounter: 01/19/22 4:21 PM PATIENT NAME: Brenda Anderson [id number] [place] [address]   [phone] (home)  DOB: 09/25/1971 MRN: [id number] PRIMARY CARE PROVIDER:    [facility]  Brenda Dubin, Anderson [address] [place],  [place] [address] [phone]  RESPONSIBLE PARTY:    Contact Information     Name Relation Home Work Mobile   Brenda Anderson Anderson [phone]  [phone]   Brenda Anderson [phone]  [phone]       Due to the COVID-19 crisis, this visit was done via telemedicine from my office and it was initiated and consent by this patient and or family.  I connected with Brenda. Tamala Anderson, Brenda Anderson Anderson with Brenda Anderson OR [facility] on 01/19/22 by a video enabled telemedicine application and verified that I am speaking with the correct person.   I discussed the limitations of evaluation and management by telemedicine. The patient expressed understanding and agreed to proceed.  Palliative Care was asked to follow this patient by consultation request of  Brenda Anderson, Brenda Ide, Anderson to address advance care planning and complex medical decision making. This is the initial visit.                             ASSESSMENT AND PLAN / RECOMMENDATIONS:  Symptom Management/Plan: 1. Advance Care Planning; Wishes are to focus on minimizing hospitalizations. Brenda Anderson has been on hospice twice before, d/c due to stability. Discussed at length with Brenda Anderson can revisit in Brenda Anderson develops a wound, infection, requires increase in O2, vent changes, more difficult to ventilate, changes with labs. Discussed at length with Brenda Anderson and Brenda Anderson. Will follow close.   3. 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 Currently discussed taking tramadol  50mg  q8 hrs with tylenol in between, continues to have pain worsening with turning, positioning.  Recommended and discussed with [facility], will change to tramadol 50mg  q6hrs with tylenol. Will revisit with primary [facility] visit 01/25/2022. I called and updated Brenda Anderson, Brenda faxed [facility] new order  4. Goals of Care: Goals include to maximize quality of life and symptom management. Our advance care planning conversation included a discussion about:    The value and importance of advance care planning  Exploration of personal, cultural or spiritual beliefs that might influence medical decisions  Exploration of goals of care in the event of a sudden injury or illness  Identification and preparation of a healthcare agent  Review and updating or creation of an advance directive document.  5. Palliative care encounter; Palliative care encounter; Palliative medicine team will continue to support patient, patient's family, and medical team. Visit consisted of counseling and education dealing with the complex and emotionally intense issues of symptom management and palliative care in the setting of serious and potentially life-threatening illness  6. f/u 2 weeks for ongoing monitoring chronic disease progression, ongoing discussions complex medical decision making  Follow up Palliative Care Visit: Palliative care will continue to follow for complex medical decision making, advance care planning, and clarification of goals. Return 2 weeks or prn.  I spent 62 minutes providing this consultation. More than 50% of the time in this consultation was spent in counseling and care coordination. PPS: 30%  Chief Complaint: Initial palliative consult for  complex medical decision making  HISTORY OF PRESENT ILLNESS:  Brenda Anderson is a 51 y.o. year old female  with multiple medical problems including ALS, ventilator dependent, g-tube, trach, depression. I was able to connect with Brenda Tamala Anderson, Brenda Anderson  Anderson, primary caregiver with in home nurses. We talked about the last time Brenda Anderson was independent about 2000, discussed symptoms of ALS, disease progression functionally, we talked about bowel obstruction which led to total decompensation. We talked about being on hospice twice but d/c due to stability. We talked about ros, symptoms, pain at length. We talked about current pain regimen. We talked about Brenda Anderson ability to function which is blink her eyes to her computer to communicate. We talked about medical goals, life review, family dynamics, role pc in [place]. We talked about r/u pc visit. Therapeutic listening, emotional support provided. Questions answered.   History obtained from review of EMR, discussion with Brenda Tamala Julian, Brenda Trabue Anderson with  Brenda Anderson.  I reviewed available labs, medications, imaging, studies and related documents from the EMR.  Records reviewed and summarized above.   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/2016   PAST MEDICAL HISTORY:  Active Ambulatory Problems    Diagnosis Date Noted   ALS (amyotrophic lateral sclerosis) ([place]) 08/16/2016   Acute on chronic respiratory failure with hypercapnia (HCC) 08/20/2016   Septic shock ([place]) 08/01/2020   Diverticulitis    Ventilator dependent ([place])    Advanced care planning/counseling discussion    Goals of care, counseling/discussion    Palliative care by specialist    Resolved Ambulatory Problems    Diagnosis Date Noted   No Resolved Ambulatory Problems    Past Medical History:  Diagnosis Date   PEG (percutaneous endoscopic gastrostomy) status ([place])    Tracheostomy dependence ([place])    SOCIAL HX:  Social History   Tobacco Use   Smoking status: Former    Packs/day: 1.00    Years: 30.00    Pack years: 30.00    Types: Cigarettes    Quit date: 12/11/2012    Years since quitting: 9.1   Smokeless tobacco: Never  Substance Use Topics   Alcohol use: No   FAMILY HX: History reviewed. No pertinent family history.    ALLERGIES:  Allergies  Allergen Reactions   Ciprofloxacin Nausea Only and Nausea And Vomiting   Other     purfume   Ultram [Tramadol Hcl]      PERTINENT MEDICATIONS:  Outpatient Encounter Medications as of 01/19/2022  Medication Sig   acetaminophen (TYLENOL) 325 MG tablet    baclofen (LIORESAL) 10 MG tablet Place 1 tablet (10 mg total) into feeding tube 3 (three) times daily.   buPROPion (WELLBUTRIN) 100 MG tablet Place 1 tablet into feeding tube 3 (three) times daily.   Chlorhexidine Gluconate Cloth 2 % PADS Apply 6 each topically daily.   Cholecalciferol (VITAMIN D3) 125 MCG (5000 UT) CAPS 1 SOFTGEL CAPSULE ONCE A DAY GTUBE   citalopram (CELEXA) 20 MG tablet Take 20 mg by mouth daily.   Melatonin  1 MG/ML LIQD Place 8 mLs into feeding tube at bedtime.   montelukast (SINGULAIR) 10 MG tablet Take 10 mg by mouth at bedtime.    Nutritional Supplements (FEEDING SUPPLEMENT, OSMOLITE 1.5 CAL,) LIQD Place 1,000 mLs into feeding tube daily.   Nutritional Supplements (FEEDING SUPPLEMENT, PROSOURCE TF,) liquid Place 45 mLs into feeding tube daily.   polyethylene glycol (MIRALAX / GLYCOLAX) packet Take 17 g by mouth daily as needed.   pregabalin (LYRICA) 100 MG capsule SMARTSIG:1 Capsule(s) Gastro Tube Twice Daily   pregabalin (LYRICA) 25 MG capsule TAKE 1 CAPSULE BY MOUTH EVERY DAY AS NEEDED FOR BURNING PAIN (G TUBE)   Water For Irrigation, Sterile (FREE WATER) SOLN Place 90 mLs into feeding tube every 4 (four) hours.   No  facility-administered encounter medications on file as of 01/19/2022.   Thank you for the opportunity to participate in the care of Brenda. [place].  The palliative care team will continue to follow. Please call our office at [phone] if we can be of additional assistance.   Marquette Piontek Z Lecia Esperanza, Anderson ,

## 2022-01-24 ENCOUNTER — Telehealth: Payer: Medicare HMO | Admitting: Nurse Practitioner

## 2022-01-31 ENCOUNTER — Telehealth: Payer: Self-pay | Admitting: Nurse Practitioner

## 2022-01-31 NOTE — Telephone Encounter (Signed)
Ms. Brenda Blazing, [name] mother called to cancel appointment this am at 28. Ms. Brenda Anderson is requesting an afternoon visit, called, no answer, message left

## 2022-02-01 ENCOUNTER — Other Ambulatory Visit: Payer: Self-pay

## 2022-02-01 ENCOUNTER — Encounter: Payer: Self-pay | Admitting: Nurse Practitioner

## 2022-02-01 ENCOUNTER — Other Ambulatory Visit: Payer: Medicare HMO | Admitting: Nurse Practitioner

## 2022-02-01 DIAGNOSIS — R5381 Other malaise: Secondary | ICD-10-CM

## 2022-02-01 DIAGNOSIS — G1221 Amyotrophic lateral sclerosis: Secondary | ICD-10-CM

## 2022-02-01 DIAGNOSIS — G894 Chronic pain syndrome: Secondary | ICD-10-CM

## 2022-02-01 DIAGNOSIS — Z515 Encounter for palliative care: Secondary | ICD-10-CM

## 2022-02-01 NOTE — Progress Notes (Signed)
[facility] Consult Note Telephone: [phone]  Fax: [phone]    Date of encounter: 02/01/22 7:19 PM PATIENT NAME: Brenda Anderson [address] [place] [address]   [phone] (home)  DOB: 03-31-1971 MRN: [id number] PRIMARY CARE PROVIDER:    [facility]  RESPONSIBLE PARTY:    Contact Information     Name Relation Home Work Mobile   Rondel Baton Mother [phone]  [phone]   Daun Peacock [phone]  [phone]      I met face to face with patient and mother in home. Palliative Care was asked to follow this patient by consultation request of  [facility] to address advance care planning and complex medical decision making. This is a follow up visit.                                  ASSESSMENT AND PLAN / RECOMMENDATIONS:  Advance Care Planning/Goals of Care: Goals include to maximize quality of life and symptom management. Patient/health care surrogate gave his/her permission to discuss. Our advance care planning conversation included a discussion about:    The value and importance of advance care planning  Experiences with loved ones who have been seriously ill or have died  Exploration of personal, cultural or spiritual beliefs that might influence medical decisions  Exploration of goals of care in the event of a sudden injury or illness  Identification of a healthcare agent  Review and updating or creation of an  advance directive document . Decision not to resuscitate or to de-escalate disease focused treatments due to poor prognosis. CODE STATUS: Full code  Symptom Management/Plan: 1. Advance Care Planning; Wishes are to focus on minimizing hospitalizations. Brenda. Schemm has been on hospice twice before, d/c due to stability. Discussed at length with Brenda. Tamala Julian can revisit in Brenda. Audia develops a wound, infection, requires increase in O2, vent changes, more difficult to ventilate, changes  with labs. Discussed at length with Brenda Tamala Julian and [facility] NP. Will follow close.    3. 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 Currently discussed with determined the tramadol does help but not sustained, pain left hip down left leg, all over worsening with turning, positioning. Discussed with Brenda. Garen Lah RN and Brenda. Tamala Julian, mother will recommend increasing tramadol to 161m q6hrs which will maximize the tramadol can see if can be managed on this dose or require switiching to a stronger agent. Concern for fracture or oa left hip since fall but declines MRI as it would require Brenda. Krone to go by EMS, transport is very painful as well as ventilator accommodations would have to be in place prior to transport. Brenda. HTetideclines to go through that which would cause her suffering. We talked about muscle spasms currently on baclofen and lyrica. Baclofen is maximized. Will recommend increasing lyrica to 1539mbid and can continue to increase if needed. Reviewed with [facility] and in agreement [facility]. Questions answered.    4. Goals of Care: Goals include to maximize quality of life and symptom management. Our advance care planning conversation included a discussion about:    The value and importance of advance care planning  Exploration of personal, cultural or spiritual beliefs that might influence medical decisions  Exploration of goals of care in the event of a sudden injury  or illness  Identification and preparation of a healthcare agent  Review and updating or creation of an advance directive document.   5. Palliative care encounter; Palliative care encounter; Palliative medicine team will continue to support patient, patient's family, and medical team. Visit consisted of counseling and education dealing with the complex and emotionally intense issues of symptom management and  palliative care in the setting of serious and potentially life-threatening illness  Follow up Palliative Care Visit: Palliative care will continue to follow for complex medical decision making, advance care planning, and clarification of goals. Return 1 weeks with PC RN, then 4 weeks PC NP or prn.  I spent 65 minutes providing this consultation. More than 50% of the time in this consultation was spent in counseling and care coordination. PPS: 30% Chief Complaint: Follow up palliative consult for complex medical decision making  HISTORY OF PRESENT ILLNESS:  Brenda Anderson is a 51 y.o. year old female  with multiple medical problems including ALS, ventilator dependent, g-tube, trach, depression. I visited Brenda Anderson in her home with Alvis Lemmings RN and Brenda. Smith, mom. Brenda Anderson was lying in er bed as she is quadriplegic, appears comfortable, currently being ventilated through trach as well as tube feeding through g-tube. Brenda Anderson is able to participate in discussion though non-verbal, blinks eyes to questions as well as reads questions, conversations with talk to text by provider, then responds by using her visual computer. Reviewed ros, symptoms, bowels regimen effective, pain at length see plan. We talked about insomnia. Discussed sleep hygiene, watches TV, plays candy crush and games on her visual computer as well as loves the [employer] hockey team.  We talked about being on hospice twice but d/c due to stability though at present time, no new changes. We talked about medical goals with wishes for full code. We talked about Brenda Anderson wishes not to return to the hospital, she has not had good experiences. We talked about wishes for treatment in place and have [facility] for in home primary provider. We talked about caregiver schedules as Brenda. Tamala Julian is primary caregiver with limited nursing staff through [facility] [place] program. We talked about r/u pc visit 1 week by [facility] RN for re-evaluate of pain.  Discussed will try to get Brenda. Baade more comfortable with severe debility.  Therapeutic listening, emotional support provided. Questions answered.  Marland Kitchen   History obtained from review of EMR, discussion with Brenda Locurto by her computer blinking her eyes to write responses, Brenda. Tamala Julian, Brenda. Seaberry mother and Ova Freshwater I reviewed available labs, medications, imaging, studies and related documents from the EMR.  Records reviewed and summarized above.   ROS 10 point system reviewed with Brenda Grimm by her computer blinking her eyes to write responses, Brenda. Tamala Julian, Brenda. Scally mother and Psychologist, prison and probation services  Physical Exam: Constitutional: NAD General: frail appearing, severely debilitated, chronically ill female, non-verbal EYES: lids intact ENMT: oral mucous membranes moist, +trach;  CV: S1S2, RRR Pulmonary: +poor air movement; decrease bases, no increased work of breathing, no cough Abdomen: normo-active BS + 4 quadrants, soft and non tender, g-tube MSK: functional quadriplegic Skin: warm and dry Neuro:  +Severe  generalized weakness,  no cognitive impairment Psych: flat-anxious affect, A and O x 3 Thank you for the opportunity to participate in the care of Brenda. Diemer.  The palliative care team will continue to follow. Please call our office at [phone] if we can be of additional assistance.   Marylynne Keelin Ihor Gully, NP  COVID-19 PATIENT SCREENING TOOL Asked and negative response unless otherwise noted:   Have you had symptoms of covid, tested positive or been in contact with someone with symptoms/positive test in the past 5-10 days? NO

## 2022-03-30 ENCOUNTER — Other Ambulatory Visit: Payer: Medicare HMO | Admitting: Nurse Practitioner

## 2022-04-05 ENCOUNTER — Other Ambulatory Visit: Payer: Medicare HMO | Admitting: Nurse Practitioner

## 2022-04-07 ENCOUNTER — Encounter: Payer: Self-pay | Admitting: Nurse Practitioner

## 2022-04-07 ENCOUNTER — Other Ambulatory Visit: Payer: Medicare HMO | Admitting: Nurse Practitioner

## 2022-04-07 DIAGNOSIS — Z515 Encounter for palliative care: Secondary | ICD-10-CM

## 2022-04-07 DIAGNOSIS — R051 Acute cough: Secondary | ICD-10-CM

## 2022-04-07 DIAGNOSIS — R5381 Other malaise: Secondary | ICD-10-CM

## 2022-04-07 DIAGNOSIS — G1221 Amyotrophic lateral sclerosis: Secondary | ICD-10-CM

## 2022-04-07 DIAGNOSIS — G894 Chronic pain syndrome: Secondary | ICD-10-CM

## 2022-04-07 NOTE — Progress Notes (Signed)
? ? ?[facility] ?Community Palliative Care Consult Note ?Telephone: [phone]  ?Fax: [phone]  ? ? ?Date of encounter: 04/07/22 ?6:46 PM ?PATIENT NAME: Brenda Anderson ?[address] ?[place] [address]   ?[phone] (home)  ?DOB: 10/06/1971 ?MRN: [id number] ?PRIMARY CARE PROVIDER:    ?[facility] ? ?RESPONSIBLE PARTY:    ?Contact Information   ? ? Name Relation Home Work Mobile  ? Anderson,Brenda L Mother [phone]  [phone]  ? Anderson,Brenda Brother [phone]  [phone]  ? ?  ? ?I met face to face with patient and family in home. Palliative Care was asked to follow this patient by consultation request of  [facility] to address advance care planning and complex medical decision making. This is a follow up visit.                                  ?ASSESSMENT AND PLAN / RECOMMENDATIONS:  ?Symptom Management/Plan: ?1. Advance Care Planning; Wishes are to focus on minimizing hospitalizations. Brenda Anderson has been on hospice twice before, d/c due to stability. Discussed at length with Brenda Anderson can revisit in Brenda Anderson develops a wound, infection, requires increase in O2, vent changes, more difficult to ventilate, changes with labs. Discussed at length with Ms Anderson and [facility] NP. Will follow close.  ?  ?3. 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 Brenda Anderson endorses pain regimen is keeping her pain managed, continue to monitor pain scale, caution with turning due to fragility.  ?  ?4. Cough with yellow colored sputum, rhonchi per clinical presentation, discussed with [facility] NP will do an acute visit today with Brenda Anderson with Brenda Anderson by telemedicine to address.  ?  ?5. Palliative care encounter; Palliative care encounter; Palliative medicine team will continue to support patient, patient's family, and medical team. Visit consisted of counseling and education dealing with the  complex and emotionally intense issues of symptom management and palliative care in the setting of serious and potentially life-threatening illness ? ?Follow up Palliative Care Visit: Palliative care will continue to follow for complex medical decision making, advance care planning, and clarification of goals. Return 6 weeks or prn. ? ?I spent 62 minutes providing this consultation. More than 50% of the time in this consultation was spent in counseling and care coordination. ?PPS: 30% ? ?HOSPICE ELIGIBILITY/DIAGNOSIS: TBD ? ?Chief Complaint:  Follow up palliative consult for complex medical decision making ?HISTORY OF PRESENT ILLNESS:  Brenda Anderson is a 51 y.o. year old female  with multiple medical problems including ALS, ventilator dependent, g-tube, trach, depression. I visited Brenda Anderson in her home with [facility] RN and Brenda Anderson, mom. Ms Anderson was lying in her bed as she is quadriplegic, appears comfortable, currently being ventilated through trach as well as tube feeding through g-tube. Brenda Anderson is able to participate in discussion though non-verbal, blinks eyes to questions as well as reads questions, conversations with talk to text by provider, then responds by using her visual computer. Reviewed ros, symptoms, bowels regimen effective, pain at length which is currently controlled with hydrocodone. We talked about current cough with yellowish color sputum. Discussed will contact [facility] with update to see about cxr and abx which is Brenda Anderson wishes for treatment. Brenda Anderson endorses she has to go pick up Brenda Anderson's duonebs. We talked about medical goals with wishes for   full code. We talked about wishes for treatment in place and have [facility] for in home primary provider. We talked about caregiver schedules as Brenda Anderson is primary caregiver talked about [facility]. We talked about r/u pc visit 1 week by [facility] RN for re-evaluate of pain. Discussed will try to get Ms. Lazar more  comfortable with severe debility.  Therapeutic listening, emotional support provided. Questions answered.  I contacted [facility] and will do acute telemedicine visit to address cough, rhonchi, yellow sputum.  ? ?History obtained from review of EMR, discussion with Brenda Anderson mother with Brenda Anderson.  ?I reviewed available labs, medications, imaging, studies and related documents from the EMR.  Records reviewed and summarized above.  ?  ?ROS ?10 point system reviewed with Ms Shanker by her computer blinking her eyes to write responses, [name], Ms. Millikan mother and Alvis Lemmings RN ?  ?Physical Exam: ?Constitutional: NAD ?General: frail appearing, severely debilitated, chronically ill female, non-verbal ?EYES: lids intact ?ENMT: oral mucous membranes moist, +trach;  ?CV: S1S2, RRR ?Pulmonary: +poor air movement; decrease bases, no increased work of breathing, + cough, +rhonchi ?Abdomen: normo-active BS + 4 quadrants, soft and non tender, g-tube ?MSK: functional quadriplegic ?Skin: warm and dry ?Neuro:  +Severe  generalized weakness,  no cognitive impairment ?Psych: flat-anxious affect, A and O x 3 ? ?Thank you for the opportunity to participate in the care of Ms. Wassel.  The palliative care team will continue to follow. Please call our office at [phone] if we can be of additional assistance.  ? ?Minsa Weddington Z Rayce Brahmbhatt, NP  ? ?COVID-19 PATIENT SCREENING TOOL ?Asked and negative response unless otherwise noted:  ? ?Have you had symptoms of covid, tested positive or been in contact with someone with symptoms/positive test in the past 5-10 days? NO  ?

## 2022-05-22 ENCOUNTER — Ambulatory Visit: Payer: Medicare HMO | Admitting: Family Medicine

## 2022-06-01 ENCOUNTER — Telehealth: Payer: Self-pay | Admitting: Nurse Practitioner

## 2022-06-01 NOTE — Telephone Encounter (Signed)
I called Ms. Brenda Blazing, [name] mother to change f/u pc visit due to provider being out of town, message left with contact information

## 2022-06-06 ENCOUNTER — Telehealth: Payer: Self-pay

## 2022-06-07 ENCOUNTER — Other Ambulatory Visit: Payer: Medicare HMO | Admitting: Nurse Practitioner

## 2022-06-09 ENCOUNTER — Ambulatory Visit: Payer: Medicare HMO | Admitting: Family Medicine

## 2022-06-09 MED ORDER — FLUCONAZOLE 150 MG PO TABS: 150.0000 mg | ORAL_TABLET | ORAL | 0 refills | Status: AC

## 2022-06-09 NOTE — Progress Notes (Unsigned)
   Acute Office Visit  Subjective:     Patient ID: Brenda Anderson, female    DOB: 1971-12-01, 51 y.o.   MRN: [id number]  No chief complaint on file.   HPI Patient is in today for ***  ROS      Objective:    There were no vitals taken for this visit. {Vitals History (Optional):23777}  Physical Exam  No results found for any visits on 06/09/22.      Assessment & Plan:   Problem List Items Addressed This Visit   None Visit Diagnoses     Vaginal discharge    -  Primary       Meds ordered this encounter  Medications   fluconazole (DIFLUCAN) 150 MG tablet    Sig: Take 1 tablet (150 mg total) by mouth every 3 (three) days for 2 doses.    Dispense:  2 tablet    Refill:  0    No follow-ups on file.  Haydee Salter, MD

## 2022-06-21 ENCOUNTER — Telehealth: Payer: Self-pay

## 2022-06-22 ENCOUNTER — Telehealth: Payer: Medicare HMO | Admitting: Nurse Practitioner

## 2022-06-22 ENCOUNTER — Other Ambulatory Visit: Payer: Medicare HMO

## 2022-06-22 MED ORDER — LORAZEPAM 0.5 MG PO TABS: 0.5000 mg | ORAL_TABLET | Freq: Two times a day (BID) | ORAL | 1 refills | Status: AC | PRN

## 2022-06-22 NOTE — Progress Notes (Signed)
Spoke to nurse of pt. Ativan was req by patient. Dose confrimed. Med sent.  PDMP reviewed during this encounter.  Haydee Salter, MD

## 2022-06-26 ENCOUNTER — Telehealth: Payer: Medicare HMO | Admitting: Nurse Practitioner

## 2022-06-26 ENCOUNTER — Encounter: Payer: Self-pay | Admitting: Nurse Practitioner

## 2022-06-26 ENCOUNTER — Telehealth: Payer: Self-pay | Admitting: Family Medicine

## 2022-06-26 DIAGNOSIS — Z515 Encounter for palliative care: Secondary | ICD-10-CM

## 2022-06-26 DIAGNOSIS — G1221 Amyotrophic lateral sclerosis: Secondary | ICD-10-CM

## 2022-06-26 DIAGNOSIS — R5381 Other malaise: Secondary | ICD-10-CM

## 2022-06-26 DIAGNOSIS — G894 Chronic pain syndrome: Secondary | ICD-10-CM

## 2022-06-26 NOTE — Progress Notes (Signed)
[facility] Consult Note Telephone: [phone]  Fax: [phone]    Date of encounter: 06/26/22 8:26 PM PATIENT NAME: Brenda Anderson [address] [place] [address]   [phone] (home)  DOB: [birth year] MRN: [id number] PRIMARY CARE PROVIDER:    Dr Aggie Moats  RESPONSIBLE PARTY:    Contact Information     Name Relation Home Work Mobile   Smith,Judy L Mother [phone]  [phone]   Daun Peacock [phone]  [phone]     I met face to face with patient and family in home. Palliative Care was asked to follow this patient by consultation request of  [facility] to address advance care planning and complex medical decision making. This is a follow up visit.                                  ASSESSMENT AND PLAN / RECOMMENDATIONS:  Symptom Management/Plan: 1. Advance Care Planning; Wishes are to focus on minimizing hospitalizations. Brenda Anderson has been on hospice twice before, d/c due to stability. Discussed at length with Brenda Anderson can revisit in Ms. Heideman develops a wound, infection, requires increase in O2, vent changes, more difficult to ventilate, changes with labs. Will follow close.    3. 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 continue to monitor pain scale, caution with turning due to fragility.    4. Palliative care encounter; Palliative care encounter; Palliative medicine team will continue to support patient, patient's family, and medical team. Visit consisted of counseling and education dealing with the complex and emotionally intense issues of symptom management and palliative care in the setting of serious and potentially life-threatening illness   Follow up Palliative Care Visit: Palliative care will continue to follow for complex medical decision making, advance care planning, and clarification  of goals. Return 6 weeks or prn.   I spent 61 minutes providing this consultation. More than 50% of the time in this consultation was spent in counseling and care coordination. PPS: 30% Chief Complaint:  Follow up palliative consult for complex medical decision making HISTORY OF PRESENT ILLNESS:  Brenda Anderson is a 51 y.o. year old female  with multiple medical problems including ALS, ventilator dependent, g-tube, trach, depression. I talked with Ms. Tamala Anderson, Brenda Anderson's mother prior to visit with updates received, changed from [facility] to Dr Aggie Moats, Ms. Connolly does have a new hip fx noted on xrays and pending labs drawn. Currently taking hydrocodone for pain. I visited Brenda Anderson in her home with [facility]. Ms Anderson was lying in her bed as she is quadriplegic, appears comfortable, currently being ventilated through trach as well as tube feeding through g-tube. Brenda Anderson is able to participate in discussion though non-verbal, blinks eyes to questions as well as reads questions, typed questions on Brenda Anderson computer. Reviewed ros, symptoms, pain at length which is currently taking hydrocodone rating about a "4". We talked about new hip fx, discussed pelvic pain. We talked about positioning and caution in doing so. Quality of life, Therapeutic listening, emotional support provided. Questions answered.  Follow up visit scheduled, updated Dr Aggie Moats with concerns   History obtained from review of EMR, discussion with Brenda Anderson mother with Ms. Mille.  I reviewed available labs, medications, imaging, studies and related documents from the EMR.  Records reviewed and summarized  above.    ROS 10 point system reviewed with Ms Anderson by her computer blinking her eyes to write responses, Ms. Tamala Anderson, Brenda Anderson mother and Psychologist, prison and probation services   Physical Exam: Constitutional: NAD General: frail appearing, severely debilitated, chronically ill female, non-verbal EYES: lids intact ENMT: oral mucous membranes  moist, +trach;  CV: S1S2, RRR Pulmonary: +poor air movement; decrease bases, no increased work of breathing, + cough, +rhonchi Abdomen: normo-active BS + 4 quadrants, soft and non tender, g-tube MSK: functional quadriplegic Skin: warm and dry Neuro:  +Severe  generalized weakness,  no cognitive impairment Psych: flat-anxious affect, A and O x 3  Thank you for the opportunity to participate in the care of Brenda Anderson.  The palliative care team will continue to follow. Please call our office at [phone] if we can be of additional assistance.   Kadien Lineman Z Rihan Schueler, NP   COVID-19 PATIENT SCREENING TOOL Asked and negative response unless otherwise noted:   Have you had symptoms of covid, tested positive or been in contact with someone with symptoms/positive test in the past 5-10 days?  no

## 2022-06-27 ENCOUNTER — Telehealth: Payer: Medicare HMO | Admitting: Nurse Practitioner

## 2022-06-29 ENCOUNTER — Other Ambulatory Visit: Payer: Self-pay | Admitting: Family Medicine

## 2022-06-29 DIAGNOSIS — G8929 Other chronic pain: Secondary | ICD-10-CM

## 2022-06-29 LAB — HEMOGLOBIN A1C
Est. average glucose Bld gHb Est-mCnc: 88 mg/dL
Hgb A1c MFr Bld: 4.7 % — ABNORMAL LOW (ref 4.8–5.6)

## 2022-06-29 LAB — CBC WITH DIFFERENTIAL/PLATELET
Basophils Absolute: 0 10*3/uL (ref 0.0–0.2)
Basos: 1 %
EOS (ABSOLUTE): 0.2 10*3/uL (ref 0.0–0.4)
Eos: 2 %
Hematocrit: 37.8 % (ref 34.0–46.6)
Hemoglobin: 12.2 g/dL (ref 11.1–15.9)
Immature Grans (Abs): 0 10*3/uL (ref 0.0–0.1)
Immature Granulocytes: 1 %
Lymphocytes Absolute: 2.4 10*3/uL (ref 0.7–3.1)
Lymphs: 37 %
MCH: 32.6 pg (ref 26.6–33.0)
MCHC: 32.3 g/dL (ref 31.5–35.7)
MCV: 101 fL — ABNORMAL HIGH (ref 79–97)
Monocytes Absolute: 0.6 10*3/uL (ref 0.1–0.9)
Monocytes: 10 %
Neutrophils Absolute: 3.3 10*3/uL (ref 1.4–7.0)
Neutrophils: 49 %
Platelets: 218 10*3/uL (ref 150–450)
RBC: 3.74 x10E6/uL — ABNORMAL LOW (ref 3.77–5.28)
RDW: 12.5 % (ref 11.7–15.4)
WBC: 6.6 10*3/uL (ref 3.4–10.8)

## 2022-06-29 LAB — TSH: TSH: 1.16 u[IU]/mL (ref 0.450–4.500)

## 2022-06-29 LAB — VITAMIN D 1,25 DIHYDROXY
Vitamin D 1, 25 (OH)2 Total: 19 pg/mL — ABNORMAL LOW
Vitamin D2 1, 25 (OH)2: <10 pg/mL
Vitamin D3 1, 25 (OH)2: 19 pg/mL

## 2022-06-29 LAB — LIPID PANEL
Chol/HDL Ratio: 6.5 ratio — ABNORMAL HIGH (ref 0.0–4.4)
Cholesterol, Total: 209 mg/dL — ABNORMAL HIGH (ref 100–199)
HDL: 32 mg/dL — ABNORMAL LOW (ref >39)
LDL Chol Calc (NIH): 131 mg/dL — ABNORMAL HIGH (ref 0–99)
Triglycerides: 254 mg/dL — ABNORMAL HIGH (ref 0–149)
VLDL Cholesterol Cal: 46 mg/dL — ABNORMAL HIGH (ref 5–40)

## 2022-06-29 LAB — COMPREHENSIVE METABOLIC PANEL
ALT: 37 IU/L — ABNORMAL HIGH (ref 0–32)
AST: 29 IU/L (ref 0–40)
Albumin/Globulin Ratio: 1.3 (ref 1.2–2.2)
Albumin: 3.6 g/dL — ABNORMAL LOW (ref 3.8–4.9)
Alkaline Phosphatase: 96 IU/L (ref 44–121)
BUN/Creatinine Ratio: 114 — ABNORMAL HIGH (ref 9–23)
BUN: 25 mg/dL — ABNORMAL HIGH (ref 6–24)
Bilirubin Total: <0.2 mg/dL (ref 0.0–1.2)
CO2: 19 mmol/L — ABNORMAL LOW (ref 20–29)
Calcium: 8.9 mg/dL (ref 8.7–10.2)
Chloride: 108 mmol/L — ABNORMAL HIGH (ref 96–106)
Creatinine, Ser: 0.22 mg/dL — ABNORMAL LOW (ref 0.57–1.00)
Globulin, Total: 2.7 g/dL (ref 1.5–4.5)
Glucose: 140 mg/dL — ABNORMAL HIGH (ref 70–99)
Potassium: 4.5 mmol/L (ref 3.5–5.2)
Sodium: 142 mmol/L (ref 134–144)
Total Protein: 6.3 g/dL (ref 6.0–8.5)
eGFR: 137 mL/min/{1.73_m2} (ref >59)

## 2022-06-29 LAB — MAGNESIUM: Magnesium: 2.4 mg/dL — ABNORMAL HIGH (ref 1.6–2.3)

## 2022-06-29 MED ORDER — OXYCODONE-ACETAMINOPHEN 5-325 MG PO TABS: 1.0000 | ORAL_TABLET | ORAL | 0 refills | Status: AC | PRN

## 2022-06-29 NOTE — Telephone Encounter (Signed)
PDMP reviewed during this encounter.  XR showed broken hip.   Trial of percocet.  Haydee Salter, MD

## 2022-07-03 ENCOUNTER — Other Ambulatory Visit: Payer: Self-pay

## 2022-07-05 MED ORDER — LIDOCAINE 5 % EX PTCH: 1.0000 | MEDICATED_PATCH | CUTANEOUS | 0 refills | Status: DC

## 2022-07-05 NOTE — Addendum Note (Signed)
Addended by: Haydee Salter on: 07/05/2022 02:28 PM   Modules accepted: Orders

## 2022-07-05 NOTE — Telephone Encounter (Signed)
Addendum:  Lidoderm for chronic back pain sent.  Haydee Salter, MD

## 2022-07-19 ENCOUNTER — Other Ambulatory Visit: Payer: Self-pay | Admitting: Family Medicine

## 2022-07-20 ENCOUNTER — Telehealth: Payer: Self-pay | Admitting: Nurse Practitioner

## 2022-07-20 NOTE — Telephone Encounter (Signed)
Rec'd VM from mother Particia Nearing needing to reschedule the 07/24/22 Palliative f/u visit, called her back and left message letting her know that I could reschedule it to 08/07/22 @ 2:30 PM and requested a return call to confirm this.

## 2022-07-24 ENCOUNTER — Other Ambulatory Visit: Payer: Medicare HMO | Admitting: Nurse Practitioner

## 2022-07-30 ENCOUNTER — Other Ambulatory Visit: Payer: Self-pay | Admitting: Family Medicine

## 2022-08-03 ENCOUNTER — Ambulatory Visit: Payer: Medicare HMO | Admitting: Family Medicine

## 2022-08-03 MED ORDER — LIDOCAINE 5 % EX PTCH: 1.0000 | MEDICATED_PATCH | CUTANEOUS | 5 refills | Status: DC

## 2022-08-03 MED ORDER — CLOTRIMAZOLE-BETAMETHASONE 1-0.05 % EX CREA: 1.0000 | TOPICAL_CREAM | Freq: Every day | CUTANEOUS | 0 refills | Status: DC | PRN

## 2022-08-03 MED ORDER — OXYCODONE-ACETAMINOPHEN 5-325 MG PO TABS: 1.0000 | ORAL_TABLET | Freq: Four times a day (QID) | ORAL | 0 refills | Status: AC | PRN

## 2022-08-03 NOTE — Progress Notes (Unsigned)
   Established Patient Office Visit  Subjective   Patient ID: Brenda Anderson, female    DOB: December 28, 1970  Age: 51 y.o. MRN: [id number]  No chief complaint on file.   HPI  {History (Optional):23778}  ROS    Objective:     There were no vitals taken for this visit. {Vitals History (Optional):23777}  Physical Exam   No results found for any visits on 08/03/22.  {Labs (Optional):23779}  The ASCVD Risk score (Arnett DK, et al., 2019) failed to calculate for the following reasons:   The systolic blood pressure is missing    Assessment & Plan:   Problem List Items Addressed This Visit   None   No follow-ups on file.   PDMP reviewed during this encounter.   Haydee Salter, MD

## 2022-09-08 ENCOUNTER — Other Ambulatory Visit: Payer: Self-pay | Admitting: Family Medicine

## 2022-09-08 DIAGNOSIS — S72001D Fracture of unspecified part of neck of right femur, subsequent encounter for closed fracture with routine healing: Secondary | ICD-10-CM

## 2022-09-08 MED ORDER — OXYCODONE-ACETAMINOPHEN 5-325 MG PO TABS: 1.0000 | ORAL_TABLET | Freq: Four times a day (QID) | ORAL | 0 refills | Status: AC | PRN

## 2022-09-08 NOTE — Telephone Encounter (Signed)
PDMP reviewed during this encounter.  Med refilled. Percocet.  Elwin Mocha, MD

## 2022-09-27 ENCOUNTER — Encounter: Payer: Self-pay | Admitting: Nurse Practitioner

## 2022-09-27 ENCOUNTER — Other Ambulatory Visit: Payer: Medicare HMO | Admitting: Nurse Practitioner

## 2022-09-27 DIAGNOSIS — G894 Chronic pain syndrome: Secondary | ICD-10-CM

## 2022-09-27 DIAGNOSIS — G1221 Amyotrophic lateral sclerosis: Secondary | ICD-10-CM

## 2022-09-27 DIAGNOSIS — Z515 Encounter for palliative care: Secondary | ICD-10-CM

## 2022-09-27 DIAGNOSIS — R5381 Other malaise: Secondary | ICD-10-CM

## 2022-09-27 NOTE — Progress Notes (Signed)
[facility] Consult Note Telephone: [phone]  Fax: [phone]    Date of encounter: 09/27/22 12:29 PM PATIENT NAME: Brenda Anderson [address] [place] [address]   [phone] (home)  DOB: [birth year] MRN: [id number] PRIMARY CARE PROVIDER:    Dr Loistine Chance RESPONSIBLE PARTY:    Contact Information       Name Relation Home Work Mobile    Brenda Anderson,Brenda Anderson Mother [phone]   [phone]    Brenda Anderson [phone]   [phone]         I met face to face with patient and family in home. Palliative Care was asked to follow this patient by consultation request of  [facility] to address advance care planning and complex medical decision making. This is a follow up visit.                                   ASSESSMENT AND PLAN / RECOMMENDATIONS:  Symptom Management/Plan: 1. Advance Care Planning: full code, ongoing discussion.   PC NP will need to sign off with PC transition program will defer back to primary for needs, will have PC RN/PC SW continue to follow with new PC Module.   Previously under hospice services d/c due to stability  Wishes are to focus on minimizing hospitalizations. Brenda Anderson has been on hospice twice before and discharged due to stability. Can revisit if  Brenda Anderson develops a wound, infection, requires increase in O2, vent changes, more difficult to ventilate, and/or changes with labs.  Will follow close.    2. 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 Discussed Pain medication managed by Dr. Aggie Moats. Continue to monitor pain scale. Most of PC visit counseling, education, supportive visit    3. Palliative care encounter: Palliative medicine team will continue to support patient, patient's family, and medical team. Visit consisted of counseling and education dealing with the complex and emotionally intense issues of  symptom management and palliative care in the setting of serious and potentially life-threatening illness    I spent 60 minutes providing this consultation. More than 50% of the time in this consultation was spent in counseling and care coordination. PPS: 30%   Chief Complaint:  Follow up palliative consult for complex medical decision making  HISTORY OF PRESENT ILLNESS:  DALORES WEGER is a 51 y.o. year old female  with multiple medical problems including ALS, ventilator dependent, g-tube, trach, depression.Visit was conducted in Brenda Anderson home with her mom/caregiver Brenda. Brenda Anderson. Patient uses assistive technology using her eyes to communicate. Upon arrival Brenda Anderson was lying in her bed. 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 Brenda Anderson appears comfortable. and endorses that pain is managed. Brenda Anderson reports that she does not have any needs at time but expresses concern for her mom, Brenda Anderson. Spoke with Brenda Anderson about patient status and changes. Brenda Anderson states that patient is doing okay, pain is better managed and controlled. Reports that sleep is adequate and that patient continues to have regular bowel movements. Brenda Anderson reports that there has not been any new hospitalizations, wounds, or infections. Brenda Anderson reports that tolerating tube feedings, ros, medical goals reviewed with focus on comfort at home. Brenda Anderson uses a computer to communicate with her eyes. Brenda. Tamala Anderson, Brenda Anderson's mom  is sole caregiver with increase in stress, caregiver fatigue and burnout, she has a trip planned to the beach next week. Brenda Anderson endorse her son (Brenda Anderson's brother is trained in vent care but he has to work, maybe able to assist in the evenings). Brenda Anderson's father is also trained in vent and will be staying with Brenda Anderson during Brenda. Brenda Anderson's absence. Brenda Anderson endorses in home nursing through [facility] has been challenging  with frequent staffing changes, limited staff and there are multiple shifts during the week that are not covered. Coping strategies offered. Therapeutic listening, emotional support provided. Questions answered.  Explained at present time [facility] NP will need to sign off with PC transition program will defer back to primary for needs, will have PC RN/PC SW continue to follow with new PC Module. Brenda. Schlag demonstrated understanding and BrendaTamala Anderson verbalized understanding. History obtained from review of EMR, discussion with Brenda Anderson mother with Brenda. Anderson.  I reviewed available labs, medications, imaging, studies and related documents from the EMR.  Records reviewed and summarized above.    ROS: 10 point system reviewed with Brenda Anderson by her computer blinking her eyes to write responses, Brenda. Tamala Anderson, Brenda Anderson mother    Physical Exam: Constitutional: NAD General: frail appearing, severely debilitated, chronically ill female, non-verbal EYES: lids intact ENMT: oral mucous membranes moist, +trach;  CV: S1S2, RRR Pulmonary: +poor air movement; decrease bases, no increased work of breathing Abdomen: normo-active BS + 4 quadrants, soft and non tender, g-tube with continuous tube feed MSK: functional quadriplegic Skin: warm and dry Neuro:  +severe  generalized weakness,  no cognitive impairment Psych: flat affect, alert and oriented x 3   Thank you for the opportunity to participate in the care of Brenda Anderson.  The palliative care team will continue to follow. Please call our office at [phone] if we can be of additional assistance.   Eean Buss Ihor Gully, NP

## 2022-10-10 ENCOUNTER — Telehealth: Payer: Self-pay | Admitting: Family Medicine

## 2022-10-10 DIAGNOSIS — F419 Anxiety disorder, unspecified: Secondary | ICD-10-CM

## 2022-10-11 ENCOUNTER — Other Ambulatory Visit (INDEPENDENT_AMBULATORY_CARE_PROVIDER_SITE_OTHER): Payer: Self-pay | Admitting: Family Medicine

## 2022-10-11 DIAGNOSIS — S72001D Fracture of unspecified part of neck of right femur, subsequent encounter for closed fracture with routine healing: Secondary | ICD-10-CM

## 2022-10-11 MED ORDER — OXYCODONE-ACETAMINOPHEN 5-325 MG PO TABS: 1.0000 | ORAL_TABLET | Freq: Four times a day (QID) | ORAL | 0 refills | Status: DC | PRN

## 2022-10-11 NOTE — Telephone Encounter (Signed)
PDMP reviewed during this encounter. Oxycodone refilled  Brenda Anderson

## 2022-10-31 MED ORDER — AMPHETAMINE-DEXTROAMPHETAMINE 10 MG PO TABS: 10.0000 mg | ORAL_TABLET | Freq: Two times a day (BID) | ORAL | 0 refills | Status: DC

## 2022-10-31 NOTE — Telephone Encounter (Signed)
PDMP reviewed during this encounter.  Filled pain meds.  Haydee Salter, MD

## 2022-11-09 ENCOUNTER — Other Ambulatory Visit (INDEPENDENT_AMBULATORY_CARE_PROVIDER_SITE_OTHER): Payer: Medicare HMO | Admitting: Family Medicine

## 2022-11-09 DIAGNOSIS — S72001D Fracture of unspecified part of neck of right femur, subsequent encounter for closed fracture with routine healing: Secondary | ICD-10-CM

## 2022-11-09 DIAGNOSIS — T7840XD Allergy, unspecified, subsequent encounter: Secondary | ICD-10-CM

## 2022-11-09 MED ORDER — OXYCODONE-ACETAMINOPHEN 5-325 MG PO TABS: 1.0000 | ORAL_TABLET | Freq: Four times a day (QID) | ORAL | 0 refills | Status: DC | PRN

## 2022-11-09 MED ORDER — MONTELUKAST SODIUM 10 MG PO TABS: 10.0000 mg | ORAL_TABLET | Freq: Every day | ORAL | 0 refills | Status: DC

## 2022-11-09 NOTE — Telephone Encounter (Signed)
PDMP reviewed during this encounter.  Refilled percocet.  Haydee Salter, MD

## 2022-11-10 ENCOUNTER — Telehealth: Payer: Self-pay

## 2022-11-10 NOTE — Telephone Encounter (Signed)
1112 am.  Phone call made to [facility] Smith-mother to schedule a home visit.  No answer.  Message left requesting a call back.

## 2022-11-27 ENCOUNTER — Other Ambulatory Visit (INDEPENDENT_AMBULATORY_CARE_PROVIDER_SITE_OTHER): Payer: Medicare HMO | Admitting: Family Medicine

## 2022-11-27 DIAGNOSIS — R300 Dysuria: Secondary | ICD-10-CM

## 2022-11-27 MED ORDER — AMOXICILLIN-POT CLAVULANATE 250-62.5 MG/5ML PO SUSR: 500.0000 mg | Freq: Three times a day (TID) | ORAL | 0 refills | Status: AC

## 2022-11-27 NOTE — Progress Notes (Signed)
Nurse called about dysuria complaint. Will tx base don sx. Will need cult for next tx. Will discuss at upcoming visit.  Haydee Salter, MD

## 2022-11-28 ENCOUNTER — Telehealth: Payer: Self-pay

## 2022-11-28 NOTE — Telephone Encounter (Signed)
915 am.  Phone call to patient's mother Darel Hong.  Message left requesting a call back.

## 2022-11-30 ENCOUNTER — Ambulatory Visit: Payer: Medicare HMO | Admitting: Family Medicine

## 2022-12-08 ENCOUNTER — Other Ambulatory Visit: Payer: Self-pay | Admitting: Family Medicine

## 2022-12-08 DIAGNOSIS — S72001D Fracture of unspecified part of neck of right femur, subsequent encounter for closed fracture with routine healing: Secondary | ICD-10-CM

## 2022-12-08 MED ORDER — OXYCODONE-ACETAMINOPHEN 5-325 MG PO TABS: 1.0000 | ORAL_TABLET | Freq: Four times a day (QID) | ORAL | 0 refills | Status: AC | PRN

## 2022-12-08 NOTE — Progress Notes (Signed)
PDMP reviewed during this encounter.  Oxycodone for broken hip refilled.  Haydee Salter, MD

## 2022-12-11 ENCOUNTER — Other Ambulatory Visit: Payer: Self-pay | Admitting: Family Medicine

## 2022-12-11 DIAGNOSIS — T7840XD Allergy, unspecified, subsequent encounter: Secondary | ICD-10-CM

## 2022-12-15 ENCOUNTER — Telehealth: Payer: Self-pay

## 2022-12-15 NOTE — Telephone Encounter (Signed)
235 pm.  Messages received from patient's mother Bethena Roys.  Return call made but no answer.  Message left on VM requesting a call back.

## 2022-12-25 ENCOUNTER — Telehealth: Payer: Self-pay | Admitting: Family Medicine

## 2022-12-25 MED ORDER — CITALOPRAM HYDROBROMIDE 20 MG PO TABS: 20.0000 mg | ORAL_TABLET | Freq: Every day | ORAL | 2 refills | Status: AC

## 2022-12-25 NOTE — Telephone Encounter (Signed)
Med refill. October done. Refill 12/2022 Celexa done.  Elwin Mocha, MD

## 2022-12-26 ENCOUNTER — Telehealth: Payer: Self-pay

## 2022-12-26 NOTE — Telephone Encounter (Signed)
4 pm.  Incoming call from patient's mother Bethena Roys.  We further discussed patient's condition and wishes.  I will contact Dr. Audie Clear office for a hospice referral for patient.  They will be able to meet with patient and further discuss eligibility and patient's desire.  Mother agreeable with plan.  Phone call made to Dr. Audie Clear office to request hospice referral.  Fax number provided for [facility] Intake office.

## 2023-01-31 NOTE — Telephone Encounter (Signed)
d 

## 2023-02-09 DEATH — deceased
